# Patient Record
Sex: Male | Born: 1965
Health system: Southern US, Community
[De-identification: ages and names within clinical notes are randomized; demographics above are authoritative.]

## PROBLEM LIST (undated history)

## (undated) DIAGNOSIS — I4891 Unspecified atrial fibrillation: Secondary | ICD-10-CM

## (undated) DIAGNOSIS — M419 Scoliosis, unspecified: Secondary | ICD-10-CM

## (undated) DIAGNOSIS — F32A Depression, unspecified: Secondary | ICD-10-CM

## (undated) DIAGNOSIS — F1020 Alcohol dependence, uncomplicated: Secondary | ICD-10-CM

## (undated) DIAGNOSIS — K219 Gastro-esophageal reflux disease without esophagitis: Secondary | ICD-10-CM

## (undated) DIAGNOSIS — F4310 Post-traumatic stress disorder, unspecified: Secondary | ICD-10-CM

## (undated) DIAGNOSIS — F319 Bipolar disorder, unspecified: Secondary | ICD-10-CM

## (undated) DIAGNOSIS — J449 Chronic obstructive pulmonary disease, unspecified: Secondary | ICD-10-CM

## (undated) DIAGNOSIS — F329 Major depressive disorder, single episode, unspecified: Secondary | ICD-10-CM

## (undated) DIAGNOSIS — IMO0001 Reserved for inherently not codable concepts without codable children: Secondary | ICD-10-CM

## (undated) DIAGNOSIS — M1990 Unspecified osteoarthritis, unspecified site: Secondary | ICD-10-CM

## (undated) HISTORY — PX: APPENDECTOMY: SHX54

---

## 2001-01-05 ENCOUNTER — Encounter: Payer: Self-pay | Admitting: Emergency Medicine

## 2001-01-05 ENCOUNTER — Emergency Department (HOSPITAL_COMMUNITY): Admission: EM | Admit: 2001-01-05 | Discharge: 2001-01-05 | Payer: Self-pay | Admitting: Emergency Medicine

## 2005-02-14 ENCOUNTER — Ambulatory Visit: Payer: Self-pay | Admitting: Gastroenterology

## 2012-06-23 ENCOUNTER — Encounter (HOSPITAL_COMMUNITY): Payer: Self-pay

## 2012-06-23 ENCOUNTER — Emergency Department (HOSPITAL_COMMUNITY)
Admission: EM | Admit: 2012-06-23 | Discharge: 2012-06-23 | Disposition: A | Discharge status: Home / Self Care | Payer: Worker's Compensation | Attending: Emergency Medicine | Admitting: Emergency Medicine

## 2012-06-23 DIAGNOSIS — K219 Gastro-esophageal reflux disease without esophagitis: Secondary | ICD-10-CM | POA: Insufficient documentation

## 2012-06-23 DIAGNOSIS — F17200 Nicotine dependence, unspecified, uncomplicated: Secondary | ICD-10-CM | POA: Insufficient documentation

## 2012-06-23 DIAGNOSIS — X58XXXA Exposure to other specified factors, initial encounter: Secondary | ICD-10-CM | POA: Insufficient documentation

## 2012-06-23 DIAGNOSIS — F1010 Alcohol abuse, uncomplicated: Secondary | ICD-10-CM | POA: Insufficient documentation

## 2012-06-23 DIAGNOSIS — T782XXA Anaphylactic shock, unspecified, initial encounter: Secondary | ICD-10-CM | POA: Insufficient documentation

## 2012-06-23 DIAGNOSIS — Y9289 Other specified places as the place of occurrence of the external cause: Secondary | ICD-10-CM | POA: Insufficient documentation

## 2012-06-23 DIAGNOSIS — Z9089 Acquired absence of other organs: Secondary | ICD-10-CM | POA: Insufficient documentation

## 2012-06-23 DIAGNOSIS — Z8739 Personal history of other diseases of the musculoskeletal system and connective tissue: Secondary | ICD-10-CM | POA: Insufficient documentation

## 2012-06-23 HISTORY — DX: Scoliosis, unspecified: M41.9

## 2012-06-23 HISTORY — DX: Gastro-esophageal reflux disease without esophagitis: K21.9

## 2012-06-23 HISTORY — DX: Reserved for inherently not codable concepts without codable children: IMO0001

## 2012-06-23 HISTORY — DX: Alcohol dependence, uncomplicated: F10.20

## 2012-06-23 HISTORY — DX: Unspecified osteoarthritis, unspecified site: M19.90

## 2012-06-23 LAB — CBC
HCT: 52.7 % — ABNORMAL HIGH (ref 39.0–52.0)
MCH: 35.4 pg — ABNORMAL HIGH (ref 26.0–34.0)
MCV: 96.2 fL (ref 78.0–100.0)
Platelets: 268 10*3/uL (ref 150–400)
RBC: 5.48 MIL/uL (ref 4.22–5.81)
WBC: 17.5 10*3/uL — ABNORMAL HIGH (ref 4.0–10.5)

## 2012-06-23 LAB — BASIC METABOLIC PANEL
BUN: 19 mg/dL (ref 6–23)
CO2: 28 mEq/L (ref 19–32)
Calcium: 9.3 mg/dL (ref 8.4–10.5)
Chloride: 103 mEq/L (ref 96–112)
Creatinine, Ser: 1.29 mg/dL (ref 0.50–1.35)
Glucose, Bld: 138 mg/dL — ABNORMAL HIGH (ref 70–99)

## 2012-06-23 MED ORDER — METHYLPREDNISOLONE SODIUM SUCC 125 MG IJ SOLR
125.0000 mg | Freq: Once | INTRAMUSCULAR | Status: AC
  Administered 2012-06-23: 125 mg via INTRAVENOUS
  Filled 2012-06-23: qty 2

## 2012-06-23 MED ORDER — SODIUM CHLORIDE 0.9 % IV SOLN: INTRAVENOUS | Status: DC

## 2012-06-23 MED ORDER — DIPHENHYDRAMINE HCL 50 MG/ML IJ SOLN
25.0000 mg | Freq: Once | INTRAMUSCULAR | Status: AC
  Administered 2012-06-23: 25 mg via INTRAVENOUS

## 2012-06-23 MED ORDER — EPINEPHRINE 0.3 MG/0.3ML IJ DEVI
INTRAMUSCULAR | Status: AC
  Filled 2012-06-23: qty 0.3

## 2012-06-23 MED ORDER — FAMOTIDINE IN NACL 20-0.9 MG/50ML-% IV SOLN
20.0000 mg | Freq: Once | INTRAVENOUS | Status: AC
  Administered 2012-06-23: 20 mg via INTRAVENOUS
  Filled 2012-06-23: qty 50

## 2012-06-23 MED ORDER — DIPHENHYDRAMINE HCL 50 MG/ML IJ SOLN
INTRAMUSCULAR | Status: AC
  Filled 2012-06-23: qty 1

## 2012-06-23 MED ORDER — SODIUM CHLORIDE 0.9 % IV BOLUS (SEPSIS)
1000.0000 mL | Freq: Once | INTRAVENOUS | Status: AC
  Administered 2012-06-23: 1000 mL via INTRAVENOUS

## 2012-06-23 MED ORDER — EPINEPHRINE 0.3 MG/0.3ML IJ DEVI: 0.3000 mg | Freq: Once | INTRAMUSCULAR | Status: DC

## 2012-06-23 MED ORDER — MELOXICAM 7.5 MG PO TABS: 7.5000 mg | ORAL_TABLET | Freq: Every day | ORAL | Status: AC

## 2012-06-23 MED ORDER — EPINEPHRINE 0.3 MG/0.3ML IJ DEVI
0.3000 mg | Freq: Once | INTRAMUSCULAR | Status: AC
  Administered 2012-06-23: 0.3 mg via INTRAMUSCULAR

## 2012-06-23 MED ORDER — HYDROMORPHONE HCL PF 2 MG/ML IJ SOLN: 2.0000 mg | Freq: Once | INTRAMUSCULAR | Status: DC

## 2012-06-23 NOTE — ED Provider Notes (Signed)
Patient seen and evaluated with resident. Pertinent history and physical examination performed. Agree with initial assessment, evaluation and treatment initiated by resident. Face-to-face examination and review of evaluation findings were performed.  History includes- Pt hypotensive, coughing, nausea, and vomiting on admission. Symptoms started after eating a pork sandwich from a vending machine and taking Voltaren. No similar in past.  Physical Includes- patient is alert, and spitting clear saliva, able to talk in full sentences. No evident oral angioedema. Skin with generalized redness.  Initial treatment, ordered: Benadryl, Pepcid, and Solu-Medrol, and epinephrine  EKG-  Date: 06/23/2012  Rate: 86  Rhythm: normal sinus rhythm  QRS Axis: normal  Intervals: normal  ST/T Wave abnormalities: normal  Conduction Disutrbances: normal  Narrative Interpretation:   Old EKG Reviewed: none available  Disposition- Home with treatment for anaphylaxis including epinephrine pen, and Zyrtec Ongoing.    Flint Melter, MD 06/23/12 2019

## 2012-06-23 NOTE — ED Provider Notes (Signed)
History     CSN: [id number]  Arrival date & time 06/23/12  1444   First MD Initiated Contact with Patient 06/23/12 1504      Chief Complaint  Patient presents with  . Fatigue    (Consider location/radiation/quality/duration/timing/severity/associated sxs/prior treatment) HPI Comments: This is a 46 year old man with alcoholism who presents with anaphylaxis.  He was at work today in his usual state of health.  After eating his lunch and taking his diclofenac tablet around 13:00, he then became flushed and felt hot about 30 minutes later.  He went to the bathroom to have a BM and broke out into a drenching sweat and started feeling his throat tighten.  He called 911 and EMS brought him to the ED.  Here in the ED, he was given IM epinephrine, IV diphenhydramine, IV famotidine and IV methylprednisolone.  After this, he began feeling better and his throat tightness subsided.  Associated with this episode was "abdominal fullness" that prompted him to go the bathroom.  He denied chest pain, recent infections, sinus congestion, and worsening cough.  He has taken diclofenac off an on for a while now, but he reports a new exposure at work.  About a year ago, his work started using a new style of rubber gloves that are blue.  He started breaking out in "splotches" after using these gloves.  He started self medicating with Zyrtec which prevented these rashes.   Past Medical History  Diagnosis Date  . Arthritis   . Alcohol abuse   . GERD (gastroesophageal reflux disease)     Past Surgical History  Procedure Date  . Appendectomy     History reviewed. No pertinent family history.  History  Substance Use Topics  . Smoking status: Current Everyday Smoker -- 2.0 packs/day    Types: Cigarettes  . Smokeless tobacco: Not on file  . Alcohol Use: 15.0 oz/week    30 drink(s) per week     reports is an alcoholic      Review of Systems  All other systems reviewed and are  negative.    Allergies  Review of patient's allergies indicates no known allergies.  Home Medications   Current Outpatient Rx  Name Route Sig Dispense Refill  . DICLOFENAC SODIUM 75 MG PO TBEC Oral Take 75 mg by mouth 2 (two) times daily.      BP 97/64  Pulse 86  Temp 97.4 F (36.3 C) (Axillary)  Resp 18  SpO2 95%  Physical Exam  Constitutional: He appears well-developed and well-nourished. He appears distressed.  HENT:  Head: Normocephalic and atraumatic.  Nose: Nose normal.  Mouth/Throat: Uvula is midline, oropharynx is clear and moist and mucous membranes are normal.  Eyes: Conjunctivae and EOM are normal. Pupils are equal, round, and reactive to light.  Neck: Neck supple.  Cardiovascular: Normal rate and regular rhythm.   Pulmonary/Chest: Effort normal and breath sounds normal. He has no wheezes.  Abdominal: Soft. Normal appearance. He exhibits no mass. There is no hepatosplenomegaly. There is no tenderness.  Lymphadenopathy:    He has no cervical adenopathy.  Neurological: He is alert. He has normal strength. No cranial nerve deficit or sensory deficit.  Skin: Skin is intact.       Hot, erythematous    ED Course  Procedures (including critical care time)  16:20 - pt reevaluated, states he is feeling much better.  Throat tightness has resolved and rash is improving.  Lungs are clear.  17:52 - pt reevaluated.  Pt tired but feels well.  Rash now resolved.   21:00 - pt reevaluated.  Pt feels great.  Reviewed discharged instructions and medications.  Reviewed proper use of EpiPen.  Labs Reviewed  CBC - Abnormal; Notable for the following:    WBC 17.5 (*)     Hemoglobin 19.4 (*)     HCT 52.7 (*)     MCH 35.4 (*)     MCHC 36.8 (*)     All other components within normal limits  BASIC METABOLIC PANEL - Abnormal; Notable for the following:    Glucose, Bld 138 (*)     GFR calc non Af Amer 66 (*)     GFR calc Af Amer 76 (*)     All other components within normal  limits   No results found.   1. Anaphylactic shock     EKG Results: Rate:  86 PR:  164 QRS:  74 QTc:  469 EKG: normal EKG, normal sinus rhythm, there are no previous tracings available for comparison.   MDM  1.  Anaphylaxis:  Sudden onset hot flash, diaphoresis, and throat tightness best explained by anaphylactic reaction.  Septic shock would present more gradually with signs and symptoms of infection preceding.  Cardiogenic and hypovolemic shock would not explain the peripheral vasodilation.  Also, the patient has no history of cardiac disease and had not symptoms of chest pain preceding of during this event.  Heat stroke was also considered, but the sudden onset and sensation of throat tightness and dyspnea make this unlikely.  MSG reaction has many characteristics in common with this patient's presentation, but hypotension is not characteristic.  Patient was hemodynamically stable with no further intervention and safe for discharge.  We will have him continue taking Zytrec daily and switch from Voltaren to Mobic.  We will also provide him with an EpiPen rx.  Lollie Sails, MD 06/23/12 2106

## 2012-06-23 NOTE — ED Notes (Signed)
Patient denies any complaints at present.  Speaking in full sentences without difficulty.  No rash or hives noted.  Continue to observe patient - patient remain on ED monitors at present.

## 2012-06-23 NOTE — ED Notes (Signed)
States he had an allergic reaction at work today. Throat swollen, dizzy, nausea, diaphoretic. States he laid down on floor.

## 2012-06-23 NOTE — ED Notes (Addendum)
Pt with sudden onset of weakness, diaphoretic, stomach pain, hypotension, and mild nausea while at work, happened 30 mins after taking voltaren

## 2013-07-16 ENCOUNTER — Emergency Department (HOSPITAL_COMMUNITY)
Admission: EM | Admit: 2013-07-16 | Discharge: 2013-07-18 | Disposition: A | Discharge status: Other Acute Inpatient Hospital | Payer: 59 | Attending: Emergency Medicine | Admitting: Emergency Medicine

## 2013-07-16 ENCOUNTER — Encounter (HOSPITAL_COMMUNITY): Payer: Self-pay | Admitting: *Deleted

## 2013-07-16 DIAGNOSIS — F329 Major depressive disorder, single episode, unspecified: Secondary | ICD-10-CM | POA: Diagnosis present

## 2013-07-16 DIAGNOSIS — Z9289 Personal history of other medical treatment: Secondary | ICD-10-CM

## 2013-07-16 DIAGNOSIS — R062 Wheezing: Secondary | ICD-10-CM | POA: Insufficient documentation

## 2013-07-16 DIAGNOSIS — R05 Cough: Secondary | ICD-10-CM | POA: Insufficient documentation

## 2013-07-16 DIAGNOSIS — F32A Depression, unspecified: Secondary | ICD-10-CM

## 2013-07-16 DIAGNOSIS — Z79899 Other long term (current) drug therapy: Secondary | ICD-10-CM | POA: Insufficient documentation

## 2013-07-16 DIAGNOSIS — Z8719 Personal history of other diseases of the digestive system: Secondary | ICD-10-CM | POA: Insufficient documentation

## 2013-07-16 DIAGNOSIS — F17200 Nicotine dependence, unspecified, uncomplicated: Secondary | ICD-10-CM | POA: Insufficient documentation

## 2013-07-16 DIAGNOSIS — Z8739 Personal history of other diseases of the musculoskeletal system and connective tissue: Secondary | ICD-10-CM | POA: Insufficient documentation

## 2013-07-16 DIAGNOSIS — F1010 Alcohol abuse, uncomplicated: Secondary | ICD-10-CM

## 2013-07-16 DIAGNOSIS — F1020 Alcohol dependence, uncomplicated: Secondary | ICD-10-CM

## 2013-07-16 DIAGNOSIS — R059 Cough, unspecified: Secondary | ICD-10-CM | POA: Insufficient documentation

## 2013-07-16 DIAGNOSIS — R45851 Suicidal ideations: Secondary | ICD-10-CM

## 2013-07-16 MED ORDER — LORAZEPAM 1 MG PO TABS
1.0000 mg | ORAL_TABLET | Freq: Three times a day (TID) | ORAL | Status: DC | PRN
  Administered 2013-07-17: 1 mg via ORAL
  Filled 2013-07-16: qty 1

## 2013-07-16 MED ORDER — ALBUTEROL SULFATE HFA 108 (90 BASE) MCG/ACT IN AERS: 2.0000 | INHALATION_SPRAY | RESPIRATORY_TRACT | Status: DC | PRN

## 2013-07-16 MED ORDER — ZOLPIDEM TARTRATE 5 MG PO TABS
5.0000 mg | ORAL_TABLET | Freq: Every evening | ORAL | Status: DC | PRN
  Administered 2013-07-17: 5 mg via ORAL
  Filled 2013-07-16: qty 1

## 2013-07-16 MED ORDER — NICOTINE 21 MG/24HR TD PT24
21.0000 mg | MEDICATED_PATCH | Freq: Every day | TRANSDERMAL | Status: DC
  Administered 2013-07-17 – 2013-07-18 (×2): 21 mg via TRANSDERMAL
  Filled 2013-07-16 (×2): qty 1

## 2013-07-16 MED ORDER — ONDANSETRON HCL 4 MG PO TABS: 4.0000 mg | ORAL_TABLET | Freq: Three times a day (TID) | ORAL | Status: DC | PRN

## 2013-07-16 NOTE — ED Provider Notes (Signed)
CSN: [id number]     Arrival date & time 07/16/13  2330 History   First MD Initiated Contact with Patient 07/16/13 2338     Chief Complaint  Patient presents with  . Medical Clearance   (Consider location/radiation/quality/duration/timing/severity/associated sxs/prior Treatment) Patient is a 47 y.o. male presenting with mental health disorder. The history is provided by the patient. No language interpreter was used.  Mental Health Problem Presenting symptoms: depression and suicidal thoughts   Patient accompanied by:  Law enforcement Degree of incapacity (severity):  Moderate Progression:  Worsening Associated symptoms: no chest pain   Associated symptoms comment:  He presents voluntarily with [employer] for suicidal thoughts and alcohol abuse.    Past Medical History  Diagnosis Date  . Osteoarthritis   . Alcoholism /alcohol abuse   . GERD (gastroesophageal reflux disease)   . Scoliosis    Past Surgical History  Procedure Laterality Date  . Appendectomy     Family History  Problem Relation Age of Onset  . Cancer Mother     bladder  . Cancer Father     mesothelioma   History  Substance Use Topics  . Smoking status: Current Every Day Smoker -- 2.00 packs/day    Types: Cigarettes  . Smokeless tobacco: Not on file  . Alcohol Use: 15.0 oz/week    30 drink(s) per week     Comment: reports is an alcoholic    Review of Systems  Constitutional: Negative for fever and chills.  HENT: Negative.   Respiratory: Positive for cough and wheezing.   Cardiovascular: Negative.  Negative for chest pain.  Gastrointestinal: Negative.   Musculoskeletal: Negative.   Skin: Negative.   Neurological: Negative.   Psychiatric/Behavioral: Positive for suicidal ideas and dysphoric mood.    Allergies  Percocet  Home Medications   Current Outpatient Rx  Name  Route  Sig  Dispense  Refill  . cetirizine (ZYRTEC) 10 MG tablet   Oral   Take 10 mg by mouth daily.         Marland Kitchen EPINEPHrine  (EPI-PEN) 0.3 mg/0.3 mL DEVI   Intramuscular   Inject 0.3 mLs (0.3 mg total) into the muscle once.   1 Device   0    BP 126/79  Pulse 93  Temp(Src) 97.6 F (36.4 C) (Oral)  Resp 16  Ht 5\' 9"  (1.753 m)  Wt 220 lb (99.791 kg)  BMI 32.47 kg/m2  SpO2 96% Physical Exam  Constitutional: He is oriented to person, place, and time. He appears well-developed and well-nourished.  HENT:  Head: Normocephalic.  Neck: Normal range of motion. Neck supple.  Cardiovascular: Normal rate and regular rhythm.   Pulmonary/Chest: Effort normal. He has wheezes.  Abdominal: Soft. Bowel sounds are normal. There is no tenderness. There is no rebound and no guarding.  Musculoskeletal: Normal range of motion.  Neurological: He is alert and oriented to person, place, and time.  Skin: Skin is warm and dry. No rash noted.  Psychiatric: He has a normal mood and affect.    ED Course  Procedures (including critical care time) Labs Review Labs Reviewed  ACETAMINOPHEN LEVEL  CBC  COMPREHENSIVE METABOLIC PANEL  ETHANOL  SALICYLATE LEVEL  URINE RAPID DRUG SCREEN (HOSP PERFORMED)   Imaging Review No results found.  MDM  No diagnosis found. 1. Suicidal ideation 2. Substance abuse  He presents voluntarily for SI, alcohol dependence, requesting help for depression. [facility] to see, pending medical clearance.     Arnoldo Hooker, PA-C 07/17/13 0002

## 2013-07-16 NOTE — ED Notes (Addendum)
Pt presents w/ SI - has multiple plans for suicide to include running into traffic, states in November he lost his job and has been experiencing increasing stressors since then. Pt admits to ETOH tonight, pt brought in by [employer] x2 voluntary at present. Pt admits to homicidal ideations of his ex-girlfriend. Pt pleasant and cooperative on assessment.

## 2013-07-17 ENCOUNTER — Emergency Department (HOSPITAL_COMMUNITY): Payer: Self-pay

## 2013-07-17 DIAGNOSIS — X838XXA Intentional self-harm by other specified means, initial encounter: Secondary | ICD-10-CM

## 2013-07-17 DIAGNOSIS — F1020 Alcohol dependence, uncomplicated: Secondary | ICD-10-CM

## 2013-07-17 DIAGNOSIS — F1994 Other psychoactive substance use, unspecified with psychoactive substance-induced mood disorder: Secondary | ICD-10-CM

## 2013-07-17 LAB — COMPREHENSIVE METABOLIC PANEL
Albumin: 3.5 g/dL (ref 3.5–5.2)
Alkaline Phosphatase: 61 U/L (ref 39–117)
BUN: 8 mg/dL (ref 6–23)
Calcium: 9.2 mg/dL (ref 8.4–10.5)
Creatinine, Ser: 0.97 mg/dL (ref 0.50–1.35)
GFR calc Af Amer: >90 mL/min (ref >90)
Glucose, Bld: 105 mg/dL — ABNORMAL HIGH (ref 70–99)
Total Protein: 6.7 g/dL (ref 6.0–8.3)

## 2013-07-17 LAB — CBC
HCT: 46.5 % (ref 39.0–52.0)
Hemoglobin: 17.2 g/dL — ABNORMAL HIGH (ref 13.0–17.0)
MCH: 36.4 pg — ABNORMAL HIGH (ref 26.0–34.0)
MCHC: 37 g/dL — ABNORMAL HIGH (ref 30.0–36.0)
MCV: 98.5 fL (ref 78.0–100.0)
RDW: 12.9 % (ref 11.5–15.5)

## 2013-07-17 LAB — SALICYLATE LEVEL: Salicylate Lvl: <2 mg/dL — ABNORMAL LOW (ref 2.8–20.0)

## 2013-07-17 LAB — RAPID URINE DRUG SCREEN, HOSP PERFORMED
Barbiturates: NOT DETECTED
Benzodiazepines: NOT DETECTED
Cocaine: NOT DETECTED

## 2013-07-17 LAB — ETHANOL: Alcohol, Ethyl (B): 93 mg/dL — ABNORMAL HIGH (ref 0–11)

## 2013-07-17 LAB — ACETAMINOPHEN LEVEL: Acetaminophen (Tylenol), Serum: <15 ug/mL (ref 10–30)

## 2013-07-17 MED ORDER — ONDANSETRON 4 MG PO TBDP: 4.0000 mg | ORAL_TABLET | Freq: Four times a day (QID) | ORAL | Status: DC | PRN

## 2013-07-17 MED ORDER — CHLORDIAZEPOXIDE HCL 25 MG PO CAPS
25.0000 mg | ORAL_CAPSULE | Freq: Four times a day (QID) | ORAL | Status: DC
  Administered 2013-07-17: 25 mg via ORAL
  Filled 2013-07-17: qty 1

## 2013-07-17 MED ORDER — CHLORDIAZEPOXIDE HCL 25 MG PO CAPS: 25.0000 mg | ORAL_CAPSULE | Freq: Three times a day (TID) | ORAL | Status: DC

## 2013-07-17 MED ORDER — CHLORDIAZEPOXIDE HCL 25 MG PO CAPS: 25.0000 mg | ORAL_CAPSULE | Freq: Every day | ORAL | Status: DC

## 2013-07-17 MED ORDER — THIAMINE HCL 100 MG/ML IJ SOLN
100.0000 mg | Freq: Once | INTRAMUSCULAR | Status: AC
  Administered 2013-07-17: 100 mg via INTRAMUSCULAR
  Filled 2013-07-17: qty 2

## 2013-07-17 MED ORDER — VITAMIN B-1 100 MG PO TABS
100.0000 mg | ORAL_TABLET | Freq: Every day | ORAL | Status: DC
  Administered 2013-07-18: 10:00:00 100 mg via ORAL
  Filled 2013-07-17: qty 1

## 2013-07-17 MED ORDER — LOPERAMIDE HCL 2 MG PO CAPS: 2.0000 mg | ORAL_CAPSULE | ORAL | Status: DC | PRN

## 2013-07-17 MED ORDER — CHLORDIAZEPOXIDE HCL 25 MG PO CAPS: 25.0000 mg | ORAL_CAPSULE | Freq: Four times a day (QID) | ORAL | Status: DC | PRN

## 2013-07-17 MED ORDER — PANTOPRAZOLE SODIUM 40 MG PO TBEC
40.0000 mg | DELAYED_RELEASE_TABLET | Freq: Every day | ORAL | Status: DC
  Administered 2013-07-17 – 2013-07-18 (×2): 40 mg via ORAL
  Filled 2013-07-17 (×2): qty 1

## 2013-07-17 MED ORDER — GUAIFENESIN 100 MG/5ML PO SYRP
200.0000 mg | ORAL_SOLUTION | ORAL | Status: DC | PRN
  Administered 2013-07-17 – 2013-07-18 (×3): 200 mg via ORAL
  Filled 2013-07-17 (×2): qty 10

## 2013-07-17 MED ORDER — LORATADINE 10 MG PO TABS
10.0000 mg | ORAL_TABLET | Freq: Every day | ORAL | Status: DC
  Administered 2013-07-17 – 2013-07-18 (×2): 10 mg via ORAL
  Filled 2013-07-17 (×2): qty 1

## 2013-07-17 MED ORDER — CHLORDIAZEPOXIDE HCL 25 MG PO CAPS: 25.0000 mg | ORAL_CAPSULE | ORAL | Status: DC

## 2013-07-17 MED ORDER — HYDROXYZINE HCL 25 MG PO TABS: 25.0000 mg | ORAL_TABLET | Freq: Four times a day (QID) | ORAL | Status: DC | PRN

## 2013-07-17 MED ORDER — ADULT MULTIVITAMIN W/MINERALS CH
1.0000 | ORAL_TABLET | Freq: Every day | ORAL | Status: DC
  Administered 2013-07-17 – 2013-07-18 (×2): 1 via ORAL
  Filled 2013-07-17 (×2): qty 1

## 2013-07-17 MED ORDER — CHLORDIAZEPOXIDE HCL 25 MG PO CAPS
25.0000 mg | ORAL_CAPSULE | Freq: Once | ORAL | Status: AC
  Administered 2013-07-17: 25 mg via ORAL
  Filled 2013-07-17: qty 1

## 2013-07-17 NOTE — BH Assessment (Addendum)
Pt is effectively self-pay because workman's comp will not pay for alcohol detox. [facility] has exhausted funds for [employer] for August and [facility] will not authorize RTS without [facility] staff enrolling Pt in their Alpha system and [facility] staff, who have access to Alpha, is unavailable on weekends. Pt is pending bed at [facility].  Harlin Rain Ria Comment, [facility] Triage Specialist

## 2013-07-17 NOTE — Consult Note (Signed)
Telepsych Consultation   Reason for Consult: ED referral Referring Physician:  ED providers Dalton Molina is an 47 y.o. male.  Assessment: AXIS I:  alcohol dependence syndrome;SIMD;Suicide attempt AXIS II:  Deferred (child of alcoholic-?dependent personality) AXIS III:   Past Medical History  Diagnosis Date  . Osteoarthritis   . Alcoholism /alcohol abuse   . GERD (gastroesophageal reflux disease)   . Scoliosis    AXIS IV:  occupational problems AXIS V:  21-30 behavior considerably influenced by delusions or hallucinations OR serious impairment in judgment, communication OR inability to function in almost all areas  Plan:  Recommend psychiatric Inpatient admission when medically cleared.  Subjective:   Dalton Molina is a 47 y.o. male patient admitted with hx of alcohol dependence and suicide attempt.  HPI: See ED Provider note HPI Elements:   Severity:  tried to hang himself in Triage room.  Past Psychiatric History: Past Medical History  Diagnosis Date  . Osteoarthritis   . Alcoholism /alcohol abuse   . GERD (gastroesophageal reflux disease)   . Scoliosis     reports that he has been smoking Cigarettes.  He has been smoking about 2.00 packs per day. He does not have any smokeless tobacco history on file. He reports that he drinks about 15.0 ounces of alcohol per week. He reports that he does not use illicit drugs. Family History  Problem Relation Age of Onset  . Cancer Mother     bladder  . Cancer Father     mesothelioma         Allergies:   Allergies  Allergen Reactions  . Percocet [Oxycodone-Acetaminophen] Anaphylaxis and Rash    ACT Assessment Complete:  No:   Past Psychiatric History: Diagnosis:  Alcohol dependence syndrome ;Substance induced mood disorder;Suicide attempt;Family history of alcoholism  Hospitalizations: 2 prior attempts/Detox at [facility] recently  Outpatient Care:  None  Substance Abuse Care:  Detox only-uninsured and unable to get into  longer term treatment programs "I guess they were full"  Self-Mutilation:  None reported  Suicidal Attempts: x 3  Homicidal Behaviors:  None   Violent Behaviors:  None reported   Place of Residence:  Homeless Marital Status:  Never married/no children Employed/Unemployed:  Unemployed x 5 yrs-lost job of 15 yrs due to drinkinhg when his father died Education:  HS Family Supports:  Denies-says he is alone-"thet've all died now" Objective: Blood pressure 111/75, pulse 83, temperature 98.4 F (36.9 C), temperature source Oral, resp. rate 20, height 5\' 9"  (1.753 m), weight 99.791 kg (220 lb), SpO2 93.00%.Body mass index is 32.47 kg/(m^2). Results for orders placed during the hospital encounter of 07/16/13 (from the past 72 hour(s))  ACETAMINOPHEN LEVEL     Status: None   Collection Time    07/17/13 12:08 AM      Result Value Range   Acetaminophen (Tylenol), Serum <15.0  10 - 30 ug/mL   Comment:            THERAPEUTIC CONCENTRATIONS VARY     SIGNIFICANTLY. A RANGE OF 10-30     ug/mL MAY BE AN EFFECTIVE     CONCENTRATION FOR MANY PATIENTS.     HOWEVER, SOME ARE BEST TREATED     AT CONCENTRATIONS OUTSIDE THIS     RANGE.     ACETAMINOPHEN CONCENTRATIONS     >150 ug/mL AT 4 HOURS AFTER     INGESTION AND >50 ug/mL AT 12     HOURS AFTER INGESTION ARE     OFTEN ASSOCIATED WITH  TOXIC     REACTIONS.  CBC     Status: Abnormal   Collection Time    07/17/13 12:08 AM      Result Value Range   WBC 9.7  4.0 - 10.5 K/uL   RBC 4.72  4.22 - 5.81 MIL/uL   Hemoglobin 17.2 (*) 13.0 - 17.0 g/dL   HCT 16.1  09.6 - 04.5 %   MCV 98.5  78.0 - 100.0 fL   MCH 36.4 (*) 26.0 - 34.0 pg   MCHC 37.0 (*) 30.0 - 36.0 g/dL   RDW 40.9  81.1 - 91.4 %   Platelets 236  150 - 400 K/uL  COMPREHENSIVE METABOLIC PANEL     Status: Abnormal   Collection Time    07/17/13 12:08 AM      Result Value Range   Sodium 137  135 - 145 mEq/L   Potassium 3.3 (*) 3.5 - 5.1 mEq/L   Chloride 103  96 - 112 mEq/L   CO2 23  19 -  32 mEq/L   Glucose, Bld 105 (*) 70 - 99 mg/dL   BUN 8  6 - 23 mg/dL   Creatinine, Ser 7.82  0.50 - 1.35 mg/dL   Calcium 9.2  8.4 - 95.6 mg/dL   Total Protein 6.7  6.0 - 8.3 g/dL   Albumin 3.5  3.5 - 5.2 g/dL   AST 213 (*) 0 - 37 U/L   ALT 89 (*) 0 - 53 U/L   Alkaline Phosphatase 61  39 - 117 U/L   Total Bilirubin 0.5  0.3 - 1.2 mg/dL   GFR calc non Af Amer >90  >90 mL/min   GFR calc Af Amer >90  >90 mL/min   Comment: (NOTE)     The eGFR has been calculated using the CKD EPI equation.     This calculation has not been validated in all clinical situations.     eGFR's persistently <90 mL/min signify possible Chronic Kidney     Disease.  ETHANOL     Status: Abnormal   Collection Time    07/17/13 12:08 AM      Result Value Range   Alcohol, Ethyl (B) 93 (*) 0 - 11 mg/dL   Comment:            LOWEST DETECTABLE LIMIT FOR     SERUM ALCOHOL IS 11 mg/dL     FOR MEDICAL PURPOSES ONLY  SALICYLATE LEVEL     Status: Abnormal   Collection Time    07/17/13 12:08 AM      Result Value Range   Salicylate Lvl <2.0 (*) 2.8 - 20.0 mg/dL  URINE RAPID DRUG SCREEN (HOSP PERFORMED)     Status: None   Collection Time    07/17/13 12:43 AM      Result Value Range   Opiates NONE DETECTED  NONE DETECTED   Cocaine NONE DETECTED  NONE DETECTED   Benzodiazepines NONE DETECTED  NONE DETECTED   Amphetamines NONE DETECTED  NONE DETECTED   Tetrahydrocannabinol NONE DETECTED  NONE DETECTED   Barbiturates NONE DETECTED  NONE DETECTED   Comment:            DRUG SCREEN FOR MEDICAL PURPOSES     ONLY.  IF CONFIRMATION IS NEEDED     FOR ANY PURPOSE, NOTIFY LAB     WITHIN 5 DAYS.                LOWEST DETECTABLE LIMITS     FOR URINE  DRUG SCREEN     Drug Class       Cutoff (ng/mL)     Amphetamine      1000     Barbiturate      200     Benzodiazepine   200     Tricyclics       300     Opiates          300     Cocaine          300     THC              50   Labs are reviewed and are pertinent for ^ LFTs  consistent with alcoholic hepatitis/ETOH . 093.  Current Facility-Administered Medications  Medication Dose Route Frequency Provider Last Rate Last Dose  . albuterol (PROVENTIL HFA;VENTOLIN HFA) 108 (90 BASE) MCG/ACT inhaler 2 puff  2 puff Inhalation Q4H PRN Shari A Upstill, PA-C      . LORazepam (ATIVAN) tablet 1 mg  1 mg Oral Q8H PRN Shari A Upstill, PA-C   1 mg at 07/17/13 0200  . nicotine (NICODERM CQ - dosed in mg/24 hours) patch 21 mg  21 mg Transdermal Daily Shari A Upstill, PA-C      . ondansetron (ZOFRAN) tablet 4 mg  4 mg Oral Q8H PRN Shari A Upstill, PA-C      . zolpidem (AMBIEN) tablet 5 mg  5 mg Oral QHS PRN Shari A Upstill, PA-C   5 mg at 07/17/13 0200   Current Outpatient Prescriptions  Medication Sig Dispense Refill  . cetirizine (ZYRTEC) 10 MG tablet Take 10 mg by mouth daily as needed for allergies.       Marland Kitchen EPINEPHrine (EPI-PEN) 0.3 mg/0.3 mL DEVI Inject 0.3 mLs (0.3 mg total) into the muscle once.  1 Device  0    Psychiatric Specialty Exam:     Blood pressure 111/75, pulse 83, temperature 98.4 F (36.9 C), temperature source Oral, resp. rate 20, height 5\' 9"  (1.753 m), weight 99.791 kg (220 lb), SpO2 93.00%.Body mass index is 32.47 kg/(m^2).  General Appearance: Disheveled  Eye Solicitor::  Fair  Speech:  Clear and Coherent  Volume:  Decreased  Mood:  Depressed  Affect:  Congruent  Thought Process:  Coherent  Orientation:  Full (Time, Place, and Person)  Thought Content:  Obsessions/delusions all around alcohol and its consequences in his life  Suicidal Thoughts:  Yes.  with intent/plan  Homicidal Thoughts:  No  Memory:  Immediate;   Good  Judgement:  Poor  Insight:  Shallow  Psychomotor Activity:  NA  Concentration:  Poor  Recall:  Fair  Akathisia:  NA  Handed:  Right  AIMS (if indicated):     Assets:  Desire for Improvement  Sleep:   impaired due to ETOH   Treatment Plan Summary: ETOH detox followed by [facility] inpt program  Disposition:  Await  bed  Gannon Heinzman E 07/17/2013 3:52 AM

## 2013-07-17 NOTE — ED Notes (Signed)
Pt requesting ginger ale. Pt was pleasant and calm when speaking and showed no signs of distress.

## 2013-07-17 NOTE — BH Assessment (Addendum)
BHH Assessment Progress Note  Update @ 1058:  Received call from [facility] stating no beds available @ 1022.  Received call from [facility] requesting an EKG.  This relayed to pt's nurse.  However, received another call from [facility] stating pt declined by Dr. Carroll Sage due to acuity. No beds at [facility] per [place] @ 1127.  Called [facility] again @ 1157, no answer.  Referral faxed.  Called [facility] and beds available per [facility] @ 1158.  Referral faxed for review.  Called [facility] again and per [employer] [facility] @ 1159, no beds available.  Also, received call from [facility] and no beds available @ 1200.    Update: Called following facilities for placement consideration:  - [facility] - no answer @ 0820  - [facility] - no answer @ [phone]  [facility] - Left message @ [phone]  - [facility] - No beds per [facility] @ 0827  - [facility] - 0830 - Beds per [facility]le Bosworth - Referral faxed for review  - [facility] - May have beds after discharges and fax for review per [facility] @ 4782 - Referral faxed for review  - [facility] - Beds per [place] @ [phone] - Referral faxed for review  - [facility] - May have beds per Susie and fax for review @ [phone] - Referral faxed for review  - [facility] - Left message @ [phone]  TTS staff will continue to attempt to find placement for pt.

## 2013-07-17 NOTE — ED Notes (Signed)
Pt transferred from main ed, presents SI with attempt to hang self with cord in triage.  Reports he lost his job of 15 yrs and doesn't want to live anymore.  Pt reports he drinks liquor or beer all day.  Denies drug use.  Pt states diag. With Bipolar in the past, feeling hopeless.  Pt also reports he sees Elvis and UFO's.

## 2013-07-17 NOTE — ED Notes (Signed)
Discussed keeping the door open due to him trying to hang himself with a cord in triage. Stated understanding. Discussed suicidal thoughts and contracting for safety. Currently denies SI and does contract for safety.

## 2013-07-17 NOTE — Consult Note (Signed)
Patient is seen awake, alert and oriented x4.  He reports depressed mood and his affect is congruent.  He states he is here for detox from alcohol and cocaine.  Patient is emotional and tearful repeating" I lost everything"  Patient states he is here to get detox from alcohol and cocaine.  Patient is still endorsing SI and states " If I leave here without treatment I don't know what I will do to myself"  He denies HI/AVH but he also states" I do hear and see things when I am under the influence of something or when I am withdrawing"  We will continue to monitor patient, we will continue to detox him with our librium protocol.  For his c/o abdominal discomfort and gastric indigestion, we will start patient on Protonix and give him cough syrup for his dry hacking cough. We will seek for placement at any facility while we plan to admit him here when bed is available. Dahlia Byes   PMHNP-BC

## 2013-07-18 ENCOUNTER — Inpatient Hospital Stay (HOSPITAL_COMMUNITY)
Admission: EM | Admit: 2013-07-18 | Discharge: 2013-07-22 | DRG: 897 | Disposition: A | Discharge status: Home / Self Care | Payer: 59 | Source: Intra-hospital | Attending: Psychiatry | Admitting: Psychiatry

## 2013-07-18 ENCOUNTER — Encounter (HOSPITAL_COMMUNITY): Payer: Self-pay | Admitting: *Deleted

## 2013-07-18 ENCOUNTER — Encounter (HOSPITAL_COMMUNITY): Payer: Self-pay | Admitting: Registered Nurse

## 2013-07-18 DIAGNOSIS — F1010 Alcohol abuse, uncomplicated: Secondary | ICD-10-CM | POA: Diagnosis present

## 2013-07-18 DIAGNOSIS — K219 Gastro-esophageal reflux disease without esophagitis: Secondary | ICD-10-CM | POA: Diagnosis present

## 2013-07-18 DIAGNOSIS — Z79899 Other long term (current) drug therapy: Secondary | ICD-10-CM

## 2013-07-18 DIAGNOSIS — F329 Major depressive disorder, single episode, unspecified: Secondary | ICD-10-CM | POA: Diagnosis present

## 2013-07-18 DIAGNOSIS — F1020 Alcohol dependence, uncomplicated: Secondary | ICD-10-CM

## 2013-07-18 DIAGNOSIS — F10229 Alcohol dependence with intoxication, unspecified: Principal | ICD-10-CM | POA: Diagnosis present

## 2013-07-18 DIAGNOSIS — Z9289 Personal history of other medical treatment: Secondary | ICD-10-CM

## 2013-07-18 MED ORDER — NICOTINE 21 MG/24HR TD PT24
21.0000 mg | MEDICATED_PATCH | Freq: Every day | TRANSDERMAL | Status: DC
  Administered 2013-07-19 – 2013-07-22 (×4): 21 mg via TRANSDERMAL
  Filled 2013-07-18 (×8): qty 1

## 2013-07-18 MED ORDER — ONDANSETRON 4 MG PO TBDP: 4.0000 mg | ORAL_TABLET | Freq: Four times a day (QID) | ORAL | Status: AC | PRN

## 2013-07-18 MED ORDER — CHLORDIAZEPOXIDE HCL 25 MG PO CAPS
25.0000 mg | ORAL_CAPSULE | ORAL | Status: AC
  Administered 2013-07-21 (×2): 25 mg via ORAL
  Filled 2013-07-18 (×2): qty 1

## 2013-07-18 MED ORDER — VITAMIN B-1 100 MG PO TABS
100.0000 mg | ORAL_TABLET | Freq: Every day | ORAL | Status: DC
  Administered 2013-07-19 – 2013-07-22 (×4): 100 mg via ORAL
  Filled 2013-07-18 (×6): qty 1

## 2013-07-18 MED ORDER — ALBUTEROL SULFATE HFA 108 (90 BASE) MCG/ACT IN AERS: 2.0000 | INHALATION_SPRAY | RESPIRATORY_TRACT | Status: DC | PRN

## 2013-07-18 MED ORDER — MAGNESIUM HYDROXIDE 400 MG/5ML PO SUSP: 30.0000 mL | Freq: Every day | ORAL | Status: DC | PRN

## 2013-07-18 MED ORDER — CHLORDIAZEPOXIDE HCL 25 MG PO CAPS
25.0000 mg | ORAL_CAPSULE | Freq: Every day | ORAL | Status: AC
  Administered 2013-07-22: 25 mg via ORAL
  Filled 2013-07-18: qty 1

## 2013-07-18 MED ORDER — DOCUSATE SODIUM 100 MG PO CAPS
100.0000 mg | ORAL_CAPSULE | Freq: Two times a day (BID) | ORAL | Status: DC
  Administered 2013-07-18: 100 mg via ORAL
  Filled 2013-07-18 (×2): qty 1

## 2013-07-18 MED ORDER — HYDROXYZINE HCL 25 MG PO TABS: 25.0000 mg | ORAL_TABLET | Freq: Four times a day (QID) | ORAL | Status: AC | PRN

## 2013-07-18 MED ORDER — HYDROCORTISONE ACETATE 25 MG RE SUPP
25.0000 mg | Freq: Two times a day (BID) | RECTAL | Status: DC | PRN
  Administered 2013-07-18: 25 mg via RECTAL
  Filled 2013-07-18: qty 1

## 2013-07-18 MED ORDER — CHLORDIAZEPOXIDE HCL 25 MG PO CAPS
25.0000 mg | ORAL_CAPSULE | Freq: Once | ORAL | Status: AC
  Administered 2013-07-18: 25 mg via ORAL
  Filled 2013-07-18: qty 1

## 2013-07-18 MED ORDER — CHLORDIAZEPOXIDE HCL 25 MG PO CAPS
25.0000 mg | ORAL_CAPSULE | Freq: Three times a day (TID) | ORAL | Status: AC
  Administered 2013-07-20 (×3): 25 mg via ORAL
  Filled 2013-07-18 (×3): qty 1

## 2013-07-18 MED ORDER — CHLORDIAZEPOXIDE HCL 25 MG PO CAPS
25.0000 mg | ORAL_CAPSULE | Freq: Four times a day (QID) | ORAL | Status: AC
  Administered 2013-07-18 – 2013-07-19 (×6): 25 mg via ORAL
  Filled 2013-07-18 (×6): qty 1

## 2013-07-18 MED ORDER — CHLORDIAZEPOXIDE HCL 25 MG PO CAPS: 25.0000 mg | ORAL_CAPSULE | Freq: Four times a day (QID) | ORAL | Status: AC | PRN

## 2013-07-18 MED ORDER — ADULT MULTIVITAMIN W/MINERALS CH
1.0000 | ORAL_TABLET | Freq: Every day | ORAL | Status: DC
  Administered 2013-07-18 – 2013-07-22 (×5): 1 via ORAL
  Filled 2013-07-18 (×7): qty 1

## 2013-07-18 MED ORDER — TRAZODONE HCL 50 MG PO TABS
50.0000 mg | ORAL_TABLET | Freq: Every evening | ORAL | Status: DC | PRN
  Administered 2013-07-18 – 2013-07-21 (×4): 50 mg via ORAL
  Filled 2013-07-18 (×4): qty 1

## 2013-07-18 MED ORDER — ALUM & MAG HYDROXIDE-SIMETH 200-200-20 MG/5ML PO SUSP: 30.0000 mL | ORAL | Status: DC | PRN

## 2013-07-18 MED ORDER — THIAMINE HCL 100 MG/ML IJ SOLN: 100.0000 mg | Freq: Once | INTRAMUSCULAR | Status: DC

## 2013-07-18 MED ORDER — LOPERAMIDE HCL 2 MG PO CAPS: 2.0000 mg | ORAL_CAPSULE | ORAL | Status: AC | PRN

## 2013-07-18 NOTE — Tx Team (Signed)
Initial Interdisciplinary Treatment Plan  PATIENT STRENGTHS: (choose at least two) Average or above average intelligence Motivation for treatment/growth  PATIENT STRESSORS: Legal issue Substance abuse problems with his significant other    PROBLEM LIST: Problem List/Patient Goals Date to be addressed Date deferred Reason deferred Estimated date of resolution  Polysubstance abuse/dependence 07-18-13           Suicide ideation 07-18-13           homeless 07-18-13                              DISCHARGE CRITERIA:  Improved stabilization in mood, thinking, and/or behavior Reduction of life-threatening or endangering symptoms to within safe limits Withdrawal symptoms are absent or subacute and managed without 24-hour nursing intervention  PRELIMINARY DISCHARGE PLAN: Attend aftercare/continuing care group Attend 12-step recovery group Placement in alternative living arrangements  PATIENT/FAMIILY INVOLVEMENT: This treatment plan has been presented to and reviewed with the patient, Dalton Molina, and/or family member, .  The patient and family have been given the opportunity to ask questions and make suggestions.  Dalton Molina 07/18/2013, 5:51 PM

## 2013-07-18 NOTE — BHH Counselor (Signed)
Following placements have no beds per BHH attempts this AM:  -[facility] - no beds per Lynn @ 0959  -[facility] - no beds per Evelyn @ 0959  -[facility] - no beds per Susie @ 1000  -[facility] - no beds per Lisa @ 1001  -[facility] beds as of now   - [facility] - only male beds per Kim @ 1005  - [facility] - beds @ 1049 - Referral faxed for review  - [facility] - no beds @ 1007  - [facility] - no beds per Katie @ 1003  - [facility] - No beds per Brian @ 1008  - [facility] - Left message @ 1012  - [facility] - No beds per Beth @ 1034  - [facility] - no beds per Kathy @ 1037  - [facility] - 1057 - no beds per Amber   

## 2013-07-18 NOTE — [facility] Group Notes (Signed)
Adult Psychoeducational Group Note  Date:  07/18/2013 Time:  9:15 PM  Group Topic/Focus:  Wrap-Up Group:   The focus of this group is to help patients review their daily goal of treatment and discuss progress on daily workbooks.  Participation Level:  Active  Participation Quality:  Appropriate  Affect:  Appropriate  Cognitive:  Appropriate  Insight: Appropriate  Engagement in Group:  Engaged  Modes of Intervention:  Discussion  Additional Comments:  Cesare expressed that it was his first day here.  He stated that he wanted to get help before but waited until he lost everything.  He also said that he was glad to be in a place with other people who understood what he was going through.  Caroll Rancher A 07/18/2013, 9:15 PM

## 2013-07-18 NOTE — Progress Notes (Signed)
Patient ID: Dalton Molina, male   DOB: 01-Sep-1966, 47 y.o.   MRN: [id number] 07-18-13 @ nursing admission note: pt came to [facility] wanting help with polysubstance abuse/dependence problems. Two weeks ago he was in [facility] for the same issues. He has been using etoh and cocaine, he stated. His uds was negative. The etoh use is ongoing and the cocaine he was using with his now x-girlfriend.  His last drink was on fri and he admits he is a very heavy drinker.His CIWA was 1. He stated he was suicidal while he was impaired, but now denies any si/hi/av and contracted for his safety. His stressors are unemployed, financial issue, relationship issue, and he is a registered sex offender. His medical issues are congestion presently, an appendectomy in 1981, scoliosis and GERD. He has no complaints of pain, allergic to perocet and is a smoker a 2 ppd. He is wearing a patch presently.  This pt is single and has no children. Pt was escorted to the 500 hall and pt oriented to the unit.   Emergency contact Roger Felts at cell # [phone]

## 2013-07-18 NOTE — ED Notes (Signed)
Report called to [facility] RN at [facility]. Accepting Dr.Jonnalagadda to room 501-2. Transported via hospital security and mental health tech. Belongings sent with patient.

## 2013-07-18 NOTE — Consult Note (Signed)
  Follow up consult:  Face to face interview and consulted with Dr. Theotis Barrio  Patient states that he is feeling better. Patient continues to have complaints of depression.  "I have no family left and I just feel hopeless; like there is no where to go."  Patient states that he has been a little sick with cold/cough and is spitting out thick green .  We will continue to monitor patient and provide safety and stability.   Disposition:  Continue with current treatment plan for inpatient treatment.    Shuvon B. Rankin FNP-BC Family Nurse Practitioner, Board Certified

## 2013-07-19 DIAGNOSIS — F1994 Other psychoactive substance use, unspecified with psychoactive substance-induced mood disorder: Secondary | ICD-10-CM

## 2013-07-19 DIAGNOSIS — F10229 Alcohol dependence with intoxication, unspecified: Principal | ICD-10-CM

## 2013-07-19 NOTE — Tx Team (Signed)
  Interdisciplinary Treatment Plan Update   Date Reviewed:  07/19/2013  Time Reviewed:  12:20 PM  Progress in Treatment:   Attending groups: Yes Participating in groups: Yes Taking medication as prescribed: Yes  Tolerating medication: Yes Family/Significant other contact made: No  Patient understands diagnosis: Yes As evidenced by asking for help with alcohol dependence and depression Discussing patient identified problems/goals with staff: Yes  See initial plan Medical problems stabilized or resolved: Yes Denies suicidal/homicidal ideation: Yes In tx team Patient has not harmed self or others: Yes  For review of initial/current patient goals, please see plan of care.  Estimated Length of Stay:  3-5 days  Reason for Continuation of Hospitalization: Depression Medication stabilization Withdrawal symptoms  New Problems/Goals identified:  N/A  Discharge Plan or Barriers:   Hopes to get into a SA program  Additional Comments:  Patient admitted voluntarily, emergently from [facility] for alcohol detox treatment and alcohol related depression. Patient stated that he was called 911 from [place] parking lot where he left his truck and drinking a lot and has no resources any longer, and stated that he has been depressed and wants to die by walking in the traffic. He has been a sex offenders list x 1996, unable to stay on shelter either in [place] and [address] He has lost his job November 2013, sold his home and stayed in [place] along with his GF with crack addiction until two weeks ago. His GF left him and went to [facility] and he knows that she is not a good companion to him but say his judgement is poor.   Attendees:  Signature: Dr Elsie Saas, MD 07/19/2013 12:20 PM   Signature: Richelle Ito, LCSW 07/19/2013 12:20 PM  Signature: Verne Spurr, PA  07/19/2013 12:20 PM  Signature:  07/19/2013 12:20 PM  Signature: Liborio Nixon, RN 07/19/2013 12:20 PM  Signature:  07/19/2013 12:20 PM  Signature:    07/19/2013 12:20 PM  Signature:    Signature:    Signature:    Signature:    Signature:    Signature:      Scribe for Treatment Team:   Richelle Ito, LCSW  07/19/2013 12:20 PM

## 2013-07-19 NOTE — ED Provider Notes (Signed)
Medical screening examination/treatment/procedure(s) were performed by non-physician practitioner and as supervising physician I was immediately available for consultation/collaboration.    Christopher J. Pollina, MD 07/19/13 0727 

## 2013-07-19 NOTE — Progress Notes (Signed)
Patient ID: Dalton Molina, male   DOB: 03/28/1966, 47 y.o.   MRN: [id number] D)  Has been out and about on the hall this evening, attended group and seemed to appreciate being here.  Afterward spoke to him about how he felt, w/d sx, etc.  Stated was having some anxiety, is homeless, stated he really wanted help and has been through detox before.  States the program isn't long enough, that he just gets over craving and then he goes back to the same situation and falls back into the same pattern, would like long term program where he could get his life together but feeling hopeless, that it may never happen.  States has no family and could travel and go where available, states wants to change. A)  Encouraged him to speak with CM and ask what might be available to him.  Will continue to monitor for safety, continue POC R)  safety maintained.
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
## 2013-07-19 NOTE — Progress Notes (Signed)
Adult Psychoeducational Group Note  Date:  07/19/2013 Time:  10:00am  Group Topic/Focus:  Self Care:   The focus of this group is to help patients understand the importance of self-care in order to improve or restore emotional, physical, spiritual, interpersonal, and financial health.  Participation Level:  Active  Participation Quality:  Appropriate and Attentive  Affect:  Appropriate  Cognitive:  Alert and Appropriate  Insight: Appropriate  Engagement in Group:  Engaged  Modes of Intervention:  Discussion and Education  Additional Comments:  Pt attended and participated in group. When ask what self care meant to him pt stated taking care of bills keeping house clean and anything that should come first.  [name] 07/19/2013, 11:12 AM

## 2013-07-19 NOTE — Progress Notes (Signed)
D: Pt denies SI/HI/AVH. Pt reports depression 5/10. Pt present with flat affect. Depressed mood. No withdrawal symptoms observed by writer, none verbalized by pt. Pt stated that he fell in love with a girl four years ago and started drinking heavily and using cocaine. Pt blew over 20, 000 on alcohol and drugs. Pt is currently unemployed and homeless d/t reckless behaviors. A: Medications administered as ordered per MD. Medications reviewed with pt by Clinical research associate. Verbal support given. Pt encouraged to attend groups. . 15 minute checks performed for safety. R: Pt receptive to treatment. Pt remains safe at this time.
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
## 2013-07-20 DIAGNOSIS — F10229 Alcohol dependence with intoxication, unspecified: Secondary | ICD-10-CM

## 2013-07-20 DIAGNOSIS — F1994 Other psychoactive substance use, unspecified with psychoactive substance-induced mood disorder: Secondary | ICD-10-CM

## 2013-07-20 NOTE — Progress Notes (Signed)
D: Patient denies HI and A/V hallucinations and reports on and off thoughts of SI; patient reports sleep is well; reports appetite is good ; reports energy level is normal ; reports ability to pay attention is improving; rates depression as 5/10; rates hopelessness 6/10;   A: Monitored q 15 minutes; patient encouraged to attend groups; patient educated about medications; patient given medications per physician orders; patient encouraged to express feelings and/or concerns  R: Patient is appropriate and animated; patient is cooperative and pleasant but guarded; patient's interaction with staff and peers is appropriate;  patient is taking medications as prescribed and tolerating medications; patient is attending all groups and engaging

## 2013-07-20 NOTE — Progress Notes (Signed)
Pt. attended spirituality group focusing on topic of grief and loss facilitated by chaplain. Group facilitated awareness around loss in pt's lives.  Members normalized and explored loss in relationships with self and others, explored how history and family expectations shape our expectations of we walk with grief.   As group members responded to one another, facilitator assisted group in drawing out themes in their grief work.   Dalton Molina shared with the group about his past and recent losses due to alcoholism.  Identified loss of job, Astronomer, friends, significant relationship with girlfriend, whom is also addicted to substances.   Spoke with group about feeling of loneliness and isolation.   Dalton Molina  MDiv

## 2013-07-20 NOTE — Progress Notes (Signed)
The focus of this group is to educate the patient on the purpose and policies of crisis stabilization and provide a format to answer questions about their admission.  The group details unit policies and expectations of patients while admitted.  Patient attended 0900 nurse education orientation group.  Patient actively participated, appropriate affect, alert, appropriate insight and engagement.  Patient will work on goals for discharge today.  
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
## 2013-07-20 NOTE — Progress Notes (Signed)
Made NP Mashburn aware that potassium level was 3.3 on 07/17/13; no orders were given at this time.

## 2013-07-20 NOTE — [facility] Group Notes (Signed)
[facility] LCSW Group Therapy      Feelings About Diagnosis 1:15 - 2:30 PM         07/20/2013     Type of Therapy:  Group Therapy  Participation Level:  Active  Participation Quality:  Appropriate  Affect:  Appropriate  Cognitive:  Alert and Appropriate  Insight:  Developing/Improving and Engaged  Engagement in Therapy:  Developing/Improving and Engaged  Modes of Intervention:  Discussion, Education, Exploration, Problem-Solving, Rapport Building, Support  Summary of Progress/Problems:  Patient actively participated in group. Patient discussed past and present diagnosis and the effects it has had on  life.  Patient talked about family and society being judgmental and the stigma associated with having a mental health diagnosis.  Patient shared he is not so much concerned with a diagnosis as he is with not wanting to live.  He shared he has SI but contracted for safety.  Wynn Banker 07/20/2013

## 2013-07-20 NOTE — Progress Notes (Signed)
Adult Psychoeducational Group Note  Date:  07/20/2013 Time:  11:00am  Group Topic/Focus:  Recovery Goals:   The focus of this group is to identify appropriate goals for recovery and establish a plan to achieve them.  Participation Level:  Active  Participation Quality:  Appropriate and Attentive  Affect:  Appropriate  Cognitive:  Alert and Appropriate  Insight: Appropriate  Engagement in Group:  Engaged  Modes of Intervention:  Discussion and Education  Additional Comments:  Pt attended and participated in group. When ask what does recovery mean to you pt stated changing your behavior form destructive to constructive.  Shelly Bombard D 07/20/2013, 2:05 PM

## 2013-07-20 NOTE — Progress Notes (Signed)
Adult Psychoeducational Group Note  Date:  07/20/2013 Time:  10:17 PM  Group Topic/Focus:  Healthy Communication:   The focus of this group is to discuss communication, barriers to communication, as well as healthy ways to communicate with others.  Participation Level:  Active  Participation Quality:  Appropriate, Attentive, Sharing and Supportive  Affect:  Appropriate  Cognitive:  Alert, Appropriate and Oriented  Insight: Appropriate and Good  Engagement in Group:  Engaged and Supportive  Modes of Intervention:  Problem-solving  Additional Comments:  Dalton Molina shared with the group how he was able to meet some Dalton Molina in his group.  He stated that he was able to pick up something from each person to help him along the way.   Dalton Molina 07/20/2013, 10:17 PM

## 2013-07-21 DIAGNOSIS — F1010 Alcohol abuse, uncomplicated: Secondary | ICD-10-CM

## 2013-07-21 DIAGNOSIS — F1020 Alcohol dependence, uncomplicated: Secondary | ICD-10-CM

## 2013-07-21 DIAGNOSIS — F329 Major depressive disorder, single episode, unspecified: Secondary | ICD-10-CM

## 2013-07-21 MED ORDER — LORATADINE 10 MG PO TABS
10.0000 mg | ORAL_TABLET | Freq: Once | ORAL | Status: AC
  Administered 2013-07-21: 10 mg via ORAL
  Filled 2013-07-21 (×2): qty 1

## 2013-07-21 MED ORDER — FLUOXETINE HCL 20 MG PO CAPS
20.0000 mg | ORAL_CAPSULE | Freq: Every day | ORAL | Status: DC
  Administered 2013-07-21 – 2013-07-22 (×2): 20 mg via ORAL
  Filled 2013-07-21 (×3): qty 1
  Filled 2013-07-21: qty 14
  Filled 2013-07-21 (×2): qty 1

## 2013-07-21 MED ORDER — BENZONATATE 100 MG PO CAPS
100.0000 mg | ORAL_CAPSULE | Freq: Two times a day (BID) | ORAL | Status: DC
  Administered 2013-07-21 – 2013-07-22 (×2): 100 mg via ORAL
  Filled 2013-07-21 (×8): qty 1

## 2013-07-21 NOTE — [facility] Group Notes (Signed)
Adult Psychoeducational Group Note  Date:  07/21/2013 Time:  9:22 PM  Group Topic/Focus:  Wrap-Up Group:   The focus of this group is to help patients review their daily goal of treatment and discuss progress on daily workbooks.  Participation Level:  Active  Participation Quality:  Appropriate  Affect:  Appropriate  Cognitive:  Oriented  Insight: Improving  Engagement in Group:  Improving  Modes of Intervention:  Discussion and Support  Additional Comments:  Pt stated that his goal for today was to be nice, listen and respect others which he reported he did accomplish. He rated his day an 8 out of 10 (stating yesterday was an 4).  Dwain Sarna P 07/21/2013, 9:22 PM

## 2013-07-21 NOTE — Progress Notes (Signed)
D: Patient denies HI and A/V hallucinations and reports on and off thoughts of SI; patient reports sleep is well; reports appetite is good ; reports energy level is normal; reports ability to pay attention is good; rates depression as 6/10; rates hopelessness 7/10; rates anxiety as 9/10; complaints of hives on his back and PA Mashburn  A: Monitored q 15 minutes; patient encouraged to attend groups; patient educated about medications; patient given medications per physician orders; patient encouraged to express feelings and/or concerns  R: Patient is appropriate  ; patient's interaction with staff and peers is; patient was able to set goal to talk with staff 1:1 when having feelings of SI; patient is taking medications as prescribed and tolerating medications; patient is attending all groups

## 2013-07-21 NOTE — Tx Team (Signed)
Interdisciplinary Treatment Plan Update (Adult)  Date: 07/21/2013  Time Reviewed:  9:45 AM  Progress in Treatment: Attending groups: Yes Participating in groups:  Yes Taking medication as prescribed:  Yes Tolerating medication:  Yes Family/Significant othe contact made: CSW assessing  Patient understands diagnosis:  Yes Discussing patient identified problems/goals with staff:  Yes Medical problems stabilized or resolved:  Yes Denies suicidal/homicidal ideation: Yes Issues/concerns per patient self-inventory:  Yes Other:  New problem(s) identified: N/A  Discharge Plan or Barriers: CSW assessing for appropriate referrals.  Reason for Continuation of Hospitalization: Anxiety Depression Medication Stabilization  Comments: N/A  Estimated length of stay: 3-5 days  For review of initial/current patient goals, please see plan of care.  Attendees: Patient:     Family:     Physician:  Dr. Johnalagadda 07/21/2013 10:19 AM   Nursing:   Cynthia Goble, RN 07/21/2013 10:19 AM   Clinical Social Worker:  Jayvier Burgher Horton, LCSWA 07/21/2013 10:19 AM   Other: Neil Mashburn, PA 07/21/2013 10:19 AM   Other:  Carol Davis, RN 07/21/2013 10:19 AM   Other:  Quylle Hodnett, LCSW 07/21/2013 10:19 AM   Other:  Brittany Tyson, RN 07/21/2013 10:19 AM   Other: Jennifer Clark, RN case manager 07/21/2013 10:19 AM   Other:    Other:    Other:    Other:    Other:     Scribe for Treatment Team:   Horton, Japji Kok Nicole, 07/21/2013 10:19 AM 

## 2013-07-21 NOTE — [facility] Group Notes (Signed)
[facility] LCSW Group Therapy  07/21/2013  1:15 PM   Type of Therapy:  Group Therapy  Participation Level:  Minimal  Participation Quality:Minimal   Affect:  Appropriate, Flat and Depressed  Cognitive:  Alert and Appropriate  Insight:  Developing/Improving and Engaged  Engagement in Therapy:  Developing/Improving and Engaged  Modes of Intervention:  Clarification, Confrontation, Discussion, Education, Exploration, Limit-setting, Orientation, Problem-solving, Rapport Building, Dance movement psychotherapist, Socialization and Support  Summary of Progress/Problems: The topic for group today was emotional regulation.  This group focused on both positive and negative emotion identification and allowed group members to process ways to identify feelings, regulate negative emotions, and find healthy ways to manage internal/external emotions. Group members were asked to reflect on a time when their reaction to an emotion led to a negative outcome and explored how alternative responses using emotion regulation would have benefited them. Group members were also asked to discuss a time when emotion regulation was utilized when a negative emotion was experienced.  Pt was pulled out of group by the PA and the MD so was unable to participate in group discussion.    Reyes Ivan, LCSWA 07/21/2013 2:42 PM

## 2013-07-21 NOTE — Progress Notes (Signed)
[facility] MD Progress Note  07/21/2013 2:40 PM Dalton Molina  MRN:  [id number] Subjective:  Patient was not seen on 07/20/13 due to miscommunication among team. He complaints of feeling depressed and anxious and suicidal. He has stated that he has few moment of feeling better but predominantly feeling sad. He is worried about his disposition plans. He has been attending his groups and no disturbance of sleep and appetite. He rated depression 8/10 and anxiety 8/10. He does not feel safe out side the hospital and also concern about getting relapse with his alcoholism.   Diagnosis:   DSM5: Schizophrenia Disorders:   Obsessive-Compulsive Disorders:   Trauma-Stressor Disorders:   Substance/Addictive Disorders:  Alcohol Related Disorder - Severe (303.90) Depressive Disorders:  Major Depressive Disorder - Unspecified (296.20)  Axis I: Depressive Disorder NOS, Substance Induced Mood Disorder and Alcohol abuse vs dependence  ADL's:  Impaired  Sleep: Fair  Appetite:  Fair  Suicidal Ideation:  Endorses suicidal thoughts and contract for safety in hospital Homicidal Ideation:  none AEB (as evidenced by):  Psychiatric Specialty Exam: ROS  Blood pressure 122/79, pulse 105, temperature 98 F (36.7 C), temperature source Oral, resp. rate 25, height 5\' 9"  (1.753 m), weight 99.791 kg (220 lb), SpO2 96.00%.Body mass index is 32.47 kg/(m^2).  General Appearance: Disheveled and Guarded  Eye Contact::  Minimal  Speech:  Clear and Coherent and Slow  Volume:  Decreased  Mood:  Anxious, Depressed, Hopeless and Worthless  Affect:  Constricted, Depressed and Flat  Thought Process:  Goal Directed and Intact  Orientation:  Full (Time, Place, and Person)  Thought Content:  Rumination  Suicidal Thoughts:  Yes.  without intent/plan  Homicidal Thoughts:  No  Memory:  Immediate;   Fair  Judgement:  Fair  Insight:  Fair  Psychomotor Activity:  Psychomotor Retardation  Concentration:  Fair  Recall:  Fair   Akathisia:  NA  Handed:  Right  AIMS (if indicated):     Assets:  Communication Skills Desire for Improvement Physical Health Resilience  Sleep:  Number of Hours: 6.5   Current Medications: Current Facility-Administered Medications  Medication Dose Route Frequency Provider Last Rate Last Dose  . albuterol (PROVENTIL HFA;VENTOLIN HFA) 108 (90 BASE) MCG/ACT inhaler 2 puff  2 puff Inhalation Q4H PRN Court Joy, PA-C      . alum & mag hydroxide-simeth (MAALOX/MYLANTA) 200-200-20 MG/5ML suspension 30 mL  30 mL Oral Q4H PRN Court Joy, PA-C      . chlordiazePOXIDE (LIBRIUM) capsule 25 mg  25 mg Oral Q6H PRN Court Joy, PA-C      . chlordiazePOXIDE (LIBRIUM) capsule 25 mg  25 mg Oral BH-qamhs Court Joy, PA-C   25 mg at 07/21/13 0740   Followed by  . [START ON 07/22/2013] chlordiazePOXIDE (LIBRIUM) capsule 25 mg  25 mg Oral Daily Court Joy, PA-C      . hydrOXYzine (ATARAX/VISTARIL) tablet 25 mg  25 mg Oral Q6H PRN Court Joy, PA-C      . loperamide (IMODIUM) capsule 2-4 mg  2-4 mg Oral PRN Court Joy, PA-C      . magnesium hydroxide (MILK OF MAGNESIA) suspension 30 mL  30 mL Oral Daily PRN Court Joy, PA-C      . multivitamin with minerals tablet 1 tablet  1 tablet Oral Daily Court Joy, PA-C   1 tablet at 07/21/13 0740  . nicotine (NICODERM CQ - dosed in mg/24 hours) patch 21 mg  21 mg Transdermal  Daily Court Joy, PA-C   21 mg at 07/21/13 4098  . ondansetron (ZOFRAN-ODT) disintegrating tablet 4 mg  4 mg Oral Q6H PRN Court Joy, PA-C      . thiamine (B-1) injection 100 mg  100 mg Intramuscular Once Court Joy, PA-C      . thiamine (VITAMIN B-1) tablet 100 mg  100 mg Oral Daily Court Joy, PA-C   100 mg at 07/21/13 0740  . traZODone (DESYREL) tablet 50 mg  50 mg Oral QHS PRN,MR X 1 Court Joy, PA-C   50 mg at 07/20/13 2137    Lab Results: No results found for this or any previous visit (from the past 48 hour(s)).  Physical  Findings: AIMS: Facial and Oral Movements Muscles of Facial Expression: None, normal Lips and Perioral Area: None, normal Jaw: None, normal Tongue: None, normal,Extremity Movements Upper (arms, wrists, hands, fingers): None, normal Lower (legs, knees, ankles, toes): None, normal, Trunk Movements Neck, shoulders, hips: None, normal, Overall Severity Severity of abnormal movements (highest score from questions above): None, normal Incapacitation due to abnormal movements: None, normal Patient's awareness of abnormal movements (rate only patient's report): No Awareness, Dental Status Current problems with teeth and/or dentures?: No Does patient usually wear dentures?: No  CIWA:  CIWA-Ar Total: 0 COWS:  COWS Total Score: 0  Treatment Plan Summary: Daily contact with patient to assess and evaluate symptoms and progress in treatment Medication management  Plan: Start Fluoxetine 20 mg QD/depression Treatment Plan/Recommendations:  1. Admit for crisis management and stabilization. 2. Medication management to reduce current symptoms to base line and improve the patient's overall level of functioning. 3. Treat health problems as indicated. 4. Develop treatment plan to decrease risk of relapse upon discharge and to reduce the need for readmission. 5. Psycho-social education regarding relapse prevention and self care. 6. Health care follow up as needed for medical problems. 7. Restart home medications where appropriate. 8. Disposition plans in progress  Medical Decision Making Problem Points:  New problem, with no additional work-up planned (3), Review of last therapy session (1) and Review of psycho-social stressors (1) Data Points:  Review or order clinical lab tests (1) Review or order medicine tests (1) Review of medication regiment & side effects (2) Review of new medications or change in dosage (2)  I certify that inpatient services furnished can reasonably be expected to improve  the patient's condition.   Nehemiah Settle., MD 07/21/2013, 2:40 PM

## 2013-07-21 NOTE — Progress Notes (Signed)
NUTRITION ASSESSMENT  Pt identified as at risk on the Malnutrition Screen Tool  INTERVENTION: 1. Educated patient on the importance of nutrition and encouraged intake of food and beverages.   Goal: Pt to meet >/= 90% of their estimated nutrition needs.  Monitor:  PO intake  Assessment:  Patient admitted for etoh detox treatment and etoh related depression.  Patient denies any recent weight loss.  Reports good intake currently but poor prior to admit secondary to etoh and poor food choices.  Patient stated poor nutritional quality since losing job in November 2013.  At times would just eat a bag of doritoes or a can of ravioli for dinner.  During conversation, patient stated, "Is there a Dr. Parks Ranger in house."  "I am serious."  "I am just tired."  47 y.o. male  Height: Ht Readings from Last 1 Encounters:  07/18/13 5\' 9"  (1.753 m)    Weight: Wt Readings from Last 1 Encounters:  07/18/13 220 lb (99.791 kg)    Weight Hx: Wt Readings from Last 10 Encounters:  07/18/13 220 lb (99.791 kg)  07/16/13 220 lb (99.791 kg)    BMI:  Body mass index is 32.47 kg/(m^2). Pt meets criteria for obesity grade 1 based on current BMI.  Estimated Nutritional Needs: Kcal: 25-30 kcal/kg Protein: > 1 gram protein/kg Fluid: 1 ml/kcal  Diet Order: General Pt is also offered choice of unit snacks mid-morning and mid-afternoon.  Pt is eating as desired.   Lab results and medications reviewed.   Oran Rein, RD, LDN Clinical Inpatient Dietitian Pager:  [phone] Weekend and after hours pager:  [phone]

## 2013-07-21 NOTE — Progress Notes (Signed)
Recreation Therapy Notes  Date: 09.03.2014 Time: 3:00pm Location: [address] Dayroom   Group Topic: Communication, Team Building, Problem Solving  Goal Area(s) Addresses:  Patient will effectively work with peer towards shared goal.  Patient will identify skill used to make activity successful.  Patient will identify how skills used during activity can be used to reach post d/c goals.   Behavioral Response: Attentive, Appropriate, Engaged  Intervention: Problem Solving Activity  Activity: Life Boat. Patients were given a scenario about being on a sinking yacht. Patients were informed the yacht included 15 guest, 8 of which could be placed on the life boat, along with all group members. Individuals on guest list were of varying socioeconomic classes such as a Education officer, museum, Materials engineer, Midwife, Geophysical data processor.   Education: Customer service manager, Discharge Planning  Education Outcome: Acknowledges understanding   Clinical Observations/Feedback: Patient contributed to opening discussion, assisting peers with coming up with a group definition for personal development. Patient actively participated in group activity, voicing his opinion and debating with peers appropriately. Patient contributed to wrap up discussion identifying skills needed to guide decision making process, such as morals and skill set. Patient additionally identified communication as a skill necessary to make activities successful. Patient successfully related team work to personal development, as it was defined by group.    At approximately 3:10pm patient had coughing fit and stepped out of room. Patient left group covering his mouth, as if he had coughed up a significant amount of sputum. Patient could be heard coughing loudly as he exited group and walked down the hall to his room. Patient returned approximately 5 minutes later red faced and carrying a tissue. Patient continued to cough into tissue for remainder of group  session.   Marykay Lex Harry Bark, LRT/CTRS  Abdulhadi Stopa L 07/21/2013 4:30 PM

## 2013-07-21 NOTE — [facility] Group Notes (Signed)
[facility] LCSW Aftercare Discharge Planning Group Note   07/21/2013  8:45 AM  Participation Quality:  Alert and Appropriate   Mood/Affect:  Appropriate, Flat and Depressed  Depression Rating:  6  Anxiety Rating:  9  Thoughts of Suicide:  Pt denies SI/HI, but states that if he was drunk enough he'd drink cyanide   Will you contract for safety?   Yes  Current AVH:  Pt denies  Plan for Discharge/Comments:  Pt attended discharge planning group and actively participated in group.  CSW provided pt with today's workbook.  Pt states that he's not doing too good today.  Pt states that he lost his job in November and reports his life going downhill since then.  Pt states that he sleeps in his truck in [place].  Pt is scheduled to follow up at [facility] for further treatment.  No further needs voiced by pt at this time.    Transportation Means: Pt has access to transportation   Supports: No supports mentioned at this time  Dalton Molina, LCSWA 07/21/2013 9:51 AM

## 2013-07-22 MED ORDER — TRAZODONE HCL 50 MG PO TABS
50.0000 mg | ORAL_TABLET | Freq: Every day | ORAL | Status: DC
  Filled 2013-07-22: qty 14

## 2013-07-22 MED ORDER — FLUOXETINE HCL 20 MG PO CAPS: 20.0000 mg | ORAL_CAPSULE | Freq: Every day | ORAL | Status: DC

## 2013-07-22 MED ORDER — TRAZODONE HCL 50 MG PO TABS: 50.0000 mg | ORAL_TABLET | Freq: Every day | ORAL | Status: DC

## 2013-07-22 MED ORDER — EPINEPHRINE 0.3 MG/0.3ML IJ DEVI: 0.3000 mg | Freq: Once | INTRAMUSCULAR | Status: DC

## 2013-07-22 NOTE — [facility] Suicide Risk Assessment (Signed)
[facility] INPATIENT:  Family/Significant Other Suicide Prevention Education  Suicide Prevention Education:  Contact Attempts: Raelyn Number - friend [phone]), (name of family member/significant other) has been identified by the patient as the family member/significant other with whom the patient will be residing, and identified as the person(s) who will aid the patient in the event of a mental health crisis.  With written consent from the patient, two attempts were made to provide suicide prevention education, prior to and/or following the patient's discharge.  We were unsuccessful in providing suicide prevention education.  A suicide education pamphlet was given to the patient to share with family/significant other.  Date and time of first attempt: 07/21/13 @ 12:48 pm Date and time of second attempt: 07/22/13 @ 10:33 am  Horton, Salome Arnt 07/22/2013, 10:33 AM

## 2013-07-22 NOTE — Progress Notes (Signed)
[facility] Adult Case Management Discharge Plan :  Will you be returning to the same living situation after discharge: Yes,  pt was homeless prior to admission and will continue to be homeless.  Verbalizes that he will sleep in his truck due to being kicked out of the shelter.  CSW discussed alternate options but pt declined. At discharge, do you have transportation home?:Yes,  has own transportation, provided pt a bus pass to get back to his truck Do you have the ability to pay for your medications:Yes,  access to meds  Release of information consent forms completed and in the chart;  Patient's signature needed at discharge.  Patient to Follow up at: Follow-up Information   Follow up with [facility] On 07/29/2013. (Arrive at 8AM sharp for your screening for admission.  You must bring along at least 7 days of meds or they will not let you in.)    Contact information:   [address] [place], [place] [address] Phone: [phone]] [phone] Fax: [phone]      Patient denies SI/HI:   Yes,  denies SI/HI    Safety Planning and Suicide Prevention discussed:  Yes,  discussed with pt, unable to reach pt's friend so reviewed with pt.  See suicide prevention education note.   Carmina Miller 07/22/2013, 10:24 AM

## 2013-07-22 NOTE — [facility] Suicide Risk Assessment (Signed)
Suicide Risk Assessment  Discharge Assessment     Demographic Factors:  Male, Adolescent or young adult, Caucasian, Low socioeconomic status, Living alone and Unemployed  Mental Status Per Nursing Assessment::   On Admission:  NA (denies si/hi/av. )  Current Mental Status by Physician: NA  Loss Factors: Financial problems/change in socioeconomic status  Historical Factors: Prior suicide attempts, Family history of suicide, Family history of mental illness or substance abuse and Impulsivity  Risk Reduction Factors:   Religious beliefs about death and Positive therapeutic relationship  Continued Clinical Symptoms:  Depression:   Recent sense of peace/wellbeing Alcohol/Substance Abuse/Dependencies  Cognitive Features That Contribute To Risk:  Polarized thinking    Suicide Risk:  Minimal: No identifiable suicidal ideation.  Patients presenting with no risk factors but with morbid ruminations; may be classified as minimal risk based on the severity of the depressive symptoms  Discharge Diagnoses:   AXIS I:  Substance Induced Mood Disorder and Alcohol dependence AXIS II:  Cluster C Traits AXIS III:   Past Medical History  Diagnosis Date  . Osteoarthritis   . Alcoholism /alcohol abuse   . GERD (gastroesophageal reflux disease)   . Scoliosis    AXIS IV:  economic problems, housing problems, occupational problems, other psychosocial or environmental problems, problems related to social environment and problems with primary support group AXIS V:  51-60 moderate symptoms  Plan Of Care/Follow-up recommendations:  Activity:  As tolerated Diet:  Regular  Is patient on multiple antipsychotic therapies at discharge:  No   Has Patient had three or more failed trials of antipsychotic monotherapy by history:  No  Recommended Plan for Multiple Antipsychotic Therapies: NA  Dalton Molina,[name], MD 07/22/2013, 11:03 AM

## 2013-07-22 NOTE — Progress Notes (Signed)
Patient ID: Dalton Molina, male   DOB: [birth year], 47 y.o.   MRN: [id number] Writer reviewed pt discharge instructions with pt including medications, follow up care and crisis intervention. Pt acknowledged understanding of instructions but states that he would like to stay longer at [facility]. MD reports that pt is not approved for any additional days. Pt denies SI/HI and AVH. Pt mood and affect are appropriate to the situation. Writer returned pt belongings from locker, and the pt is released into his own care with a bus pass and directions to bus stop.

## 2013-07-22 NOTE — Progress Notes (Signed)
Patient ID: Dalton Molina, male   DOB: 01-24-1966, 47 y.o.   MRN: [id number] D: Patient stated he is doing well and believes his medication is working.  Pt mood/affect seemed depressed and sad. Pt rated depression and anxiety as 6 on a 0-10 scale.  Pt denies SI/HI/AVH and pain. Pt attended evening wrap up group and engaged in discussion. Pt denies any needs or concerns.  Cooperative with assessment. No acute distressed noted at this time.   A: Met with pt 1:1. Medications administered as prescribed. Writer encouraged pt to discuss feelings. Pt encouraged to come to staff with any questions or concerns. 15 minutes checks for safety.  R: Patient remains safe. He is complaint with medications and denies any adverse reaction. Continue current POC.

## 2013-07-22 NOTE — Progress Notes (Signed)
Patient did not attend 0900 nursing group. 

## 2013-07-22 NOTE — [facility] Group Notes (Signed)
[facility] LCSW Group Therapy  07/22/2013  1:15 PM   Type of Therapy:  Group Therapy  Participation Level:  Active  Participation Quality:  Appropriate and Attentive  Affect:  Appropriate  Cognitive:  Alert and Appropriate  Insight:  Developing/Improving and Engaged  Engagement in Therapy:  Developing/Improving and Engaged  Modes of Intervention:  Activity, Clarification, Confrontation, Discussion, Education, Exploration, Limit-setting, Orientation, Problem-solving, Rapport Building, Reality Testing, Socialization and Support  Summary of Progress/Problems: Patient was attentive and engaged with speaker from [employer].  Patient was attentive to speaker while they shared their story of dealing with mental health and overcoming it.  Patient expressed interest in their programs and services and received information on their agency.  Patient processed ways they can relate to the speaker.     Dalya Maselli Horton, LCSWA 07/22/2013 1:34 PM   

## 2013-07-23 ENCOUNTER — Encounter (HOSPITAL_COMMUNITY): Payer: Self-pay | Admitting: Emergency Medicine

## 2013-07-23 ENCOUNTER — Emergency Department (HOSPITAL_COMMUNITY)
Admission: EM | Admit: 2013-07-23 | Discharge: 2013-07-25 | Disposition: A | Discharge status: Home / Self Care | Payer: Self-pay | Attending: Emergency Medicine | Admitting: Emergency Medicine

## 2013-07-23 DIAGNOSIS — F17200 Nicotine dependence, unspecified, uncomplicated: Secondary | ICD-10-CM | POA: Insufficient documentation

## 2013-07-23 DIAGNOSIS — F329 Major depressive disorder, single episode, unspecified: Secondary | ICD-10-CM

## 2013-07-23 DIAGNOSIS — F10229 Alcohol dependence with intoxication, unspecified: Secondary | ICD-10-CM | POA: Insufficient documentation

## 2013-07-23 DIAGNOSIS — F32A Depression, unspecified: Secondary | ICD-10-CM

## 2013-07-23 DIAGNOSIS — Z8739 Personal history of other diseases of the musculoskeletal system and connective tissue: Secondary | ICD-10-CM | POA: Insufficient documentation

## 2013-07-23 DIAGNOSIS — Z8719 Personal history of other diseases of the digestive system: Secondary | ICD-10-CM | POA: Insufficient documentation

## 2013-07-23 DIAGNOSIS — F3289 Other specified depressive episodes: Secondary | ICD-10-CM | POA: Insufficient documentation

## 2013-07-23 DIAGNOSIS — R45851 Suicidal ideations: Secondary | ICD-10-CM

## 2013-07-23 DIAGNOSIS — Z9289 Personal history of other medical treatment: Secondary | ICD-10-CM

## 2013-07-23 DIAGNOSIS — F1010 Alcohol abuse, uncomplicated: Secondary | ICD-10-CM

## 2013-07-23 DIAGNOSIS — F8089 Other developmental disorders of speech and language: Secondary | ICD-10-CM | POA: Insufficient documentation

## 2013-07-23 LAB — COMPREHENSIVE METABOLIC PANEL
ALT: 108 U/L — ABNORMAL HIGH (ref 0–53)
AST: 100 U/L — ABNORMAL HIGH (ref 0–37)
Albumin: 3.5 g/dL (ref 3.5–5.2)
Alkaline Phosphatase: 59 U/L (ref 39–117)
CO2: 25 mEq/L (ref 19–32)
Chloride: 100 mEq/L (ref 96–112)
Creatinine, Ser: 1.06 mg/dL (ref 0.50–1.35)
Potassium: 3.8 mEq/L (ref 3.5–5.1)
Sodium: 136 mEq/L (ref 135–145)
Total Bilirubin: 0.5 mg/dL (ref 0.3–1.2)

## 2013-07-23 LAB — CBC
Platelets: 262 10*3/uL (ref 150–400)
RBC: 4.86 MIL/uL (ref 4.22–5.81)
RDW: 12.7 % (ref 11.5–15.5)
WBC: 11.1 10*3/uL — ABNORMAL HIGH (ref 4.0–10.5)

## 2013-07-23 LAB — RAPID URINE DRUG SCREEN, HOSP PERFORMED
Amphetamines: NOT DETECTED
Benzodiazepines: POSITIVE — AB
Tetrahydrocannabinol: NOT DETECTED

## 2013-07-23 LAB — ACETAMINOPHEN LEVEL: Acetaminophen (Tylenol), Serum: <15 ug/mL (ref 10–30)

## 2013-07-23 LAB — SALICYLATE LEVEL: Salicylate Lvl: <2 mg/dL — ABNORMAL LOW (ref 2.8–20.0)

## 2013-07-23 MED ORDER — VITAMIN B-1 100 MG PO TABS
100.0000 mg | ORAL_TABLET | Freq: Every day | ORAL | Status: DC
  Administered 2013-07-23 – 2013-07-25 (×3): 100 mg via ORAL
  Filled 2013-07-23 (×3): qty 1

## 2013-07-23 MED ORDER — LORAZEPAM 1 MG PO TABS: 0.0000 mg | ORAL_TABLET | Freq: Two times a day (BID) | ORAL | Status: DC

## 2013-07-23 MED ORDER — ZOLPIDEM TARTRATE 5 MG PO TABS: 5.0000 mg | ORAL_TABLET | Freq: Every evening | ORAL | Status: DC | PRN

## 2013-07-23 MED ORDER — ONDANSETRON HCL 4 MG PO TABS: 4.0000 mg | ORAL_TABLET | Freq: Three times a day (TID) | ORAL | Status: DC | PRN

## 2013-07-23 MED ORDER — ALUM & MAG HYDROXIDE-SIMETH 200-200-20 MG/5ML PO SUSP: 30.0000 mL | ORAL | Status: DC | PRN

## 2013-07-23 MED ORDER — THIAMINE HCL 100 MG/ML IJ SOLN: 100.0000 mg | Freq: Every day | INTRAMUSCULAR | Status: DC

## 2013-07-23 MED ORDER — LORAZEPAM 1 MG PO TABS: 0.0000 mg | ORAL_TABLET | Freq: Four times a day (QID) | ORAL | Status: AC

## 2013-07-23 MED ORDER — NICOTINE 21 MG/24HR TD PT24
21.0000 mg | MEDICATED_PATCH | Freq: Every day | TRANSDERMAL | Status: DC
  Administered 2013-07-23 – 2013-07-25 (×3): 21 mg via TRANSDERMAL
  Filled 2013-07-23 (×3): qty 1

## 2013-07-23 NOTE — ED Notes (Signed)
Pt states that he has been drinking since he was 14 and now drinks anything he can get his hands on liquor and beer. Pt calm and cooperative but does state that he wants to hurt himself. No exact plan pts that he is intoxicated and we have to excuse him.

## 2013-07-23 NOTE — ED Notes (Signed)
Patient resting in bed, watching t.v. No s/s of distress noted. Respirations regular and unlabored.

## 2013-07-23 NOTE — ED Notes (Addendum)
Two bags of patient belongings placed in TCU locker number 27.   Bag contents: Jeans, camo t-shirt, boots, belt, discharge paperwork from prior visits.

## 2013-07-23 NOTE — ED Provider Notes (Signed)
CSN: [id number]     Arrival date & time 07/23/13  1133 History   First MD Initiated Contact with Patient 07/23/13 1149     Chief Complaint  Patient presents with  . Medical Clearance   (Consider location/radiation/quality/duration/timing/severity/associated sxs/prior Treatment) HPI  47 year old male with history of alcoholism and here requesting for alcohol detox. Patient has a long-standing history of alcohol abuse. States he felt depressed due to his mental issue and having thoughts of hanging himself. Admits to drinking, last alcoholic use was at 3 AM this morning. Patient call police to bring him to ER for further management of his condition. Currently has no specific complaint. Denies any suicidal ideations or hallucination. Denies any recreational drug use. No other complaints. Denies any prior history of suicide attempt.  Past Medical History  Diagnosis Date  . Osteoarthritis   . Alcoholism /alcohol abuse   . GERD (gastroesophageal reflux disease)   . Scoliosis    Past Surgical History  Procedure Laterality Date  . Appendectomy     Family History  Problem Relation Age of Onset  . Cancer Mother     bladder  . Cancer Father     mesothelioma   History  Substance Use Topics  . Smoking status: Current Every Day Smoker -- 2.00 packs/day for 20 years    Types: Cigarettes  . Smokeless tobacco: Not on file  . Alcohol Use: 15.0 oz/week    30 drink(s) per week     Comment: reports is an alcoholic    Review of Systems  Constitutional: Negative for fever.  HENT: Negative for neck pain.   Respiratory: Negative for shortness of breath.   Cardiovascular: Negative for chest pain.  Gastrointestinal: Negative for abdominal pain.  Neurological: Negative for headaches.  All other systems reviewed and are negative.    Allergies  Percocet  Home Medications   Current Outpatient Rx  Name  Route  Sig  Dispense  Refill  . cetirizine (ZYRTEC) 10 MG tablet   Oral   Take 10 mg by  mouth daily as needed for allergies.          Marland Kitchen FLUoxetine (PROZAC) 20 MG capsule   Oral   Take 1 capsule (20 mg total) by mouth daily. For depression.   30 capsule   0   . traZODone (DESYREL) 50 MG tablet   Oral   Take 1 tablet (50 mg total) by mouth at bedtime. For insomnia.   30 tablet   0    BP 119/73  Pulse 99  Temp(Src) 98.3 F (36.8 C) (Oral)  Resp 18  SpO2 97% Physical Exam  Nursing note and vitals reviewed. Constitutional: He is oriented to person, place, and time. He appears well-developed and well-nourished. No distress.  Patient is sleepy but arousable and answered questions appropriately   HENT:  Head: Normocephalic and atraumatic.  Eyes: Conjunctivae and EOM are normal. Pupils are equal, round, and reactive to light.  Neck: Neck supple.  Cardiovascular: Normal rate and regular rhythm.   Pulmonary/Chest: Effort normal and breath sounds normal.  Abdominal: Soft. There is no tenderness.  Neurological: He is alert and oriented to person, place, and time.  Skin: Skin is warm. No rash noted.  Psychiatric: His affect is blunt. His speech is delayed. He is slowed. Thought content is not paranoid. He expresses suicidal ideation. He expresses no homicidal ideation.    ED Course  Procedures (including critical care time)  1:06 PM Patient here requesting for alcohol detox. Also endorsed  suicidal ideation however denies active plan. He is able to answer all questions appropriately, however appears to be drowsy and sleepy. Urine drug screen was remarkable for benzo. Alcohol level is 106. Plan to move patient to Psych hold for further management by ACT.    Labs Review Labs Reviewed  CBC - Abnormal; Notable for the following:    WBC 11.1 (*)    Hemoglobin 17.4 (*)    MCH 35.8 (*)    MCHC 36.5 (*)    All other components within normal limits  COMPREHENSIVE METABOLIC PANEL - Abnormal; Notable for the following:    Glucose, Bld 121 (*)    AST 100 (*)    ALT 108 (*)     GFR calc non Af Amer 82 (*)    All other components within normal limits  ETHANOL - Abnormal; Notable for the following:    Alcohol, Ethyl (B) 106 (*)    All other components within normal limits  SALICYLATE LEVEL - Abnormal; Notable for the following:    Salicylate Lvl <2.0 (*)    All other components within normal limits  URINE RAPID DRUG SCREEN (HOSP PERFORMED) - Abnormal; Notable for the following:    Benzodiazepines POSITIVE (*)    All other components within normal limits  ACETAMINOPHEN LEVEL   Imaging Review No results found.  MDM   1. Alcohol abuse   2. Suicidal ideation    BP 119/73  Pulse 99  Temp(Src) 98.3 F (36.8 C) (Oral)  Resp 18  SpO2 97%    Fayrene Helper, PA-C 07/23/13 1308

## 2013-07-24 DIAGNOSIS — F1010 Alcohol abuse, uncomplicated: Secondary | ICD-10-CM

## 2013-07-24 MED ORDER — TRAZODONE HCL 50 MG PO TABS
50.0000 mg | ORAL_TABLET | Freq: Every day | ORAL | Status: DC
  Administered 2013-07-24: 50 mg via ORAL
  Filled 2013-07-24: qty 1

## 2013-07-24 MED ORDER — LORATADINE 10 MG PO TABS
10.0000 mg | ORAL_TABLET | Freq: Every day | ORAL | Status: DC
  Administered 2013-07-24 – 2013-07-25 (×2): 10 mg via ORAL
  Filled 2013-07-24 (×2): qty 1

## 2013-07-24 MED ORDER — FLUOXETINE HCL 20 MG PO CAPS
20.0000 mg | ORAL_CAPSULE | Freq: Every day | ORAL | Status: DC
  Administered 2013-07-24 – 2013-07-25 (×2): 20 mg via ORAL
  Filled 2013-07-24 (×2): qty 1

## 2013-07-24 NOTE — ED Notes (Signed)
Pt pleasant and cooperative with care; no s/s of distress noted at this time.  

## 2013-07-24 NOTE — Consult Note (Signed)
[facility] Face-to-Face Psychiatry Consult   Reason for Consult:  Alcohol dependence Referring Physician:  Emergency room physician Dalton Molina is an 47 y.o. male.  Assessment: AXIS I:  ETOH Dependence AXIS II:  Deferred AXIS III:   Past Medical History  Diagnosis Date  . Osteoarthritis   . Alcoholism /alcohol abuse   . GERD (gastroesophageal reflux disease)   . Scoliosis    AXIS IV:  other psychosocial or environmental problems AXIS V:  61-70 mild symptoms  Plan:  No evidence of imminent risk to self or others at present.   Patient does not meet criteria for psychiatric inpatient admission. Supportive therapy provided about ongoing stressors. Discussed crisis plan, support from social network, calling 911, coming to the Emergency Department, and calling Suicide Hotline.  Subjective:   Dalton Molina is a 47 y.o. male patient admitted with relapse into alcohol.Marland Kitchen  HPI:  Patient is a 47 year old Caucasian single unemployed man who was discharged from behavioral center on September 4 relapse into drinking and requesting alcohol detox.  Patient has a long-standing history of alcohol abuse.  He admitted having suicidal thoughts yesterday but denies any suicidal thoughts or plan today.  His alcohol level was 106.  Patient told him upon discharge from behavioral center he went to sleep into his truck but after some time he starts thinking about his past and relapsed into drinking.  Patient is currently homeless.  Due to his [place] offender [place] stay in [facility].  [facility] is scheduled to start program [facility] this coming Thursday.  Patient denies any tremors or shakes.  Patient denied any suicidal thoughts or any homicidal thoughts.  Patient denies any previous history of suicidal attempt or any psychosis.  His discharge medication is Prozac however he has not taken it since yesterday.  He admitted poor sleep but this morning he is feeling much better.  The patient told that he has lost his job  in November 2013 and since then he's been staying in a motel sometime but his girlfriend and recently sleeping in his truck.  Patient has no family member.  He is no longer living with his girlfriend because he believed she's not a good companion to him.  Patient admitted previous history of substance abuse treatment and for psych hospital in 2009 and recently at behavioral [facility].  Patient admitted he has some family member in [place] and off, but patient has no contact for many years.  Patient has no children. HPI Elements:   Location:  Emergency room. Quality:  Fair. Severity:  Fair. Duration:  2 days. Context:  No place to live..  Past Psychiatric History: Past Medical History  Diagnosis Date  . Osteoarthritis   . Alcoholism /alcohol abuse   . GERD (gastroesophageal reflux disease)   . Scoliosis     reports that he has been smoking Cigarettes.  He has a 40 pack-year smoking history. He does not have any smokeless tobacco history on file. He reports that he drinks about 15.0 ounces of alcohol per week. He reports that he does not use illicit drugs. Family History  Problem Relation Age of Onset  . Cancer Mother     bladder  . Cancer Father     mesothelioma           Allergies:   Allergies  Allergen Reactions  . Percocet [Oxycodone-Acetaminophen] Anaphylaxis and Rash    ACT Assessment Complete:  No:   Past Psychiatric History: Diagnosis:  Alcohol abuse  Hospitalizations:  Yes   Outpatient Care:  Currently none   Substance Abuse Care:  Yes   Self-Mutilation:  Denies   Suicidal Attempts:  Denies   Homicidal Behaviors:  Denies    Violent Behaviors:  Denies    Place of Residence:   homeless Marital Status:  Single Employed/Unemployed:  Unemployed Education:  High school graduation Family Supports:  Limited Objective: Blood pressure 108/67, pulse 74, temperature 97.7 F (36.5 C), temperature source Oral, resp. rate 20, SpO2 95.00%.There is no weight  on file to calculate BMI. Results for orders placed during the hospital encounter of 07/23/13 (from the past 72 hour(s))  ACETAMINOPHEN LEVEL     Status: None   Collection Time    07/23/13 12:05 PM      Result Value Range   Acetaminophen (Tylenol), Serum <15.0  10 - 30 ug/mL   Comment:            THERAPEUTIC CONCENTRATIONS VARY     SIGNIFICANTLY. A RANGE OF 10-30     ug/mL MAY BE AN EFFECTIVE     CONCENTRATION FOR MANY PATIENTS.     HOWEVER, SOME ARE BEST TREATED     AT CONCENTRATIONS OUTSIDE THIS     RANGE.     ACETAMINOPHEN CONCENTRATIONS     >150 ug/mL AT 4 HOURS AFTER     INGESTION AND >50 ug/mL AT 12     HOURS AFTER INGESTION ARE     OFTEN ASSOCIATED WITH TOXIC     REACTIONS.  CBC     Status: Abnormal   Collection Time    07/23/13 12:05 PM      Result Value Range   WBC 11.1 (*) 4.0 - 10.5 K/uL   RBC 4.86  4.22 - 5.81 MIL/uL   Hemoglobin 17.4 (*) 13.0 - 17.0 g/dL   HCT 16.1  09.6 - 04.5 %   MCV 98.1  78.0 - 100.0 fL   MCH 35.8 (*) 26.0 - 34.0 pg   MCHC 36.5 (*) 30.0 - 36.0 g/dL   RDW 40.9  81.1 - 91.4 %   Platelets 262  150 - 400 K/uL  COMPREHENSIVE METABOLIC PANEL     Status: Abnormal   Collection Time    07/23/13 12:05 PM      Result Value Range   Sodium 136  135 - 145 mEq/L   Potassium 3.8  3.5 - 5.1 mEq/L   Chloride 100  96 - 112 mEq/L   CO2 25  19 - 32 mEq/L   Glucose, Bld 121 (*) 70 - 99 mg/dL   BUN 10  6 - 23 mg/dL   Creatinine, Ser 7.82  0.50 - 1.35 mg/dL   Calcium 9.4  8.4 - 95.6 mg/dL   Total Protein 7.0  6.0 - 8.3 g/dL   Albumin 3.5  3.5 - 5.2 g/dL   AST 213 (*) 0 - 37 U/L   ALT 108 (*) 0 - 53 U/L   Alkaline Phosphatase 59  39 - 117 U/L   Total Bilirubin 0.5  0.3 - 1.2 mg/dL   GFR calc non Af Amer 82 (*) >90 mL/min   GFR calc Af Amer >90  >90 mL/min   Comment: (NOTE)     The eGFR has been calculated using the CKD EPI equation.     This calculation has not been validated in all clinical situations.     eGFR's persistently <90 mL/min signify  possible Chronic Kidney     Disease.  ETHANOL  Status: Abnormal   Collection Time    07/23/13 12:05 PM      Result Value Range   Alcohol, Ethyl (B) 106 (*) 0 - 11 mg/dL   Comment:            LOWEST DETECTABLE LIMIT FOR     SERUM ALCOHOL IS 11 mg/dL     FOR MEDICAL PURPOSES ONLY  SALICYLATE LEVEL     Status: Abnormal   Collection Time    07/23/13 12:05 PM      Result Value Range   Salicylate Lvl <2.0 (*) 2.8 - 20.0 mg/dL  URINE RAPID DRUG SCREEN (HOSP PERFORMED)     Status: Abnormal   Collection Time    07/23/13 12:09 PM      Result Value Range   Opiates NONE DETECTED  NONE DETECTED   Cocaine NONE DETECTED  NONE DETECTED   Benzodiazepines POSITIVE (*) NONE DETECTED   Amphetamines NONE DETECTED  NONE DETECTED   Tetrahydrocannabinol NONE DETECTED  NONE DETECTED   Barbiturates NONE DETECTED  NONE DETECTED   Comment:            DRUG SCREEN FOR MEDICAL PURPOSES     ONLY.  IF CONFIRMATION IS NEEDED     FOR ANY PURPOSE, NOTIFY LAB     WITHIN 5 DAYS.                LOWEST DETECTABLE LIMITS     FOR URINE DRUG SCREEN     Drug Class       Cutoff (ng/mL)     Amphetamine      1000     Barbiturate      200     Benzodiazepine   200     Tricyclics       300     Opiates          300     Cocaine          300     THC              50   Labs are reviewed and are pertinent for alcohol level 106.  Current Facility-Administered Medications  Medication Dose Route Frequency Provider Last Rate Last Dose  . alum & mag hydroxide-simeth (MAALOX/MYLANTA) 200-200-20 MG/5ML suspension 30 mL  30 mL Oral PRN Fayrene Helper, PA-C      . LORazepam (ATIVAN) tablet 0-4 mg  0-4 mg Oral Q6H Fayrene Helper, PA-C       Followed by  . [START ON 07/25/2013] LORazepam (ATIVAN) tablet 0-4 mg  0-4 mg Oral Q12H Fayrene Helper, PA-C      . nicotine (NICODERM CQ - dosed in mg/24 hours) patch 21 mg  21 mg Transdermal Daily Fayrene Helper, PA-C   21 mg at 07/24/13 1008  . ondansetron (ZOFRAN) tablet 4 mg  4 mg Oral Q8H PRN Fayrene Helper, PA-C      . thiamine (VITAMIN B-1) tablet 100 mg  100 mg Oral Daily Fayrene Helper, PA-C   100 mg at 07/24/13 1008   Or  . thiamine (B-1) injection 100 mg  100 mg Intravenous Daily Fayrene Helper, PA-C      . zolpidem (AMBIEN) tablet 5 mg  5 mg Oral QHS PRN Fayrene Helper, PA-C       Current Outpatient Prescriptions  Medication Sig Dispense Refill  . cetirizine (ZYRTEC) 10 MG tablet Take 10 mg by mouth daily as needed for allergies.       Marland Kitchen  FLUoxetine (PROZAC) 20 MG capsule Take 1 capsule (20 mg total) by mouth daily. For depression.  30 capsule  0  . traZODone (DESYREL) 50 MG tablet Take 1 tablet (50 mg total) by mouth at bedtime. For insomnia.  30 tablet  0    Psychiatric Specialty Exam:     Blood pressure 108/67, pulse 74, temperature 97.7 F (36.5 C), temperature source Oral, resp. rate 20, SpO2 95.00%.There is no weight on file to calculate BMI.  General Appearance: Casual  Eye Contact::  Fair  Speech:  Clear and Coherent  Volume:  Normal  Mood:  Anxious  Affect:  Constricted  Thought Process:  Intact  Orientation:  Negative  Thought Content:  Negative  Suicidal Thoughts:  No  Homicidal Thoughts:  No  Memory:  Negative  Judgement:  Good  Insight:  Good  Psychomotor Activity:  Normal  Concentration:  Fair  Recall:  Fair  Akathisia:  No  Handed:  Right  AIMS (if indicated):     Assets:  Desire for Improvement Physical Health  Sleep:      Treatment Plan Summary: At this time patient does not have any tremors or shakes.  Patient does not have any active suicidal thoughts or plan.  Recommended outpatient treatment.  [facility] has appointment with [facility] on coming Thursday.  Recommend social worker involvement to help his living situation and placement.  Patient does not need any inpatient services at that time.  Dalton Molina T. 07/24/2013 11:54 AM

## 2013-07-24 NOTE — BH Assessment (Signed)
[facility] Assessment Progress Note  Update:  Per Dr. Lolly Mustache, pt to be discharged with SA referrals and homeless referrals.  These were faxed to [facility] psych ED to staff to give to pt before he is discharged.

## 2013-07-25 ENCOUNTER — Emergency Department (HOSPITAL_COMMUNITY)
Admission: EM | Admit: 2013-07-25 | Discharge: 2013-07-26 | Disposition: A | Discharge status: Home / Self Care | Payer: Self-pay | Attending: Emergency Medicine | Admitting: Emergency Medicine

## 2013-07-25 ENCOUNTER — Encounter (HOSPITAL_COMMUNITY): Payer: Self-pay | Admitting: *Deleted

## 2013-07-25 DIAGNOSIS — Z8719 Personal history of other diseases of the digestive system: Secondary | ICD-10-CM | POA: Insufficient documentation

## 2013-07-25 DIAGNOSIS — Z79899 Other long term (current) drug therapy: Secondary | ICD-10-CM | POA: Insufficient documentation

## 2013-07-25 DIAGNOSIS — R45851 Suicidal ideations: Secondary | ICD-10-CM

## 2013-07-25 DIAGNOSIS — F1020 Alcohol dependence, uncomplicated: Secondary | ICD-10-CM | POA: Insufficient documentation

## 2013-07-25 DIAGNOSIS — F17200 Nicotine dependence, unspecified, uncomplicated: Secondary | ICD-10-CM | POA: Insufficient documentation

## 2013-07-25 DIAGNOSIS — Z8739 Personal history of other diseases of the musculoskeletal system and connective tissue: Secondary | ICD-10-CM | POA: Insufficient documentation

## 2013-07-25 DIAGNOSIS — F1010 Alcohol abuse, uncomplicated: Secondary | ICD-10-CM

## 2013-07-25 LAB — COMPREHENSIVE METABOLIC PANEL
ALT: 96 U/L — ABNORMAL HIGH (ref 0–53)
Albumin: 3.7 g/dL (ref 3.5–5.2)
Alkaline Phosphatase: 65 U/L (ref 39–117)
Calcium: 9.4 mg/dL (ref 8.4–10.5)
GFR calc Af Amer: >90 mL/min (ref >90)
Potassium: 3.7 mEq/L (ref 3.5–5.1)
Sodium: 134 mEq/L — ABNORMAL LOW (ref 135–145)
Total Protein: 7.3 g/dL (ref 6.0–8.3)

## 2013-07-25 LAB — CBC
MCH: 35.3 pg — ABNORMAL HIGH (ref 26.0–34.0)
MCHC: 36.3 g/dL — ABNORMAL HIGH (ref 30.0–36.0)
Platelets: 292 10*3/uL (ref 150–400)
RDW: 12.6 % (ref 11.5–15.5)

## 2013-07-25 LAB — RAPID URINE DRUG SCREEN, HOSP PERFORMED
Barbiturates: NOT DETECTED
Benzodiazepines: POSITIVE — AB
Cocaine: NOT DETECTED
Opiates: NOT DETECTED
Tetrahydrocannabinol: NOT DETECTED

## 2013-07-25 LAB — ACETAMINOPHEN LEVEL: Acetaminophen (Tylenol), Serum: <15 ug/mL (ref 10–30)

## 2013-07-25 NOTE — ED Notes (Signed)
Pt transferred from triage, presents for medical clearance, pt intoxicated, drank 12 beers today.  Feeling hopeless. Denies HI or AV hallucinations. Pt reports diag. With Bipolar in past.  Attempted SI less than 30 days ago.  Pt calm & cooperative.

## 2013-07-25 NOTE — ED Notes (Signed)
Pt has been wanded, valuables at desk

## 2013-07-25 NOTE — ED Notes (Signed)
D/C instructions and resource information given, understanding stated. Ambulatory without difficulty. No complaints voiced. Bus pass provided. Escorted by security to front of hospital.

## 2013-07-25 NOTE — ED Notes (Signed)
States he is drunk and suicidal, no specific plans other than maybe use a gun or rope, thinks it would be a good idea to kill self tonight

## 2013-07-25 NOTE — Consult Note (Signed)
Patient resting quietly in his room watching television.  He was discharged from [facility] on Wednesday for alcohol detox and drank 2-40 oz beers.  Then, he came to the ED on Friday with a "little diarrhea".  He does not have a place to live and cannot go to a shelter because of charges.  He has no signs and symptoms of withdrawal, denies suicidal/homicidal ideations and hallucinations.  Social worker will attempt to find him housing options but patient is to be discharged today.  I agreed with the findings, treatment and disposition plan of this patient. Kathryne Sharper, MD

## 2013-07-25 NOTE — ED Provider Notes (Signed)
CSN: [id number]     Arrival date & time 07/25/13  2215 History   First MD Initiated Contact with Patient 07/25/13 2256     Chief Complaint  Patient presents with  . Medical Clearance  . Suicidal   (Consider location/radiation/quality/duration/timing/severity/associated sxs/prior Treatment) Patient is a 47 y.o. male presenting with mental health disorder. The history is provided by the patient.  Mental Health Problem Presenting symptoms: suicidal thoughts   Patient accompanied by:  Law enforcement Degree of incapacity (severity):  Moderate Onset quality:  Gradual Timing:  Constant Progression:  Worsening Chronicity:  Chronic Context: alcohol use   Treatment compliance:  Some of the time Relieved by:  Nothing Worsened by:  Nothing tried Ineffective treatments:  None tried Associated symptoms: no abdominal pain, no chest pain and no headaches     Past Medical History  Diagnosis Date  . Osteoarthritis   . Alcoholism /alcohol abuse   . GERD (gastroesophageal reflux disease)   . Scoliosis    Past Surgical History  Procedure Laterality Date  . Appendectomy     Family History  Problem Relation Age of Onset  . Cancer Mother     bladder  . Cancer Father     mesothelioma   History  Substance Use Topics  . Smoking status: Current Every Day Smoker -- 2.00 packs/day for 20 years    Types: Cigarettes  . Smokeless tobacco: Not on file  . Alcohol Use: 15.0 oz/week    30 drink(s) per week     Comment: reports is an alcoholic    Review of Systems  Constitutional: Negative for fever.  HENT: Negative for rhinorrhea, drooling and neck pain.   Eyes: Negative for pain.  Respiratory: Negative for cough and shortness of breath.   Cardiovascular: Negative for chest pain and leg swelling.  Gastrointestinal: Negative for nausea, vomiting, abdominal pain and diarrhea.  Genitourinary: Negative for dysuria and hematuria.  Musculoskeletal: Negative for gait problem.  Skin: Negative for  color change.  Neurological: Negative for numbness and headaches.  Hematological: Negative for adenopathy.  Psychiatric/Behavioral: Positive for suicidal ideas. Negative for behavioral problems.  All other systems reviewed and are negative.    Allergies  Percocet  Home Medications   Current Outpatient Rx  Name  Route  Sig  Dispense  Refill  . cetirizine (ZYRTEC) 10 MG tablet   Oral   Take 10 mg by mouth daily as needed for allergies.          Marland Kitchen FLUoxetine (PROZAC) 20 MG capsule   Oral   Take 1 capsule (20 mg total) by mouth daily. For depression.   30 capsule   0   . traZODone (DESYREL) 50 MG tablet   Oral   Take 1 tablet (50 mg total) by mouth at bedtime. For insomnia.   30 tablet   0    BP 109/77  Pulse 93  Temp(Src) 98.5 F (36.9 C) (Oral)  Resp 16  Ht 5\' 9"  (1.753 m)  Wt 220 lb (99.791 kg)  BMI 32.47 kg/m2  SpO2 98% Physical Exam  Nursing note and vitals reviewed. Constitutional: He is oriented to person, place, and time. He appears well-developed and well-nourished.  HENT:  Head: Normocephalic and atraumatic.  Right Ear: External ear normal.  Left Ear: External ear normal.  Nose: Nose normal.  Mouth/Throat: Oropharynx is clear and moist. No oropharyngeal exudate.  Eyes: Conjunctivae and EOM are normal. Pupils are equal, round, and reactive to light.  Neck: Normal range of motion. Neck  supple.  Cardiovascular: Normal rate, regular rhythm, normal heart sounds and intact distal pulses.  Exam reveals no gallop and no friction rub.   No murmur heard. Pulmonary/Chest: Effort normal and breath sounds normal. No respiratory distress. He has no wheezes.  Abdominal: Soft. Bowel sounds are normal. He exhibits no distension. There is no tenderness. There is no rebound and no guarding.  Musculoskeletal: Normal range of motion. He exhibits no edema and no tenderness.  Neurological: He is alert and oriented to person, place, and time.  Skin: Skin is warm and dry.   Psychiatric: His speech is normal and behavior is normal. He expresses suicidal ideation. He expresses no suicidal plans.    ED Course  Procedures (including critical care time) Labs Review Labs Reviewed  CBC - Abnormal; Notable for the following:    WBC 11.0 (*)    Hemoglobin 17.7 (*)    MCH 35.3 (*)    MCHC 36.3 (*)    All other components within normal limits  COMPREHENSIVE METABOLIC PANEL - Abnormal; Notable for the following:    Sodium 134 (*)    AST 83 (*)    ALT 96 (*)    GFR calc non Af Amer 83 (*)    All other components within normal limits  ETHANOL - Abnormal; Notable for the following:    Alcohol, Ethyl (B) 166 (*)    All other components within normal limits  SALICYLATE LEVEL - Abnormal; Notable for the following:    Salicylate Lvl <2.0 (*)    All other components within normal limits  URINE RAPID DRUG SCREEN (HOSP PERFORMED) - Abnormal; Notable for the following:    Benzodiazepines POSITIVE (*)    All other components within normal limits  ACETAMINOPHEN LEVEL   Imaging Review No results found.  MDM   1. Suicidal thoughts   2. Alcohol abuse    11:10 PM 47 y.o. male with a history of alcohol abuse who presents with suicidal ideations. The patient states that he lost his job last November and has lost all hope. He states that he has no support system. He states he called the police this evening because he felt unsafe with himself. He has no specific plan to hurt himself but states that he has attempted to hang himself in the past. He states that he has had a 12 pack of beer today and his last drink was approximately one hour ago. The patient is afebrile here and his vital signs are unremarkable. Nursing notes that the patient was wrapping the blood pressure cuff cord around his neck while in triage. Will place him in psych holding with a sitter. Will consult psych. The patient notes that he is on Prozac and trazodone but does not take his medications regularly. Will  await psych eval before restarting these meds as he is not taking them regularly.     Junius Argyle, MD 07/26/13 [phone]

## 2013-07-25 NOTE — ED Notes (Signed)
Pt brought to ED from [facility] parking lot via EMS after calling 911 for etoh and SI. Pt reported to have consumed 12 beers after AA meeting in 3 hours, vomitus noted on clothing. Pt does admit to SI to EMS

## 2013-07-25 NOTE — ED Provider Notes (Signed)
Medical screening examination/treatment/procedure(s) were performed by non-physician practitioner and as supervising physician I was immediately available for consultation/collaboration.  Toy Baker, MD 07/25/13 2049

## 2013-07-25 NOTE — ED Notes (Signed)
Pt states he tried to wrap one of the cords in the room around his neck but was too drunk to tighten it.

## 2013-07-25 NOTE — Discharge Summary (Signed)
Physician Discharge Summary Note  Patient:  Dalton Molina is an 47 y.o., male MRN:  [id number] DOB:  02/12/1966 Patient phone:  [phone] (home)  Patient address:   [address] [place] [address],   Date of Admission:  07/23/2013 Date of Discharge: 07/25/2013  Reason for Admission:  Alcohol detox  Discharge Diagnoses: Active Problems:   * No active hospital problems. *  Review of Systems  Constitutional: Negative.   HENT: Negative.   Eyes: Negative.   Respiratory: Negative.   Cardiovascular: Negative.   Gastrointestinal: Negative.   Genitourinary: Negative.   Musculoskeletal: Negative.   Skin: Negative.   Neurological: Negative.   Endo/Heme/Allergies: Negative.   Psychiatric/Behavioral: Positive for substance abuse.    DSM5:  Substance/Addictive Disorders:  Alcohol Related Disorder - Mild (305.00)   Axis Diagnosis:   AXIS I:  Alcohol Abuse AXIS II:  Deferred AXIS III:   Past Medical History  Diagnosis Date  . Osteoarthritis   . Alcoholism /alcohol abuse   . GERD (gastroesophageal reflux disease)   . Scoliosis    AXIS IV:  economic problems, housing problems, occupational problems, other psychosocial or environmental problems, problems related to social environment and problems with primary support group AXIS V:  61-70 mild symptoms  Level of Care:  OP  Hospital Course:  Patient came to the ED on Friday, after discharging from [facility] for alcohol dependency on Wednesday.  He drank 2 - 40 oz beers Wednesday night and started having some diarrhea on Friday and came to ED.  His issue his homelessness but unable to go to a shelter because of charges.  He currently lives in his truck.  Consults:  None  Significant Diagnostic Studies:  labs: Complete in ED, reviewed, stable  Discharge Vitals:   Blood pressure 98/69, pulse 89, temperature 97.9 F (36.6 C), temperature source Oral, resp. rate 18, SpO2 95.00%. There is no weight on file to calculate  BMI. Lab Results:   Results for orders placed during the hospital encounter of 07/23/13 (from the past 72 hour(s))  ACETAMINOPHEN LEVEL     Status: None   Collection Time    07/23/13 12:05 PM      Result Value Range   Acetaminophen (Tylenol), Serum <15.0  10 - 30 ug/mL   Comment:            THERAPEUTIC CONCENTRATIONS VARY     SIGNIFICANTLY. A RANGE OF 10-30     ug/mL MAY BE AN EFFECTIVE     CONCENTRATION FOR MANY PATIENTS.     HOWEVER, SOME ARE BEST TREATED     AT CONCENTRATIONS OUTSIDE THIS     RANGE.     ACETAMINOPHEN CONCENTRATIONS     >150 ug/mL AT 4 HOURS AFTER     INGESTION AND >50 ug/mL AT 12     HOURS AFTER INGESTION ARE     OFTEN ASSOCIATED WITH TOXIC     REACTIONS.  CBC     Status: Abnormal   Collection Time    07/23/13 12:05 PM      Result Value Range   WBC 11.1 (*) 4.0 - 10.5 K/uL   RBC 4.86  4.22 - 5.81 MIL/uL   Hemoglobin 17.4 (*) 13.0 - 17.0 g/dL   HCT 57.8  46.9 - 62.9 %   MCV 98.1  78.0 - 100.0 fL   MCH 35.8 (*) 26.0 - 34.0 pg   MCHC 36.5 (*) 30.0 - 36.0 g/dL   RDW 52.8  41.3 - 24.4 %  Platelets 262  150 - 400 K/uL  COMPREHENSIVE METABOLIC PANEL     Status: Abnormal   Collection Time    07/23/13 12:05 PM      Result Value Range   Sodium 136  135 - 145 mEq/L   Potassium 3.8  3.5 - 5.1 mEq/L   Chloride 100  96 - 112 mEq/L   CO2 25  19 - 32 mEq/L   Glucose, Bld 121 (*) 70 - 99 mg/dL   BUN 10  6 - 23 mg/dL   Creatinine, Ser 9.60  0.50 - 1.35 mg/dL   Calcium 9.4  8.4 - 45.4 mg/dL   Total Protein 7.0  6.0 - 8.3 g/dL   Albumin 3.5  3.5 - 5.2 g/dL   AST 098 (*) 0 - 37 U/L   ALT 108 (*) 0 - 53 U/L   Alkaline Phosphatase 59  39 - 117 U/L   Total Bilirubin 0.5  0.3 - 1.2 mg/dL   GFR calc non Af Amer 82 (*) >90 mL/min   GFR calc Af Amer >90  >90 mL/min   Comment: (NOTE)     The eGFR has been calculated using the CKD EPI equation.     This calculation has not been validated in all clinical situations.     eGFR's persistently <90 mL/min signify possible  Chronic Kidney     Disease.  ETHANOL     Status: Abnormal   Collection Time    07/23/13 12:05 PM      Result Value Range   Alcohol, Ethyl (B) 106 (*) 0 - 11 mg/dL   Comment:            LOWEST DETECTABLE LIMIT FOR     SERUM ALCOHOL IS 11 mg/dL     FOR MEDICAL PURPOSES ONLY  SALICYLATE LEVEL     Status: Abnormal   Collection Time    07/23/13 12:05 PM      Result Value Range   Salicylate Lvl <2.0 (*) 2.8 - 20.0 mg/dL  URINE RAPID DRUG SCREEN (HOSP PERFORMED)     Status: Abnormal   Collection Time    07/23/13 12:09 PM      Result Value Range   Opiates NONE DETECTED  NONE DETECTED   Cocaine NONE DETECTED  NONE DETECTED   Benzodiazepines POSITIVE (*) NONE DETECTED   Amphetamines NONE DETECTED  NONE DETECTED   Tetrahydrocannabinol NONE DETECTED  NONE DETECTED   Barbiturates NONE DETECTED  NONE DETECTED   Comment:            DRUG SCREEN FOR MEDICAL PURPOSES     ONLY.  IF CONFIRMATION IS NEEDED     FOR ANY PURPOSE, NOTIFY LAB     WITHIN 5 DAYS.                LOWEST DETECTABLE LIMITS     FOR URINE DRUG SCREEN     Drug Class       Cutoff (ng/mL)     Amphetamine      1000     Barbiturate      200     Benzodiazepine   200     Tricyclics       300     Opiates          300     Cocaine          300     THC              50  Physical Findings: AIMS:  , ,  ,  ,    CIWA:  CIWA-Ar Total: 0 COWS:     Psychiatric Specialty Exam: See Psychiatric Specialty Exam and Suicide Risk Assessment completed by Attending Physician prior to discharge.  Discharge destination:  Home  Is patient on multiple antipsychotic therapies at discharge:  No   Has Patient had three or more failed trials of antipsychotic monotherapy by history:  No  Recommended Plan for Multiple Antipsychotic Therapies:  N/A     Medication List    ASK your doctor about these medications     Indication   cetirizine 10 MG tablet  Commonly known as:  ZYRTEC  Take 10 mg by mouth daily as needed for allergies.       FLUoxetine 20 MG capsule  Commonly known as:  PROZAC  Take 1 capsule (20 mg total) by mouth daily. For depression.   Indication:  Depression     traZODone 50 MG tablet  Commonly known as:  DESYREL  Take 1 tablet (50 mg total) by mouth at bedtime. For insomnia.   Indication:  Trouble Sleeping         Follow-up recommendations:  Activity:  As tolerated Diet:  low-sodium heart healthy diet  Comments:  Patient will continue his care at [facility].  Total Discharge Time:  Greater than 30 minutes.  SignedNanine Means, PMH-NP 07/25/2013, 11:48 AM  I agreed with the findings, treatment and disposition plan of this patient. Kathryne Sharper, MD

## 2013-07-25 NOTE — BHH Counselor (Signed)
Per Dr. Lolly Mustache pt is a SW issue.  He needs assistance with living arrangements or he will just keep coming to ED.  At this time, he is not a psych issue.

## 2013-07-26 DIAGNOSIS — F1010 Alcohol abuse, uncomplicated: Secondary | ICD-10-CM

## 2013-07-26 DIAGNOSIS — R45851 Suicidal ideations: Secondary | ICD-10-CM

## 2013-07-26 NOTE — ED Provider Notes (Signed)
Diet is sober at over 24 hours. He is able to contract for safety. Arranged for inpatient treatment for his alcoholism at [facility] starting on Thursday. He feels safe with himself until that time and has means to get himself to [facility] with assistance and help from his friends.  Claudean Kinds, MD 07/26/13 1115

## 2013-07-26 NOTE — Progress Notes (Signed)
Patient ID: Dalton Molina, male   DOB: 07/19/1966, 47 y.o.   MRN: [id number] Previous note lost by system-essentially pt just out of [facility] and consulted on and D/C from ER by DrArfeen 9/6-homeless with legal problems-returned after calling police and put BP cuff around neck intoxicated.No further consult indicated until he sobers up .Recommend further eval then by Psych ED staff and consideration of referral to [facility] etc if his SIMD clears Reather Littler is not truly suicidal

## 2013-07-26 NOTE — Progress Notes (Signed)
Patient ID: Dalton Molina, male   DOB: [birth year], 46 y.o.   MRN: [id number] Pt returned to ER under influence of etoh after calling police then placed BP cuff cord around neck in gesture after being consulted on and D/C by Dr Lolly Mustache 9/6 and D/C [facility] 9/2 for same complaints. He is homeless and has legal charges.No further consult indicated until he sobers up to assess his SIMD  during day shift.

## 2013-07-26 NOTE — ED Notes (Signed)
Telepsych consult requested.

## 2013-07-26 NOTE — ED Notes (Signed)
Lock #[id number]

## 2013-07-26 NOTE — Consult Note (Signed)
  S-Pt was D/C from ED by Dr Lolly Mustache s/p d/c Weds from [facility] for same complaints of etoh and suicidal thinking.However his main problem is homelessness due to legal charges that will not allow him into the homeless shelter.He called the police to bring him back to the ED tonite with same complaints.He is reported to have committed suicide gesture with BP cuff while in ED.  O-See Dr Sheela Stack consult and D/C  notes from 9/6.ETOH is 166.He has benzos in his UDS-? From ED administration  A-Alcoholism with acute intoxication.SIMD.Homelessness and problems with legal system-?personality disorder..Suicide gesture ?manipulation for secondary gain  P-No further consultation tonite.Await clearance of acute intoxication and reassess during day shift.Would recommend treatment at  [facility] etc if not suicidal when not under influence.

## 2013-07-26 NOTE — Discharge Summary (Signed)
Physician Discharge Summary Note  Patient:  Dalton Molina is an 47 y.o., male MRN:  [id number] DOB:  1966-10-08 Patient phone:  [phone] (home)  Patient address:   [address] [place] [address],   Date of Admission:  07/18/2013 Date of Discharge: 07/22/2013  Reason for Admission:  Alcohol intoxication with substance induced mood disorder  Discharge Diagnoses: Active Problems:   * No active hospital problems. *  ROS  DSM5:  Schizophrenia Disorders:   Obsessive-Compulsive Disorders:   Trauma-Stressor Disorders:   Substance/Addictive Disorders:  Alcohol Intoxication with Use Disorder - Severe (U44.034) Depressive Disorders:    Axis Diagnosis:   AXIS I:  Alcohol intoxication with substance induced mood disorder AXIS II:  Deferred AXIS III:   Past Medical History  Diagnosis Date  . Osteoarthritis   . Alcoholism /alcohol abuse   . GERD (gastroesophageal reflux disease)   . Scoliosis    AXIS IV:  occupational problems, problems related to legal system/crime, problems related to social environment, problems with access to health care services and problems with primary support group AXIS V:  51-60 moderate symptoms  Level of Care:  OP  Hospital Course:  The patient Dalton Molina was admitted emergently after he called 911 stating he had been drinking and wanted to die. He had plans to walk into traffic to die. He states he has been drinking and doing cocaine for several months. He sold his house for 20,000.$ and has been living off this money in hotels and motels and now the money has run out. He states he has been on the Registered Sex Offenders List since 1996. Dalton Molina stated he had spent time in prison for taking "indecent liberties with a minor."  He was felt to be in need of detox and was admitted to the [facility] for further stabilization and care.       Upon admission to the unit Dalton Molina was started on a Librium detox protocol and encouraged to participate in unit  programming. His withdrawal symptoms were monitored daily to assess his response to treatment. He was encouraged to participate groups as well.         He was started on Prozac for his depressive symptoms and reported no side effects to the medication. He was not interested in further treatment for his substance abuse issues.  He was focused on all of the problems caused by his being on a Sex Offenders Registration list and did not feel that he had any responsibility for his current situation. He was discharged in much improved condition than upon arrival, denied SI/HI and voiced no AVH. He was felt to be stable for discharge with plans to follow up as noted below. Consults:  None  Significant Diagnostic Studies:    Discharge Vitals:   Blood pressure 104/73, pulse 114, temperature 98 F (36.7 C), temperature source Oral, resp. rate 17, height 5\' 9"  (1.753 m), weight 99.791 kg (220 lb), SpO2 96.00%. Body mass index is 32.47 kg/(m^2). Lab Results:   No results found for this or any previous visit (from the past 72 hour(s)).  Physical Findings: AIMS: Facial and Oral Movements Muscles of Facial Expression: None, normal Lips and Perioral Area: None, normal Jaw: None, normal Tongue: None, normal,Extremity Movements Upper (arms, wrists, hands, fingers): None, normal Lower (legs, knees, ankles, toes): None, normal, Trunk Movements Neck, shoulders, hips: None, normal, Overall Severity Severity of abnormal movements (highest score from questions above): None, normal Incapacitation due to abnormal movements: None, normal Patient's awareness  of abnormal movements (rate only patient's report): No Awareness, Dental Status Current problems with teeth and/or dentures?: No Does patient usually wear dentures?: No  CIWA:  CIWA-Ar Total: 2 COWS:  COWS Total Score: 0  Psychiatric Specialty Exam: See Psychiatric Specialty Exam and Suicide Risk Assessment completed by Attending Physician prior to  discharge.  Discharge destination:  Home  Is patient on multiple antipsychotic therapies at discharge:  No   Has Patient had three or more failed trials of antipsychotic monotherapy by history:  No  Recommended Plan for Multiple Antipsychotic Therapies: NA  Discharge Orders   Future Orders Complete By Expires   Diet - low sodium heart healthy  As directed    Discharge instructions  As directed    Comments:     Take all of your medications as directed. Be sure to keep all of your follow up appointments.  If you are unable to keep your follow up appointment, call your Doctor's office to let them know, and reschedule.  Make sure that you have enough medication to last until your appointment. Be sure to get plenty of rest. Going to bed at the same time each night will help. Try to avoid sleeping during the day.  Increase your activity as tolerated. Regular exercise will help you to sleep better and improve your mental health. Eating a heart healthy diet is recommended. Try to avoid salty or fried foods. Be sure to avoid all alcohol and illegal drugs.   Increase activity slowly  As directed        Medication List    STOP taking these medications       EPINEPHrine 0.3 mg/0.3 mL Devi  Commonly known as:  EPI-PEN      TAKE these medications     Indication   cetirizine 10 MG tablet  Commonly known as:  ZYRTEC  Take 10 mg by mouth daily as needed for allergies.    seasonal allergies   FLUoxetine 20 MG capsule  Commonly known as:  PROZAC  Take 1 capsule (20 mg total) by mouth daily. For depression.   Indication:  Depression     traZODone 50 MG tablet  Commonly known as:  DESYREL  Take 1 tablet (50 mg total) by mouth at bedtime. For insomnia.   Indication:  Trouble Sleeping       Follow-up Information   Follow up with [facility] On 07/29/2013. (Arrive at 8AM sharp for your screening for admission.  You must bring along at least 7 days of meds or they will not let you in.)     Contact information:   [address] [place], [place] [address] Phone: [phone] Fax: [phone]      Follow-up recommendations:   Activities: Resume activity as tolerated. Diet: Heart healthy low sodium diet Tests: Follow up testing will be determined by your out patient provider. Comments:    Total Discharge Time:  Greater than 30 minutes.  Signed: Rona Ravens. Mashburn RPAC 1:13 PM 07/26/2013  Patient is seen personally, completed suicidal risk assessment, case discussed with treatment team, and developed discharge plan. Reviewed the information documented and agree with the treatment plan.  Sanjiv Castorena,[name] 07/26/2013 1:45 PM

## 2013-07-26 NOTE — Progress Notes (Signed)
Per discussion with psychiatrist, patient is psychiatrically stable for discharge. Pt gave providers and CSW permission to call pt friend Jennefer Bravo to see if he could stay with him until he goes to [facility] for a treatment bed on Thursday.  CSW unable to reach pt friend and unable to leave a message. Pt stated that he is ok going back to his truck and waiting for his bed on Thursday at [facility]. CSW confirmed with Floydene Flock is scheduled to be admitted to  atreatment bed on Thursday. CSW and pt discussed the importance of staying sober until Thursday. Pt shared he has supports from AA and the [facility]. CSW also discussed with patient mobile crisis to assist. Pt provided with bus pass and resource information. Per discussion with psychiatrist, pt is stable for discharge. Pt denies SI/HI/Ah/VH.   Marland KitchenCatha Gosselin, [place] [phone]  ED CSW .07/26/2013 9:47am

## 2013-07-26 NOTE — Consult Note (Signed)
[facility] Face-to-Face Psychiatry Consult   Reason for Consult:  Alcohol Dependence Referring Physician:  EDP Dalton Molina is an 47 y.o. male.  Assessment: AXIS I:  Alcohol Abuse AXIS II:  Deferred AXIS III:   Past Medical History  Diagnosis Date  . Osteoarthritis   . Alcoholism /alcohol abuse   . GERD (gastroesophageal reflux disease)   . Scoliosis    AXIS IV:  housing problems, occupational problems, other psychosocial or environmental problems, problems related to legal system/crime, problems related to social environment and problems with primary support group AXIS V:  61-70 mild symptoms  Plan:  No evidence of imminent risk to self or others at present.    Subjective:   Dalton Molina is a 47 y.o. male patient admitted with Alcohol intoxication.  HPI:  Patient was discharged on Saturday after alcohol detox and came right back after getting intoxicated with alcohol at a nearby [place] where he stays in his Truck.  Patient was seen this morning sober and was discharged .  He states he will be going to [facility] rehabilitation and recovery unit.  Patient was detoxed in our inpatient unit last week and was discharged.  He came back in less than 48 hours.  He plans to stay with a friend until Thursday and he plans to move in to [facility].  He is discharged home and before leaving the hospital, patient denied SI/HI/AVH and he did not have any tremors or shakes.  HPI Elements:   Location:  [facility]. Quality:  MODERATE. Severity:  MODERATE.  Past Psychiatric History: Past Medical History  Diagnosis Date  . Osteoarthritis   . Alcoholism /alcohol abuse   . GERD (gastroesophageal reflux disease)   . Scoliosis     reports that he has been smoking Cigarettes.  He has a 40 pack-year smoking history. He does not have any smokeless tobacco history on file. He reports that he drinks about 15.0 ounces of alcohol per week. He reports that he does not use illicit drugs. Family History  Problem Relation  Age of Onset  . Cancer Mother     bladder  . Cancer Father     mesothelioma           Allergies:   Allergies  Allergen Reactions  . Percocet [Oxycodone-Acetaminophen] Anaphylaxis and Rash    ACT Assessment Complete:  Yes:    Educational Status    Risk to Self: Risk to self Is patient at risk for suicide?: Yes Substance abuse history and/or treatment for substance abuse?: Yes  Risk to Others:    Abuse:    Prior Inpatient Therapy:    Prior Outpatient Therapy:    Additional Information:                    Objective: Blood pressure 119/78, pulse 91, temperature 97.9 F (36.6 C), temperature source Oral, resp. rate 16, height 5\' 9"  (1.753 m), weight 99.791 kg (220 lb), SpO2 95.00%.Body mass index is 32.47 kg/(m^2). Results for orders placed during the hospital encounter of 07/25/13 (from the past 72 hour(s))  ACETAMINOPHEN LEVEL     Status: None   Collection Time    07/25/13 11:01 PM      Result Value Range   Acetaminophen (Tylenol), Serum <15.0  10 - 30 ug/mL   Comment:            THERAPEUTIC CONCENTRATIONS VARY     SIGNIFICANTLY. A RANGE OF 10-30     ug/mL MAY BE AN EFFECTIVE  CONCENTRATION FOR MANY PATIENTS.     HOWEVER, SOME ARE BEST TREATED     AT CONCENTRATIONS OUTSIDE THIS     RANGE.     ACETAMINOPHEN CONCENTRATIONS     >150 ug/mL AT 4 HOURS AFTER     INGESTION AND >50 ug/mL AT 12     HOURS AFTER INGESTION ARE     OFTEN ASSOCIATED WITH TOXIC     REACTIONS.  CBC     Status: Abnormal   Collection Time    07/25/13 11:01 PM      Result Value Range   WBC 11.0 (*) 4.0 - 10.5 K/uL   RBC 5.01  4.22 - 5.81 MIL/uL   Hemoglobin 17.7 (*) 13.0 - 17.0 g/dL   HCT 16.1  09.6 - 04.5 %   MCV 97.2  78.0 - 100.0 fL   MCH 35.3 (*) 26.0 - 34.0 pg   MCHC 36.3 (*) 30.0 - 36.0 g/dL   RDW 40.9  81.1 - 91.4 %   Platelets 292  150 - 400 K/uL  COMPREHENSIVE METABOLIC PANEL     Status: Abnormal   Collection Time    07/25/13 11:01 PM      Result Value Range    Sodium 134 (*) 135 - 145 mEq/L   Potassium 3.7  3.5 - 5.1 mEq/L   Chloride 97  96 - 112 mEq/L   CO2 23  19 - 32 mEq/L   Glucose, Bld 99  70 - 99 mg/dL   BUN 11  6 - 23 mg/dL   Creatinine, Ser 7.82  0.50 - 1.35 mg/dL   Calcium 9.4  8.4 - 95.6 mg/dL   Total Protein 7.3  6.0 - 8.3 g/dL   Albumin 3.7  3.5 - 5.2 g/dL   AST 83 (*) 0 - 37 U/L   ALT 96 (*) 0 - 53 U/L   Alkaline Phosphatase 65  39 - 117 U/L   Total Bilirubin 0.4  0.3 - 1.2 mg/dL   GFR calc non Af Amer 83 (*) >90 mL/min   GFR calc Af Amer >90  >90 mL/min   Comment: (NOTE)     The eGFR has been calculated using the CKD EPI equation.     This calculation has not been validated in all clinical situations.     eGFR's persistently <90 mL/min signify possible Chronic Kidney     Disease.  ETHANOL     Status: Abnormal   Collection Time    07/25/13 11:01 PM      Result Value Range   Alcohol, Ethyl (B) 166 (*) 0 - 11 mg/dL   Comment:            LOWEST DETECTABLE LIMIT FOR     SERUM ALCOHOL IS 11 mg/dL     FOR MEDICAL PURPOSES ONLY  SALICYLATE LEVEL     Status: Abnormal   Collection Time    07/25/13 11:01 PM      Result Value Range   Salicylate Lvl <2.0 (*) 2.8 - 20.0 mg/dL  URINE RAPID DRUG SCREEN (HOSP PERFORMED)     Status: Abnormal   Collection Time    07/25/13 11:04 PM      Result Value Range   Opiates NONE DETECTED  NONE DETECTED   Cocaine NONE DETECTED  NONE DETECTED   Benzodiazepines POSITIVE (*) NONE DETECTED   Amphetamines NONE DETECTED  NONE DETECTED   Tetrahydrocannabinol NONE DETECTED  NONE DETECTED   Barbiturates NONE DETECTED  NONE DETECTED   Comment:  DRUG SCREEN FOR MEDICAL PURPOSES     ONLY.  IF CONFIRMATION IS NEEDED     FOR ANY PURPOSE, NOTIFY LAB     WITHIN 5 DAYS.                LOWEST DETECTABLE LIMITS     FOR URINE DRUG SCREEN     Drug Class       Cutoff (ng/mL)     Amphetamine      1000     Barbiturate      200     Benzodiazepine   200     Tricyclics       300     Opiates           300     Cocaine          300     THC              50   Labs are reviewed and are pertinent for Positive Benzodiazepine and alcohol level 166, elevated LFT  No current facility-administered medications for this encounter.   Current Outpatient Prescriptions  Medication Sig Dispense Refill  . cetirizine (ZYRTEC) 10 MG tablet Take 10 mg by mouth daily as needed for allergies.       Marland Kitchen FLUoxetine (PROZAC) 20 MG capsule Take 1 capsule (20 mg total) by mouth daily. For depression.  30 capsule  0  . traZODone (DESYREL) 50 MG tablet Take 1 tablet (50 mg total) by mouth at bedtime. For insomnia.  30 tablet  0    Psychiatric Specialty Exam:     Blood pressure 119/78, pulse 91, temperature 97.9 F (36.6 C), temperature source Oral, resp. rate 16, height 5\' 9"  (1.753 m), weight 99.791 kg (220 lb), SpO2 95.00%.Body mass index is 32.47 kg/(m^2).  General Appearance: Casual and Disheveled  Eye Contact::  Good  Speech:  Clear and Coherent and Normal Rate  Volume:  Normal  Mood:  Anxious  Affect:  Congruent  Thought Process:  Coherent  Orientation:  Full (Time, Place, and Person)  Thought Content:  NA  Suicidal Thoughts:  No  Homicidal Thoughts:  No  Memory:  Immediate;   Good Recent;   Good Remote;   Good  Judgement:  Poor  Insight:  Lacking  Psychomotor Activity:  Normal  Concentration:  Fair  Recall:  Fair  Akathisia:  NA  Handed:  Right  AIMS (if indicated):     Assets:  Desire for Improvement  Sleep:      Treatment Plan Summary: Consult and face to face interview with Dr Lolly Mustache We recommended d/c home to follow with [facility] recovery for inpatient rehabilitation. Daily contact with patient to assess and evaluate symptoms and progress in treatment  Dahlia Byes, C    PMHNP-BC  07/26/2013 2:23 PM  I have personally seen the patient and agreed with the findings and involved in the treatment plan. Kathryne Sharper, MD

## 2013-07-27 NOTE — Progress Notes (Signed)
Agree with plan 

## 2013-07-27 NOTE — Progress Notes (Signed)
Patient Discharge Instructions:  After Visit Summary (AVS):   Faxed to:  07/27/13 Discharge Summary Note:   Faxed to:  07/27/13 Psychiatric Admission Assessment Note:   Faxed to:  07/27/13 Suicide Risk Assessment - Discharge Assessment:   Faxed to:  07/27/13 Faxed/Sent to the Next Level Care provider:  07/27/13 Faxed to [facility] @ [phone]  Jerelene Redden, 07/27/2013, 4:06 PM

## 2013-07-27 NOTE — Consult Note (Signed)
Agree with plan as patient is intoxicated

## 2013-07-28 ENCOUNTER — Encounter (HOSPITAL_COMMUNITY): Payer: Self-pay | Admitting: Emergency Medicine

## 2013-07-28 ENCOUNTER — Emergency Department (HOSPITAL_COMMUNITY)
Admission: EM | Admit: 2013-07-28 | Discharge: 2013-07-29 | Disposition: A | Discharge status: Home / Self Care | Payer: Self-pay | Attending: Emergency Medicine | Admitting: Emergency Medicine

## 2013-07-28 DIAGNOSIS — Z8719 Personal history of other diseases of the digestive system: Secondary | ICD-10-CM | POA: Insufficient documentation

## 2013-07-28 DIAGNOSIS — F17200 Nicotine dependence, unspecified, uncomplicated: Secondary | ICD-10-CM | POA: Insufficient documentation

## 2013-07-28 DIAGNOSIS — F329 Major depressive disorder, single episode, unspecified: Secondary | ICD-10-CM

## 2013-07-28 DIAGNOSIS — F10229 Alcohol dependence with intoxication, unspecified: Secondary | ICD-10-CM | POA: Insufficient documentation

## 2013-07-28 DIAGNOSIS — F39 Unspecified mood [affective] disorder: Secondary | ICD-10-CM | POA: Insufficient documentation

## 2013-07-28 DIAGNOSIS — F1010 Alcohol abuse, uncomplicated: Secondary | ICD-10-CM

## 2013-07-28 DIAGNOSIS — R45851 Suicidal ideations: Secondary | ICD-10-CM

## 2013-07-28 DIAGNOSIS — F10929 Alcohol use, unspecified with intoxication, unspecified: Secondary | ICD-10-CM

## 2013-07-28 DIAGNOSIS — Z8739 Personal history of other diseases of the musculoskeletal system and connective tissue: Secondary | ICD-10-CM | POA: Insufficient documentation

## 2013-07-28 DIAGNOSIS — F3289 Other specified depressive episodes: Secondary | ICD-10-CM | POA: Insufficient documentation

## 2013-07-28 DIAGNOSIS — Z79899 Other long term (current) drug therapy: Secondary | ICD-10-CM | POA: Insufficient documentation

## 2013-07-28 LAB — CBC
MCH: 35.8 pg — ABNORMAL HIGH (ref 26.0–34.0)
MCHC: 37.1 g/dL — ABNORMAL HIGH (ref 30.0–36.0)
MCV: 96.4 fL (ref 78.0–100.0)
Platelets: 304 10*3/uL (ref 150–400)
RBC: 5 MIL/uL (ref 4.22–5.81)
RDW: 12.5 % (ref 11.5–15.5)

## 2013-07-28 LAB — RAPID URINE DRUG SCREEN, HOSP PERFORMED
Amphetamines: NOT DETECTED
Barbiturates: NOT DETECTED
Benzodiazepines: POSITIVE — AB
Cocaine: NOT DETECTED
Opiates: NOT DETECTED
Tetrahydrocannabinol: NOT DETECTED

## 2013-07-28 LAB — COMPREHENSIVE METABOLIC PANEL
AST: 88 U/L — ABNORMAL HIGH (ref 0–37)
CO2: 22 mEq/L (ref 19–32)
Calcium: 9.5 mg/dL (ref 8.4–10.5)
Creatinine, Ser: 0.98 mg/dL (ref 0.50–1.35)
GFR calc non Af Amer: >90 mL/min (ref >90)
Sodium: 131 mEq/L — ABNORMAL LOW (ref 135–145)
Total Protein: 7.3 g/dL (ref 6.0–8.3)

## 2013-07-28 MED ORDER — ALUM & MAG HYDROXIDE-SIMETH 200-200-20 MG/5ML PO SUSP: 30.0000 mL | ORAL | Status: DC | PRN

## 2013-07-28 MED ORDER — THIAMINE HCL 100 MG/ML IJ SOLN: 100.0000 mg | Freq: Every day | INTRAMUSCULAR | Status: DC

## 2013-07-28 MED ORDER — IBUPROFEN 200 MG PO TABS: 600.0000 mg | ORAL_TABLET | Freq: Three times a day (TID) | ORAL | Status: DC | PRN

## 2013-07-28 MED ORDER — LORAZEPAM 1 MG PO TABS
0.0000 mg | ORAL_TABLET | Freq: Four times a day (QID) | ORAL | Status: DC
  Administered 2013-07-28: 1 mg via ORAL
  Filled 2013-07-28: qty 1

## 2013-07-28 MED ORDER — VITAMIN B-1 100 MG PO TABS
100.0000 mg | ORAL_TABLET | Freq: Every day | ORAL | Status: DC
  Administered 2013-07-28: 09:00:00 100 mg via ORAL
  Filled 2013-07-28: qty 1

## 2013-07-28 MED ORDER — LORAZEPAM 1 MG PO TABS: 0.0000 mg | ORAL_TABLET | Freq: Two times a day (BID) | ORAL | Status: DC

## 2013-07-28 MED ORDER — ZOLPIDEM TARTRATE 5 MG PO TABS: 5.0000 mg | ORAL_TABLET | Freq: Every evening | ORAL | Status: DC | PRN

## 2013-07-28 MED ORDER — ONDANSETRON HCL 4 MG PO TABS: 4.0000 mg | ORAL_TABLET | Freq: Three times a day (TID) | ORAL | Status: DC | PRN

## 2013-07-28 MED ORDER — NICOTINE 21 MG/24HR TD PT24
21.0000 mg | MEDICATED_PATCH | Freq: Every day | TRANSDERMAL | Status: DC
  Administered 2013-07-28: 21 mg via TRANSDERMAL
  Filled 2013-07-28: qty 1

## 2013-07-28 MED ORDER — DIPHENHYDRAMINE HCL 25 MG PO CAPS
50.0000 mg | ORAL_CAPSULE | Freq: Once | ORAL | Status: AC
  Administered 2013-07-28: 50 mg via ORAL
  Filled 2013-07-28: qty 2

## 2013-07-28 MED ORDER — HYDROCORTISONE 1 % EX CREA
TOPICAL_CREAM | Freq: Three times a day (TID) | CUTANEOUS | Status: DC | PRN
  Administered 2013-07-28: 22:00:00 via TOPICAL
  Filled 2013-07-28: qty 28

## 2013-07-28 NOTE — ED Notes (Signed)
Patient reported to nurse Lauren that he can tylenol.

## 2013-07-28 NOTE — BH Assessment (Signed)
Assessment Note  Dalton Molina is an 47 y.o. male who presents to the ED with SI and alcohol intoxication. This Clinical research associate is familiar with patient from ED admission on Monday. Pt reports SI when he was drinking last night and had a plan to hang himself with a belt. Pt able to contract for safety at this time and denies SI/HI/AH/VH. Pt reports that he feels more depressed when alone and when he started drinking it worsened his feelings of depression.   Pt reports he has a bed for treatment at [facility] on Thursday at 8am. Pt currently lives in his truck and obtains money through panhandling. Pt reports he has poor appetite but tries to eat a little something. Pt reports on Monday when he discharged he had more hope, and went to 2 AA meetings. Pt reports on Tuesday night after AA he felt alone, and started drinking again. Pt reprots drink 12 beers last night. In the past, pt has reported drinking 12 or more beers daily for months. Pt does not meet criteria for detox at this time. Pt shared he is a registered sex offender and is unelligible to stay in shelter. Pt lost his job this past year.   CSW will discuss further with psychiatric team to determine pt disposition.     Axis I: alcohol abuse, substance induced mood disorder, depressive disorder nos  Axis II: Deferred Axis III:  Past Medical History  Diagnosis Date  . Osteoarthritis   . Alcoholism /alcohol abuse   . GERD (gastroesophageal reflux disease)   . Scoliosis    Axis IV: economic problems, housing problems, occupational problems, other psychosocial or environmental problems, problems related to legal system/crime, problems related to social environment, problems with access to health care services and problems with primary support group Axis V: 41-50 serious symptoms  Past Medical History:  Past Medical History  Diagnosis Date  . Osteoarthritis   . Alcoholism /alcohol abuse   . GERD (gastroesophageal reflux disease)   . Scoliosis      Past Surgical History  Procedure Laterality Date  . Appendectomy      Family History:  Family History  Problem Relation Age of Onset  . Cancer Mother     bladder  . Cancer Father     mesothelioma    Social History:  reports that he has been smoking Cigarettes.  He has a 40 pack-year smoking history. He does not have any smokeless tobacco history on file. He reports that he drinks about 15.0 ounces of alcohol per week. He reports that he does not use illicit drugs.  Additional Social History:  Alcohol / Drug Use History of alcohol / drug use?: Yes Substance #1 Name of Substance 1: Alcohol  1 - Age of First Use: teens 1 - Amount (size/oz): 12 beers  1 - Frequency: daily  1 - Duration: months 1 - Last Use / Amount: yesterday 12 beers   CIWA: CIWA-Ar BP: 135/75 mmHg Pulse Rate: 100 Nausea and Vomiting: no nausea and no vomiting Tactile Disturbances: none Tremor: no tremor Auditory Disturbances: not present Paroxysmal Sweats: no sweat visible Visual Disturbances: not present Anxiety: no anxiety, at ease Headache, Fullness in Head: none present Agitation: normal activity Orientation and Clouding of Sensorium: oriented and can do serial additions CIWA-Ar Total: 0 COWS:    Allergies:  Allergies  Allergen Reactions  . Percocet [Oxycodone-Acetaminophen] Anaphylaxis and Rash    Home Medications:  (Not in a hospital admission)  OB/GYN Status:  No LMP for male  patient.  General Assessment Data Location of Assessment: [facility] Is this a Tele or Face-to-Face Assessment?: Face-to-Face Is this an Initial Assessment or a Re-assessment for this encounter?: Initial Assessment Living Arrangements: Alone Can pt return to current living arrangement?: Yes Admission Status: Voluntary Is patient capable of signing voluntary admission?: No Transfer from: Home Referral Source: Self/Family/Friend     [facility] Crisis Care Plan Living Arrangements: Alone  Education Status Is  patient currently in school?: No  Risk to self Suicidal Ideation: No-Not Currently/Within Last 6 Months (SI with drinking, last night thoughs of hanging self with be) Suicidal Intent: No-Not Currently/Within Last 6 Months Is patient at risk for suicide?: No Suicidal Plan?: No-Not Currently/Within Last 6 Months Access to Means: Yes Specify Access to Suicidal Means: belt, alcohol  What has been your use of drugs/alcohol within the last 12 months?: alcohol Previous Attempts/Gestures: Yes How many times?: 3 Other Self Harm Risks: no Triggers for Past Attempts: Family contact;Unpredictable;Unknown Intentional Self Injurious Behavior: None Family Suicide History: Yes Recent stressful life event(s): Conflict (Comment);Divorce;Loss (Comment);Job Loss;Financial Problems;Legal Issues Persecutory voices/beliefs?: No Depression: Yes Depression Symptoms: Despondent Substance abuse history and/or treatment for substance abuse?: Yes  Risk to Others Homicidal Ideation: No Thoughts of Harm to Others: No Current Homicidal Intent: No Current Homicidal Plan: No Access to Homicidal Means: No Identified Victim: n/a History of harm to others?: No Assessment of Violence: None Noted Violent Behavior Description: none Does patient have access to weapons?: No Criminal Charges Pending?: No Does patient have a court date: No  Psychosis Hallucinations: None noted Delusions: None noted  Mental Status Report Appear/Hygiene: Other (Comment) (calm and casual ) Eye Contact: Good Motor Activity: Freedom of movement Speech: Logical/coherent Level of Consciousness: Alert Mood: Depressed Affect: Appropriate to circumstance Anxiety Level: Minimal Thought Processes: Coherent;Relevant Judgement: Unimpaired Orientation: Person;Place;Time;Situation Obsessive Compulsive Thoughts/Behaviors: None  Cognitive Functioning Concentration: Normal Memory: Recent Intact;Remote Intact IQ: Average Insight:  Fair Impulse Control: Poor Appetite: Poor Sleep: No Change Vegetative Symptoms: None  ADLScreening [facility] Assessment Services) Patient's cognitive ability adequate to safely complete daily activities?: Yes Patient able to express need for assistance with ADLs?: Yes Independently performs ADLs?: Yes (appropriate for developmental age)  Prior Inpatient Therapy Prior Inpatient Therapy: Yes Prior Therapy Dates: 2014  Prior Therapy Facilty/Provider(s): [facility] Reason for Treatment: detox   Prior Outpatient Therapy Prior Outpatient Therapy: No  ADL Screening (condition at time of admission) Patient's cognitive ability adequate to safely complete daily activities?: Yes Patient able to express need for assistance with ADLs?: Yes Independently performs ADLs?: Yes (appropriate for developmental age)         Values / Beliefs Cultural Requests During Hospitalization: None Spiritual Requests During Hospitalization: None        Additional Information 1:1 In Past 12 Months?: No CIRT Risk: No Elopement Risk: No Does patient have medical clearance?: Yes     Disposition:  Disposition Initial Assessment Completed for this Encounter: Yes Disposition of Patient: Outpatient treatment Type of outpatient treatment: Chemical Dependence - Intensive Outpatient  On Site Evaluation by:   Reviewed with Physician:    Catha Gosselin A 07/28/2013 9:35 AM

## 2013-07-28 NOTE — Consult Note (Signed)
  Evaluation for inpatient treatment; Face to face interview and consulted with Dr. Lolly Mustache  Referred by:  EDP   HPI:  Patient presents to [facility] with complaints of suicidal ideation and alcohol intoxication.  Patient states that he has been doing well and going to his meeting.  States after a meeting that had gotten out early "I drove my truck to a parking lot and I was just sitting there thinking about sleeping  In my truck.  It was still daylight out side and I was like what the heck and went and brought me some liquor. I do good when I am around people; it just when I am alone that I mess up."  Patient denies suicidal ideation, homicidal ideation, psychosis, and paranoia.  Patient states that he is to start residential program at [facility] 07/29/2013.     @ Axis I: Alcohol Abuse, Depressive Disorder NOS and Suicidal Thoughts Axis II: Deferred Axis III:  Past Medical History  Diagnosis Date  . Osteoarthritis   . Alcoholism /alcohol abuse   . GERD (gastroesophageal reflux disease)   . Scoliosis    Axis IV: economic problems, housing problems, other psychosocial or environmental problems, problems related to social environment and problems with primary support group Axis V: 51-60 moderate symptoms  @Psychiatric  Specialty Exam: @PHYSEXAMBYAGE2 @  @ROS @  Blood pressure 106/72, pulse 81, temperature 99.6 F (37.6 C), temperature source Oral, resp. rate 18, SpO2 95.00%.There is no weight on file to calculate BMI.  General Appearance: Casual and Disheveled  Eye Contact::  Good  Speech:  Clear and Coherent and Normal Rate  Volume:  Normal  Mood:  Depressed  Affect:  Depressed  Thought Process:  Circumstantial, Coherent and Goal Directed  Orientation:  Full (Time, Place, and Person)  Thought Content:  Rumination  Suicidal Thoughts:  No  Homicidal Thoughts:  No  Memory:  Immediate;   Good Recent;   Good Remote;   Good  Judgement:  Fair  Insight:  Fair and Present  Psychomotor Activity:   Normal  Concentration:  Fair  Recall:  Fair  Akathisia:  No  Handed:  Right  AIMS (if indicated):     Assets:  Communication Skills Desire for Improvement Transportation  Sleep:      Plan:    Disposition:  Continue to treat for detox.  Monitor patient overnight.  May discharge in the AM to follow up with [facility] B. Rankin FNP-BC Family Nurse Practitioner, Board Certified  I have personally seen the patient and agreed with the findings and involved in the treatment plan. Kathryne Sharper, MD

## 2013-07-28 NOTE — ED Notes (Signed)
Pt currently asleep; no s/s of distress noted. Respirations regular and unlabored. 

## 2013-07-28 NOTE — ED Notes (Signed)
Pt ambulated to BR with stand by assist only.

## 2013-07-28 NOTE — ED Provider Notes (Signed)
Medical screening examination/treatment/procedure(s) were performed by non-physician practitioner and as supervising physician I was immediately available for consultation/collaboration.  Lorina Duffner M Jenille Laszlo, MD 07/28/13 0855 

## 2013-07-28 NOTE — ED Notes (Signed)
Per pt report: pt reports trying to hang himself this evening but the tree branch broke.  Pt walked in with the police.  Pt endorses using ETOH this evening.  Pt reports losing job in November and has been feeling depressed.  Pt reports feeling hopeless.  Pt calm and cooperative.  Pt smells of ETOH.

## 2013-07-28 NOTE — ED Provider Notes (Signed)
CSN: [id number]     Arrival date & time 07/28/13  0001 History   First MD Initiated Contact with Patient 07/28/13 0037     Chief Complaint  Patient presents with  . Suicidal  . Alcohol Intoxication   HPI  History provided by the patient. Patient is a 47 year old male with alcohol addiction presenting intoxicated and reporting increased depression with suicidal ideations. Patient was recently seen in the emergency room for similar symptoms. He states repeatedly that he wishes he had a "cyanide pill". He states that his life is worthless and he does not want to live anymore. Does report heavy alcohol use today. He does report that he has plans to try to [facility] and is hoping that he can last for a long.  He denies any other complaints. No recent illnesses. No other aggravating or alleviating factors. No other associated symptoms.     Past Medical History  Diagnosis Date  . Osteoarthritis   . Alcoholism /alcohol abuse   . GERD (gastroesophageal reflux disease)   . Scoliosis    Past Surgical History  Procedure Laterality Date  . Appendectomy     Family History  Problem Relation Age of Onset  . Cancer Mother     bladder  . Cancer Father     mesothelioma   History  Substance Use Topics  . Smoking status: Current Every Day Smoker -- 2.00 packs/day for 20 years    Types: Cigarettes  . Smokeless tobacco: Not on file  . Alcohol Use: 15.0 oz/week    30 drink(s) per week     Comment: reports is an alcoholic    Review of Systems  Psychiatric/Behavioral: Positive for suicidal ideas and dysphoric mood.  All other systems reviewed and are negative.    Allergies  Percocet  Home Medications   Current Outpatient Rx  Name  Route  Sig  Dispense  Refill  . cetirizine (ZYRTEC) 10 MG tablet   Oral   Take 10 mg by mouth daily as needed for allergies.          Marland Kitchen FLUoxetine (PROZAC) 20 MG capsule   Oral   Take 1 capsule (20 mg  total) by mouth daily. For depression.   30 capsule   0   . traZODone (DESYREL) 50 MG tablet   Oral   Take 1 tablet (50 mg total) by mouth at bedtime. For insomnia.   30 tablet   0    BP 108/72  Pulse 90  Temp(Src) 97.8 F (36.6 C) (Oral)  Resp 16  SpO2 97% Physical Exam  Nursing note and vitals reviewed. Constitutional: He is oriented to person, place, and time. He appears well-developed and well-nourished. No distress.  HENT:  Head: Normocephalic and atraumatic.  Eyes: Conjunctivae are normal.  Cardiovascular: Normal rate and regular rhythm.   Pulmonary/Chest: Effort normal and breath sounds normal. No respiratory distress.  Abdominal: Soft.  Neurological: He is alert and oriented to person, place, and time.  Skin: Skin is warm.  Psychiatric: He has a normal mood and affect. His behavior is normal.    ED Course  Procedures   Results for orders placed during the hospital encounter of 07/28/13  URINE RAPID DRUG SCREEN (HOSP PERFORMED)      Result Value Range   Opiates NONE DETECTED  NONE DETECTED   Cocaine NONE DETECTED  NONE DETECTED   Benzodiazepines POSITIVE (*) NONE DETECTED   Amphetamines NONE DETECTED  NONE DETECTED   Tetrahydrocannabinol NONE DETECTED  NONE DETECTED   Barbiturates NONE DETECTED  NONE DETECTED  ETHANOL      Result Value Range   Alcohol, Ethyl (B) 138 (*) 0 - 11 mg/dL  CBC      Result Value Range   WBC 12.3 (*) 4.0 - 10.5 K/uL   RBC 5.00  4.22 - 5.81 MIL/uL   Hemoglobin 17.9 (*) 13.0 - 17.0 g/dL   HCT 69.6  29.5 - 28.4 %   MCV 96.4  78.0 - 100.0 fL   MCH 35.8 (*) 26.0 - 34.0 pg   MCHC 37.1 (*) 30.0 - 36.0 g/dL   RDW 13.2  44.0 - 10.2 %   Platelets 304  150 - 400 K/uL  COMPREHENSIVE METABOLIC PANEL      Result Value Range   Sodium 131 (*) 135 - 145 mEq/L   Potassium 3.6  3.5 - 5.1 mEq/L   Chloride 96  96 - 112 mEq/L   CO2 22  19 - 32 mEq/L   Glucose, Bld 83  70 - 99 mg/dL   BUN 8  6 - 23 mg/dL   Creatinine, Ser 7.25  0.50 - 1.35  mg/dL   Calcium 9.5  8.4 - 36.6 mg/dL   Total Protein 7.3  6.0 - 8.3 g/dL   Albumin 3.8  3.5 - 5.2 g/dL   AST 88 (*) 0 - 37 U/L   ALT 91 (*) 0 - 53 U/L   Alkaline Phosphatase 67  39 - 117 U/L   Total Bilirubin 0.5  0.3 - 1.2 mg/dL   GFR calc non Af Amer >90  >90 mL/min   GFR calc Af Amer >90  >90 mL/min        MDM   1. Alcohol intoxication   2. Alcohol abuse   3. Suicidal thoughts       Patient seen and evaluated. The patient admits to heavy alcohol use and appears intoxicated. He does report being homeless and having increased depression with SI.  Psych holding orders in place. Patient will be moved to the Psych ed.    Angus Seller, PA-C 07/28/13 [phone]

## 2013-07-29 NOTE — ED Notes (Addendum)
Written dc instructions reviewed w/ pt, pt verbalized understanding.  Pt is to go directly to [facility] for treatment.  Cab voucher given.  Pt ambulatory to dc window w/ mHt, belongings returned after leaving the unit. Sandwich and soda given prior to leaving the unit.

## 2013-07-29 NOTE — ED Notes (Signed)
Dr Daughtry into see.  Pt denies si/hi/avh at this time, nad, is aware that he will be going to [facility] by cab for treatment.

## 2013-07-29 NOTE — ED Provider Notes (Signed)
Pt seend by psych team/social work.  He states he has no SI/HI, is agreeable to going to [facility] this morning to begin residential treatment program. Return precautions given for new or worsening symptoms including SI/HI.  1. Alcohol intoxication   2. Alcohol abuse   3. Suicidal thoughts      Shanna Cisco, MD 07/29/13 2021

## 2013-07-29 NOTE — ED Notes (Signed)
Up to the bathroom 

## 2013-07-29 NOTE — Progress Notes (Addendum)
Pt to be admitted to [facility] today. CSW provided patient with cab voucher. Pt denies SI/HI/AH/VH. Marland KitchenNo further Clinical Social Work needs, signing off.   Catha Gosselin, LCSW [phone]  ED CSW .07/29/2013 7:53am

## 2013-07-29 NOTE — ED Notes (Signed)
ACT into see 

## 2013-07-30 ENCOUNTER — Emergency Department (HOSPITAL_COMMUNITY)
Admission: EM | Admit: 2013-07-30 | Discharge: 2013-07-31 | Disposition: A | Discharge status: Home / Self Care | Payer: Self-pay | Attending: Emergency Medicine | Admitting: Emergency Medicine

## 2013-07-30 ENCOUNTER — Encounter (HOSPITAL_COMMUNITY): Payer: Self-pay | Admitting: Family Medicine

## 2013-07-30 DIAGNOSIS — F17200 Nicotine dependence, unspecified, uncomplicated: Secondary | ICD-10-CM | POA: Insufficient documentation

## 2013-07-30 DIAGNOSIS — F32A Depression, unspecified: Secondary | ICD-10-CM

## 2013-07-30 DIAGNOSIS — F1020 Alcohol dependence, uncomplicated: Secondary | ICD-10-CM | POA: Insufficient documentation

## 2013-07-30 DIAGNOSIS — Z79899 Other long term (current) drug therapy: Secondary | ICD-10-CM | POA: Insufficient documentation

## 2013-07-30 DIAGNOSIS — Z8739 Personal history of other diseases of the musculoskeletal system and connective tissue: Secondary | ICD-10-CM | POA: Insufficient documentation

## 2013-07-30 DIAGNOSIS — F1010 Alcohol abuse, uncomplicated: Secondary | ICD-10-CM

## 2013-07-30 DIAGNOSIS — F3289 Other specified depressive episodes: Secondary | ICD-10-CM | POA: Insufficient documentation

## 2013-07-30 DIAGNOSIS — F1310 Sedative, hypnotic or anxiolytic abuse, uncomplicated: Secondary | ICD-10-CM | POA: Insufficient documentation

## 2013-07-30 DIAGNOSIS — Z8719 Personal history of other diseases of the digestive system: Secondary | ICD-10-CM | POA: Insufficient documentation

## 2013-07-30 DIAGNOSIS — R45851 Suicidal ideations: Secondary | ICD-10-CM | POA: Insufficient documentation

## 2013-07-30 DIAGNOSIS — F329 Major depressive disorder, single episode, unspecified: Secondary | ICD-10-CM | POA: Insufficient documentation

## 2013-07-30 DIAGNOSIS — F10929 Alcohol use, unspecified with intoxication, unspecified: Secondary | ICD-10-CM

## 2013-07-30 LAB — CBC
HCT: 47.7 % (ref 39.0–52.0)
Hemoglobin: 17.4 g/dL — ABNORMAL HIGH (ref 13.0–17.0)
RBC: 4.93 MIL/uL (ref 4.22–5.81)
RDW: 12.5 % (ref 11.5–15.5)
WBC: 11.8 10*3/uL — ABNORMAL HIGH (ref 4.0–10.5)

## 2013-07-30 LAB — COMPREHENSIVE METABOLIC PANEL
Albumin: 3.7 g/dL (ref 3.5–5.2)
Alkaline Phosphatase: 64 U/L (ref 39–117)
BUN: 9 mg/dL (ref 6–23)
CO2: 24 mEq/L (ref 19–32)
Chloride: 99 mEq/L (ref 96–112)
Glucose, Bld: 102 mg/dL — ABNORMAL HIGH (ref 70–99)
Potassium: 4 mEq/L (ref 3.5–5.1)
Total Bilirubin: 0.4 mg/dL (ref 0.3–1.2)

## 2013-07-30 LAB — ACETAMINOPHEN LEVEL: Acetaminophen (Tylenol), Serum: <15 ug/mL (ref 10–30)

## 2013-07-30 LAB — ETHANOL: Alcohol, Ethyl (B): 110 mg/dL — ABNORMAL HIGH (ref 0–11)

## 2013-07-30 MED ORDER — FOLIC ACID 1 MG PO TABS
1.0000 mg | ORAL_TABLET | Freq: Every day | ORAL | Status: DC
  Administered 2013-07-30 – 2013-07-31 (×2): 1 mg via ORAL
  Filled 2013-07-30 (×2): qty 1

## 2013-07-30 MED ORDER — ADULT MULTIVITAMIN W/MINERALS CH
1.0000 | ORAL_TABLET | Freq: Every day | ORAL | Status: DC
  Administered 2013-07-30 – 2013-07-31 (×2): 1 via ORAL
  Filled 2013-07-30 (×2): qty 1

## 2013-07-30 MED ORDER — THIAMINE HCL 100 MG/ML IJ SOLN: 100.0000 mg | Freq: Every day | INTRAMUSCULAR | Status: DC

## 2013-07-30 MED ORDER — LORAZEPAM 1 MG PO TABS
1.0000 mg | ORAL_TABLET | Freq: Four times a day (QID) | ORAL | Status: DC | PRN
  Administered 2013-07-30: 1 mg via ORAL
  Filled 2013-07-30: qty 1

## 2013-07-30 MED ORDER — DIPHENHYDRAMINE HCL 25 MG PO CAPS
25.0000 mg | ORAL_CAPSULE | Freq: Four times a day (QID) | ORAL | Status: DC | PRN
  Administered 2013-07-30: 25 mg via ORAL
  Filled 2013-07-30: qty 1

## 2013-07-30 MED ORDER — VITAMIN B-1 100 MG PO TABS
100.0000 mg | ORAL_TABLET | Freq: Every day | ORAL | Status: DC
  Administered 2013-07-30 – 2013-07-31 (×2): 100 mg via ORAL
  Filled 2013-07-30 (×2): qty 1

## 2013-07-30 MED ORDER — LORAZEPAM 2 MG/ML IJ SOLN: 1.0000 mg | Freq: Four times a day (QID) | INTRAMUSCULAR | Status: DC | PRN

## 2013-07-30 NOTE — ED Notes (Addendum)
Pt belongings: In brown paper bag: [facility]charge packet [facility] meds from [facility], grey boxers, orange polo shirt, black wallet ( bus pass, ssi card, DL, [employer] debit card), blue long sleeve shirt, comb, brush, black cell phone, toiletries Personal belonging bag x2: black work boots, blue khaki pants, belt, tan and black polo shirt, camo boxers. Locker # 32

## 2013-07-30 NOTE — ED Notes (Signed)
1 book bag, 1 black bag, 2 coats in locker #31  1 blue duffle bag under desk in TCU

## 2013-07-30 NOTE — ED Notes (Signed)
Bed: WA07 Expected date:  Expected time:  Means of arrival:  Comments: 

## 2013-07-30 NOTE — ED Notes (Signed)
Patient states he needs to detox from alcohol. States he went to [facility] yesterday and they told him to wait until Monday. Patient states that he drank a 12 pack of beer tonight.

## 2013-07-30 NOTE — ED Provider Notes (Signed)
CSN: [id number]     Arrival date & time 07/30/13  0306 History   First MD Initiated Contact with Patient 07/30/13 0309     Chief Complaint  Patient presents with  . Medical Clearance  . Suicidal   HPI  History provided by the patient in recent medical charts. Patient is a 47 year old male who returns with similar complaints of request for help with alcohol addiction as well as suicidal thoughts. Patient was seen for similar symptoms. He had planned to followup with [facility] however he states when he went for his appointment they told him that they were full and had no room for him and he would have to come back on Monday. He admits to continued alcohol use drinking a 12 pack of beers last night and this morning. He also continues to have suicidal thoughts. He has not made any suicide attempts. He denies any HI. Denies any other complaints or symptoms.    Past Medical History  Diagnosis Date  . Osteoarthritis   . Alcoholism /alcohol abuse   . GERD (gastroesophageal reflux disease)   . Scoliosis    Past Surgical History  Procedure Laterality Date  . Appendectomy     Family History  Problem Relation Age of Onset  . Cancer Mother     bladder  . Cancer Father     mesothelioma   History  Substance Use Topics  . Smoking status: Current Every Day Smoker -- 2.00 packs/day for 20 years    Types: Cigarettes  . Smokeless tobacco: Not on file  . Alcohol Use: 15.0 oz/week    30 drink(s) per week     Comment: reports is an alcoholic    Review of Systems  All other systems reviewed and are negative.    Allergies  Percocet  Home Medications   Current Outpatient Rx  Name  Route  Sig  Dispense  Refill  . cetirizine (ZYRTEC) 10 MG tablet   Oral   Take 10 mg by mouth daily as needed for allergies.          Marland Kitchen FLUoxetine (PROZAC) 20 MG capsule   Oral   Take 1 capsule (20 mg total) by mouth daily. For depression.   30 capsule   0   .  traZODone (DESYREL) 50 MG tablet   Oral   Take 1 tablet (50 mg total) by mouth at bedtime. For insomnia.   30 tablet   0    BP 124/107  Pulse 93  Temp(Src) 98.1 F (36.7 C) (Oral)  Resp 20  Ht 5\' 9"  (1.753 m)  Wt 220 lb (99.791 kg)  BMI 32.47 kg/m2  SpO2 96% Physical Exam  Nursing note and vitals reviewed. Constitutional: He is oriented to person, place, and time. He appears well-developed and well-nourished. No distress.  HENT:  Head: Normocephalic.  Cardiovascular: Normal rate and regular rhythm.   Pulmonary/Chest: Effort normal and breath sounds normal. No respiratory distress.  Abdominal: Soft. He exhibits no distension. There is no tenderness. There is no rebound and no guarding.  Neurological: He is alert and oriented to person, place, and time.  Skin: Skin is warm.  Psychiatric: He has a normal mood and affect. He is not actively hallucinating. Thought content is not paranoid. He expresses suicidal ideation. He expresses no homicidal ideation.    ED Course  Procedures   Results for orders placed during the hospital encounter of 07/30/13  URINE RAPID DRUG SCREEN (HOSP PERFORMED)  Result Value Range   Opiates NONE DETECTED  NONE DETECTED   Cocaine NONE DETECTED  NONE DETECTED   Benzodiazepines POSITIVE (*) NONE DETECTED   Amphetamines NONE DETECTED  NONE DETECTED   Tetrahydrocannabinol NONE DETECTED  NONE DETECTED   Barbiturates NONE DETECTED  NONE DETECTED  ACETAMINOPHEN LEVEL      Result Value Range   Acetaminophen (Tylenol), Serum <15.0  10 - 30 ug/mL  CBC      Result Value Range   WBC 11.8 (*) 4.0 - 10.5 K/uL   RBC 4.93  4.22 - 5.81 MIL/uL   Hemoglobin 17.4 (*) 13.0 - 17.0 g/dL   HCT 16.1  09.6 - 04.5 %   MCV 96.8  78.0 - 100.0 fL   MCH 35.3 (*) 26.0 - 34.0 pg   MCHC 36.5 (*) 30.0 - 36.0 g/dL   RDW 40.9  81.1 - 91.4 %   Platelets 326  150 - 400 K/uL  COMPREHENSIVE METABOLIC PANEL      Result Value Range   Sodium 136  135 - 145 mEq/L   Potassium  4.0  3.5 - 5.1 mEq/L   Chloride 99  96 - 112 mEq/L   CO2 24  19 - 32 mEq/L   Glucose, Bld 102 (*) 70 - 99 mg/dL   BUN 9  6 - 23 mg/dL   Creatinine, Ser 7.82  0.50 - 1.35 mg/dL   Calcium 9.3  8.4 - 95.6 mg/dL   Total Protein 7.1  6.0 - 8.3 g/dL   Albumin 3.7  3.5 - 5.2 g/dL   AST 61 (*) 0 - 37 U/L   ALT 70 (*) 0 - 53 U/L   Alkaline Phosphatase 64  39 - 117 U/L   Total Bilirubin 0.4  0.3 - 1.2 mg/dL   GFR calc non Af Amer 72 (*) >90 mL/min   GFR calc Af Amer 84 (*) >90 mL/min  ETHANOL      Result Value Range   Alcohol, Ethyl (B) 110 (*) 0 - 11 mg/dL  SALICYLATE LEVEL      Result Value Range   Salicylate Lvl <2.0 (*) 2.8 - 20.0 mg/dL          MDM   1. Alcohol intoxication   2. Alcohol abuse   3. Suicidal thoughts      Patient seen and evaluated. Patient returns intoxicated with alcohol with similar complaints of suicidal thoughts. States [facility] full and was told to go back on Monday.  Pt waiting to be moved to psych ed.  Holding orders in place.  Angus Seller, PA-C 07/30/13 [phone]

## 2013-07-30 NOTE — ED Notes (Signed)
Pt with security and sitter to go take shower in TCU

## 2013-07-30 NOTE — Consult Note (Signed)
[facility] Face-to-Face Psychiatry Consult   Reason for Consult:  Drinking with suicidal ideation again Referring Physician:  ER MD  Dalton Molina is an 47 y.o. male.  Assessment: AXIS I:  Alcohol Abuse and Depressive Disorder NOS AXIS II:  Deferred AXIS III:   Past Medical History  Diagnosis Date  . Osteoarthritis   . Alcoholism /alcohol abuse   . GERD (gastroesophageal reflux disease)   . Scoliosis    AXIS IV:  economic problems, housing problems, occupational problems and problems related to social environment AXIS V:  51-60 moderate symptoms  Plan:  Recommend psychiatric Inpatient admission when medically cleared.  Subjective:   Dalton Molina is a 47 y.o. male patient admitted with drinking alcohol with suicidal threats.  HPI:  Dalton Molina says when he went to [facility] they told him they had filled his bed with an emergency referral.  So he bought a 12 pack and started drinking and once again felt suicidal.  This has been a pattern,  Recently having been to detox 2 times in the last 30 days.  He says if he is discharged today he cannot say he will not be suicidal and act upon the thoughts.  The stresses are all the same.  Somehow he wants to be in hospitals without a plan to do anything to help himself.  He maintains he cannot help himself and that is why he is back. HPI Elements:   Location:  ER. Quality:  maintains he is suicidal. Severity:  suicidal. Timing:  every time he drinks he becomes suicidal, reportedly. Duration:  several months. Context:  stresses and drinking alcohol.  Past Psychiatric History: Past Medical History  Diagnosis Date  . Osteoarthritis   . Alcoholism /alcohol abuse   . GERD (gastroesophageal reflux disease)   . Scoliosis     reports that he has been smoking Cigarettes.  He has a 40 pack-year smoking history. He does not have any smokeless tobacco history on file. He reports that he drinks about 15.0 ounces of alcohol per week. He reports that he does  not use illicit drugs. Family History  Problem Relation Age of Onset  . Cancer Mother     bladder  . Cancer Father     mesothelioma           Allergies:   Allergies  Allergen Reactions  . Percocet [Oxycodone-Acetaminophen] Anaphylaxis and Rash    ACT Assessment Complete:  Yes:    Educational Status    Risk to Self: Risk to self Is patient at risk for suicide?: Yes Substance abuse history and/or treatment for substance abuse?: Yes  Risk to Others:    Abuse:    Prior Inpatient Therapy:    Prior Outpatient Therapy:    Additional Information:                    Objective: Blood pressure 96/62, pulse 69, temperature 97.5 F (36.4 C), temperature source Oral, resp. rate 16, height 5\' 9"  (1.753 m), weight 99.791 kg (220 lb), SpO2 96.00%.Body mass index is 32.47 kg/(m^2). Results for orders placed during the hospital encounter of 07/30/13 (from the past 72 hour(s))  URINE RAPID DRUG SCREEN (HOSP PERFORMED)     Status: Abnormal   Collection Time    07/30/13  3:32 AM      Result Value Range   Opiates NONE DETECTED  NONE DETECTED   Cocaine NONE DETECTED  NONE DETECTED   Benzodiazepines POSITIVE (*) NONE DETECTED   Amphetamines NONE DETECTED  NONE DETECTED   Tetrahydrocannabinol NONE DETECTED  NONE DETECTED   Barbiturates NONE DETECTED  NONE DETECTED   Comment:            DRUG SCREEN FOR MEDICAL PURPOSES     ONLY.  IF CONFIRMATION IS NEEDED     FOR ANY PURPOSE, NOTIFY LAB     WITHIN 5 DAYS.                LOWEST DETECTABLE LIMITS     FOR URINE DRUG SCREEN     Drug Class       Cutoff (ng/mL)     Amphetamine      1000     Barbiturate      200     Benzodiazepine   200     Tricyclics       300     Opiates          300     Cocaine          300     THC              50  ACETAMINOPHEN LEVEL     Status: None   Collection Time    07/30/13  3:55 AM      Result Value Range   Acetaminophen (Tylenol), Serum <15.0  10 - 30 ug/mL   Comment:            THERAPEUTIC  CONCENTRATIONS VARY     SIGNIFICANTLY. A RANGE OF 10-30     ug/mL MAY BE AN EFFECTIVE     CONCENTRATION FOR MANY PATIENTS.     HOWEVER, SOME ARE BEST TREATED     AT CONCENTRATIONS OUTSIDE THIS     RANGE.     ACETAMINOPHEN CONCENTRATIONS     >150 ug/mL AT 4 HOURS AFTER     INGESTION AND >50 ug/mL AT 12     HOURS AFTER INGESTION ARE     OFTEN ASSOCIATED WITH TOXIC     REACTIONS.  CBC     Status: Abnormal   Collection Time    07/30/13  3:55 AM      Result Value Range   WBC 11.8 (*) 4.0 - 10.5 K/uL   RBC 4.93  4.22 - 5.81 MIL/uL   Hemoglobin 17.4 (*) 13.0 - 17.0 g/dL   HCT 21.3  08.6 - 57.8 %   MCV 96.8  78.0 - 100.0 fL   MCH 35.3 (*) 26.0 - 34.0 pg   MCHC 36.5 (*) 30.0 - 36.0 g/dL   RDW 46.9  62.9 - 52.8 %   Platelets 326  150 - 400 K/uL  COMPREHENSIVE METABOLIC PANEL     Status: Abnormal   Collection Time    07/30/13  3:55 AM      Result Value Range   Sodium 136  135 - 145 mEq/L   Potassium 4.0  3.5 - 5.1 mEq/L   Chloride 99  96 - 112 mEq/L   CO2 24  19 - 32 mEq/L   Glucose, Bld 102 (*) 70 - 99 mg/dL   BUN 9  6 - 23 mg/dL   Creatinine, Ser 4.13  0.50 - 1.35 mg/dL   Calcium 9.3  8.4 - 24.4 mg/dL   Total Protein 7.1  6.0 - 8.3 g/dL   Albumin 3.7  3.5 - 5.2 g/dL   AST 61 (*) 0 - 37 U/L   ALT 70 (*) 0 - 53 U/L   Alkaline Phosphatase 64  39 - 117 U/L   Total Bilirubin 0.4  0.3 - 1.2 mg/dL   GFR calc non Af Amer 72 (*) >90 mL/min   GFR calc Af Amer 84 (*) >90 mL/min   Comment: (NOTE)     The eGFR has been calculated using the CKD EPI equation.     This calculation has not been validated in all clinical situations.     eGFR's persistently <90 mL/min signify possible Chronic Kidney     Disease.  ETHANOL     Status: Abnormal   Collection Time    07/30/13  3:55 AM      Result Value Range   Alcohol, Ethyl (B) 110 (*) 0 - 11 mg/dL   Comment:            LOWEST DETECTABLE LIMIT FOR     SERUM ALCOHOL IS 11 mg/dL     FOR MEDICAL PURPOSES ONLY  SALICYLATE LEVEL     Status:  Abnormal   Collection Time    07/30/13  3:55 AM      Result Value Range   Salicylate Lvl <2.0 (*) 2.8 - 20.0 mg/dL   Labs are reviewed and are pertinent for alcohol.  No current facility-administered medications for this encounter.   Current Outpatient Prescriptions  Medication Sig Dispense Refill  . cetirizine (ZYRTEC) 10 MG tablet Take 10 mg by mouth daily as needed for allergies.       Marland Kitchen FLUoxetine (PROZAC) 20 MG capsule Take 1 capsule (20 mg total) by mouth daily. For depression.  30 capsule  0  . traZODone (DESYREL) 50 MG tablet Take 1 tablet (50 mg total) by mouth at bedtime. For insomnia.  30 tablet  0    Psychiatric Specialty Exam:     Blood pressure 96/62, pulse 69, temperature 97.5 F (36.4 C), temperature source Oral, resp. rate 16, height 5\' 9"  (1.753 m), weight 99.791 kg (220 lb), SpO2 96.00%.Body mass index is 32.47 kg/(m^2).  General Appearance: Casual  Eye Contact::  Good  Speech:  Clear and Coherent and Normal Rate  Volume:  Normal  Mood:  Euthymic  Affect:  Appropriate  Thought Process:  Negative  Orientation:  Full (Time, Place, and Person)  Thought Content:  Negative  Suicidal Thoughts:  Yes.  without intent/plan  Homicidal Thoughts:  No  Memory:  Immediate;   Good Recent;   Good Remote;   Good  Judgement:  Intact  Insight:  Lacking  Psychomotor Activity:  Normal  Concentration:  Good  Recall:  Good  Akathisia:  Negative  Handed:  Right  AIMS (if indicated):     Assets:  Communication Skills Physical Health  Sleep:      Treatment Plan Summary: Daily contact with patient to assess and evaluate symptoms and progress in treatment Medication management will seek inpatient oce again.  Should not need detox but is currently saying he is suicidal even when sober.  Apparently the suicidal thoughts come and go.  Admit to [facility] inpatient if available and if not then whatever facility can be found.  Dalton Molina 07/30/2013 12:44 PM

## 2013-07-30 NOTE — ED Notes (Signed)
Bed: WA06 Expected date:  Expected time:  Means of arrival:  Comments: 

## 2013-07-31 ENCOUNTER — Inpatient Hospital Stay (HOSPITAL_COMMUNITY): Admission: AD | Admit: 2013-07-31 | Payer: Self-pay | Source: Intra-hospital | Admitting: Psychiatry

## 2013-07-31 NOTE — Progress Notes (Signed)
Received phone call from Sue Lush at [facility] @ 1122, patient denied by Dr. Minus Liberty due to substance abuse, stated they do not have services for just substance abuse treatment/detox.  Tomi Bamberger, MHT

## 2013-07-31 NOTE — Consult Note (Signed)
Patient Identification:  Dalton Molina Date of Evaluation:  07/31/2013   History of Present Illness:  This is one of several visits to the ER after a recent discharge from the hospital.  Patient was discharged and went to [facility] who did not admit him for long term care treatment.  He said he was told that he bed was already taken by another client and patient started drinking again.  He states he does not have a place to stay sober and the only way he will stop drinking is to go directly from here to a rehabilitation facility.  He denies SI  But states he cannot guarantee his safety when he leaves the hospital.  We will be seeking placement for patient while we continue to detox and treat him.  Past Psychiatric History:  Alcohol dependence   Past Medical History:     Past Medical History  Diagnosis Date  . Osteoarthritis   . Alcoholism /alcohol abuse   . GERD (gastroesophageal reflux disease)   . Scoliosis        Past Surgical History  Procedure Laterality Date  . Appendectomy      Allergies:  Allergies  Allergen Reactions  . Percocet [Oxycodone-Acetaminophen] Anaphylaxis and Rash    Current Medications:  Prior to Admission medications   Medication Sig Start Date End Date Taking? Authorizing Provider  cetirizine (ZYRTEC) 10 MG tablet Take 10 mg by mouth daily as needed for allergies.    Yes Historical Provider, MD  FLUoxetine (PROZAC) 20 MG capsule Take 1 capsule (20 mg total) by mouth daily. For depression. 07/22/13  Yes Verne Spurr, PA-C  traZODone (DESYREL) 50 MG tablet Take 1 tablet (50 mg total) by mouth at bedtime. For insomnia. 07/22/13  Yes Verne Spurr, PA-C    Social History:    reports that he has been smoking Cigarettes.  He has a 40 pack-year smoking history. He does not have any smokeless tobacco history on file. He reports that he drinks about 15.0 ounces of alcohol per week. He reports that he does not use illicit drugs.   Family History:    Family History   Problem Relation Age of Onset  . Cancer Mother     bladder  . Cancer Father     mesothelioma    Mental Status Examination/Evaluation:Psychiatric Specialty Exam: @PHYSEXAMBYAGE2 @  @ROS @  Blood pressure 120/82, pulse 77, temperature 97.3 F (36.3 C), temperature source Oral, resp. rate 17, height 5\' 9"  (1.753 m), weight 99.791 kg (220 lb), SpO2 97.00%.Body mass index is 32.47 kg/(m^2).  General Appearance: Casual and Fairly Groomed  Eye Contact::  Good  Speech:  Clear and Coherent and Normal Rate  Volume:  Normal  Mood:  Angry, Anxious and Depressed  Affect:  Appropriate and Congruent  Thought Process:  Coherent and Intact  Orientation:  Full (Time, Place, and Person)  Thought Content:  NA  Suicidal Thoughts:  No  Homicidal Thoughts:  No  Memory:  Immediate;   Good Recent;   Good Remote;   Good  Judgement:  Poor  Insight:  Good and Present  Psychomotor Activity:  Normal  Concentration:  Good  Recall:  NA  Akathisia:  NA  Handed:  Right  AIMS (if indicated):     Assets:  Desire for Improvement Financial Resources/Insurance Housing Social Support  Sleep:          DIAGNOSIS:   AXIS I   Alcohol dependence  AXIS II  Deffered  AXIS III See medical notes.  AXIS  IV housing problems, occupational problems, other psychosocial or environmental problems, problems related to legal system/crime, problems related to social environment and problems with primary support group  AXIS V 51-60 moderate symptoms     Assessment/Plan:   Consult and face to face interview with Dr Elsie Saas Recommend acute psych hospitalization for crisis stabilization and safety monitoring We will continue to seek placement at another hospital since we do not have bed at this time We will seek a rehab facility that will accept patient for long  Term treatment    Reviewed the information documented and agree with the treatment plan.  Clint Strupp,[name] 07/31/2013 5:06 PM

## 2013-07-31 NOTE — ED Provider Notes (Signed)
Pt lying reclined, comfortable. Watching [employer]. Pt has normal mood and affect.  No SI.  Is conversant, smiling. No tremor or shakes. No diaphoresis. Normal vital signs.  Pt does not require inpatient detox. No long term rehab beds are available.  Discussed w pt, pt very agreeable to pursuing outpt rehab options - will provide referral guide.  Pt states he may need bus pass, but otherwise ready for d/c.     Suzi Roots, MD 07/31/13 [phone]

## 2013-07-31 NOTE — BH Assessment (Signed)
Faxed clinical information to: [facility]  The following facilities are currently at capacity: [facility]  Herve Haug Ellis Mckinzie Saksa Jr, LPC, NCC Triage Specialist  

## 2013-07-31 NOTE — BHH Counselor (Signed)
Full: [facility] - Andrea - no beds  [facility] - Deanna - full [facility] - Amanda and Grace - no beds till Monday [facility] - full [facility] - Kathy - full [facility] - Danny - full [facility] - full per Kristen - [facility] bed [facility] - wait list only (no new clients or beds available) [facility] - 5-47 years old - Tina - no beds [facility] - left a message (this usually means no beds)  Possibilities [facility] - Lynn - "not sure if we are accepting or not" [facility] - gero accepting; 1 male and 1 male adult - nothing acute per [facility] - Suzie - unsure "your welcome to fax it" [facility]- Bernice 1 ("but I have a lot in my ER so I don't know) [facility] - Barbara - gero only (ages 55 and up) [facility] - on-call service - "fax what you have, we take it case by case") 

## 2013-07-31 NOTE — ED Notes (Signed)
D/C instructions given with understanding stated. Also given bus pass. Ambulatory without difficulty, no complaints voiced. Belongings bag x3 returned. Escorted by mental health tech to front of hospital.

## 2013-08-03 ENCOUNTER — Emergency Department (HOSPITAL_COMMUNITY)
Admission: EM | Admit: 2013-08-03 | Discharge: 2013-08-04 | Disposition: A | Discharge status: Other Acute Inpatient Hospital | Payer: Self-pay | Attending: Emergency Medicine | Admitting: Emergency Medicine

## 2013-08-03 ENCOUNTER — Emergency Department (HOSPITAL_COMMUNITY): Payer: Self-pay

## 2013-08-03 ENCOUNTER — Encounter (HOSPITAL_COMMUNITY): Payer: Self-pay | Admitting: Emergency Medicine

## 2013-08-03 DIAGNOSIS — F17200 Nicotine dependence, unspecified, uncomplicated: Secondary | ICD-10-CM | POA: Insufficient documentation

## 2013-08-03 DIAGNOSIS — F3289 Other specified depressive episodes: Secondary | ICD-10-CM | POA: Insufficient documentation

## 2013-08-03 DIAGNOSIS — R110 Nausea: Secondary | ICD-10-CM | POA: Insufficient documentation

## 2013-08-03 DIAGNOSIS — F1010 Alcohol abuse, uncomplicated: Secondary | ICD-10-CM

## 2013-08-03 DIAGNOSIS — Z8719 Personal history of other diseases of the digestive system: Secondary | ICD-10-CM | POA: Insufficient documentation

## 2013-08-03 DIAGNOSIS — F10229 Alcohol dependence with intoxication, unspecified: Secondary | ICD-10-CM | POA: Insufficient documentation

## 2013-08-03 DIAGNOSIS — Z79899 Other long term (current) drug therapy: Secondary | ICD-10-CM | POA: Insufficient documentation

## 2013-08-03 DIAGNOSIS — M542 Cervicalgia: Secondary | ICD-10-CM | POA: Insufficient documentation

## 2013-08-03 DIAGNOSIS — M1990 Unspecified osteoarthritis, unspecified site: Secondary | ICD-10-CM | POA: Insufficient documentation

## 2013-08-03 DIAGNOSIS — R45851 Suicidal ideations: Secondary | ICD-10-CM | POA: Insufficient documentation

## 2013-08-03 DIAGNOSIS — F329 Major depressive disorder, single episode, unspecified: Secondary | ICD-10-CM

## 2013-08-03 DIAGNOSIS — H5789 Other specified disorders of eye and adnexa: Secondary | ICD-10-CM | POA: Insufficient documentation

## 2013-08-03 HISTORY — DX: Major depressive disorder, single episode, unspecified: F32.9

## 2013-08-03 HISTORY — DX: Depression, unspecified: F32.A

## 2013-08-03 LAB — CBC
MCHC: 36.7 g/dL — ABNORMAL HIGH (ref 30.0–36.0)
MCV: 95.5 fL (ref 78.0–100.0)
Platelets: 295 10*3/uL (ref 150–400)
RDW: 12.6 % (ref 11.5–15.5)
WBC: 10.6 10*3/uL — ABNORMAL HIGH (ref 4.0–10.5)

## 2013-08-03 LAB — COMPREHENSIVE METABOLIC PANEL
AST: 66 U/L — ABNORMAL HIGH (ref 0–37)
Albumin: 3.8 g/dL (ref 3.5–5.2)
BUN: 6 mg/dL (ref 6–23)
Chloride: 98 mEq/L (ref 96–112)
Creatinine, Ser: 0.99 mg/dL (ref 0.50–1.35)
Potassium: 3.7 mEq/L (ref 3.5–5.1)
Total Bilirubin: 0.4 mg/dL (ref 0.3–1.2)
Total Protein: 7.2 g/dL (ref 6.0–8.3)

## 2013-08-03 LAB — ETHANOL: Alcohol, Ethyl (B): 162 mg/dL — ABNORMAL HIGH (ref 0–11)

## 2013-08-03 LAB — ACETAMINOPHEN LEVEL: Acetaminophen (Tylenol), Serum: <15 ug/mL (ref 10–30)

## 2013-08-03 MED ORDER — IBUPROFEN 200 MG PO TABS: 600.0000 mg | ORAL_TABLET | Freq: Three times a day (TID) | ORAL | Status: DC | PRN

## 2013-08-03 MED ORDER — FLUOXETINE HCL 20 MG PO CAPS
20.0000 mg | ORAL_CAPSULE | Freq: Every day | ORAL | Status: DC
  Filled 2013-08-03: qty 1

## 2013-08-03 MED ORDER — ONDANSETRON 4 MG PO TBDP
4.0000 mg | ORAL_TABLET | Freq: Once | ORAL | Status: AC
  Administered 2013-08-03: 4 mg via ORAL
  Filled 2013-08-03: qty 1

## 2013-08-03 MED ORDER — NICOTINE 21 MG/24HR TD PT24: 21.0000 mg | MEDICATED_PATCH | Freq: Every day | TRANSDERMAL | Status: DC

## 2013-08-03 MED ORDER — ONDANSETRON HCL 4 MG PO TABS
4.0000 mg | ORAL_TABLET | Freq: Three times a day (TID) | ORAL | Status: DC | PRN
Start: 2013-08-03 — End: 2013-08-04

## 2013-08-03 NOTE — ED Notes (Signed)
Pt via EMS reports SI, attempted to hang himself, reports ETOH, A/V/H of "his mother and father."  Pt alert and oriented, denies pain, calm and cooperative in triage.

## 2013-08-03 NOTE — ED Provider Notes (Signed)
Medical screening examination/treatment/procedure(s) were performed by non-physician practitioner and as supervising physician I was immediately available for consultation/collaboration.   Junius Argyle, MD 08/03/13 [phone]

## 2013-08-03 NOTE — ED Provider Notes (Signed)
CSN: [id number]     Arrival date & time 08/03/13  2156 History  This chart was scribed for non-physician practitioner, Earley Favor, FNP working with Junius Argyle, MD by Greggory Stallion, ED scribe. This patient was seen in room WTR3/WLPT3 and the patient's care was started at 10:49 PM.   Chief Complaint  Patient presents with  . Medical Clearance   The history is provided by the patient. No language interpreter was used.    HPI Comments: Dalton Molina is a 47 y.o. male who presents to the Emergency Department complaining of trying to hand himself with a belt earlier tonight. He states he was outside on the porch when he was trying to hang himself. Pt states it didn't work so he called EMS. He states he has been drinking alcohol all day long. He drinks alcohol everyday. Pt is now complaining of being nauseated. Pt denies seizure activity. He denies recreational drug use.   Past Medical History  Diagnosis Date  . Osteoarthritis   . Alcoholism /alcohol abuse   . GERD (gastroesophageal reflux disease)   . Scoliosis    Past Surgical History  Procedure Laterality Date  . Appendectomy     Family History  Problem Relation Age of Onset  . Cancer Mother     bladder  . Cancer Father     mesothelioma   History  Substance Use Topics  . Smoking status: Current Every Day Smoker -- 2.00 packs/day for 20 years    Types: Cigarettes  . Smokeless tobacco: Not on file  . Alcohol Use: 15.0 oz/week    30 drink(s) per week     Comment: reports is an alcoholic    Review of Systems  Constitutional: Negative for fever.  HENT: Positive for neck pain. Negative for facial swelling.   Eyes: Positive for redness. Negative for visual disturbance.  Gastrointestinal: Positive for nausea.  Neurological: Negative for dizziness, seizures, weakness and headaches.  Psychiatric/Behavioral: Positive for suicidal ideas.  All other systems reviewed and are negative.    Allergies  Percocet  Home  Medications   Current Outpatient Rx  Name  Route  Sig  Dispense  Refill  . cetirizine (ZYRTEC) 10 MG tablet   Oral   Take 10 mg by mouth daily as needed for allergies.          Marland Kitchen FLUoxetine (PROZAC) 20 MG capsule   Oral   Take 1 capsule (20 mg total) by mouth daily. For depression.   30 capsule   0   . traZODone (DESYREL) 50 MG tablet   Oral   Take 1 tablet (50 mg total) by mouth at bedtime. For insomnia.   30 tablet   0    BP 97/72  Pulse 80  Temp(Src) 98.2 F (36.8 C) (Oral)  Resp 18  SpO2 91% Physical Exam  Nursing note and vitals reviewed. Constitutional: He is oriented to person, place, and time. He appears well-developed and well-nourished. No distress.  HENT:  Head: Normocephalic and atraumatic.  Mouth/Throat: Oropharynx is clear and moist.  Eyes: EOM are normal.  Eyes are bloodshot but there's no petechiae.   Neck: Normal range of motion. Neck supple. No tracheal deviation present.  No abrasions or contusions to his neck.   Cardiovascular: Normal rate.   Pulmonary/Chest: Effort normal. No respiratory distress.  Abdominal: Soft.  Musculoskeletal: Normal range of motion.  Neurological: He is alert and oriented to person, place, and time.  Skin: Skin is warm and dry. No rash noted.  No erythema.  Psychiatric: His speech is delayed. He is slowed. He expresses inappropriate judgment. He exhibits a depressed mood. He expresses suicidal ideation. He expresses suicidal plans.    ED Course  Procedures (including critical care time)  DIAGNOSTIC STUDIES:   COORDINATION OF CARE: 10:53 PM-Discussed treatment plan which includes moving to the back with pt at bedside and pt agreed to plan.   Labs Review Labs Reviewed  CBC - Abnormal; Notable for the following:    WBC 10.6 (*)    Hemoglobin 18.1 (*)    MCH 35.1 (*)    MCHC 36.7 (*)    All other components within normal limits  COMPREHENSIVE METABOLIC PANEL - Abnormal; Notable for the following:    Glucose, Bld  109 (*)    AST 66 (*)    ALT 64 (*)    All other components within normal limits  ETHANOL - Abnormal; Notable for the following:    Alcohol, Ethyl (B) 162 (*)    All other components within normal limits  SALICYLATE LEVEL - Abnormal; Notable for the following:    Salicylate Lvl <2.0 (*)    All other components within normal limits  ACETAMINOPHEN LEVEL  URINE RAPID DRUG SCREEN (HOSP PERFORMED)   Imaging Review No results found.  MDM  No diagnosis found.  Will obtain soft tissue neck and labs  Is reviewed.  There is no indication of any strangulation injury to her norm.  Neck bruising.  His alcohol level is only 163 of feel it is safe for him to be moved to the sacrum.  Emergency department for further evaluation for his  Suicide attempt     I personally performed the services described in this documentation, which was scribed in my presence. The recorded information has been reviewed and is accurate.  Arman Filter, NP 08/03/13 2351

## 2013-08-04 ENCOUNTER — Inpatient Hospital Stay (HOSPITAL_COMMUNITY)
Admission: AD | Admit: 2013-08-04 | Discharge: 2013-08-06 | DRG: 897 | Disposition: A | Discharge status: Home / Self Care | Payer: Self-pay | Source: Intra-hospital | Attending: Psychiatry | Admitting: Psychiatry

## 2013-08-04 ENCOUNTER — Encounter (HOSPITAL_COMMUNITY): Payer: Self-pay | Admitting: *Deleted

## 2013-08-04 ENCOUNTER — Encounter (HOSPITAL_COMMUNITY): Payer: Self-pay | Admitting: Behavioral Health

## 2013-08-04 DIAGNOSIS — F32A Depression, unspecified: Secondary | ICD-10-CM | POA: Diagnosis present

## 2013-08-04 DIAGNOSIS — F329 Major depressive disorder, single episode, unspecified: Secondary | ICD-10-CM

## 2013-08-04 DIAGNOSIS — F321 Major depressive disorder, single episode, moderate: Secondary | ICD-10-CM | POA: Diagnosis present

## 2013-08-04 DIAGNOSIS — F1020 Alcohol dependence, uncomplicated: Principal | ICD-10-CM | POA: Diagnosis present

## 2013-08-04 DIAGNOSIS — K219 Gastro-esophageal reflux disease without esophagitis: Secondary | ICD-10-CM | POA: Diagnosis present

## 2013-08-04 DIAGNOSIS — F411 Generalized anxiety disorder: Secondary | ICD-10-CM | POA: Diagnosis present

## 2013-08-04 DIAGNOSIS — F3289 Other specified depressive episodes: Secondary | ICD-10-CM

## 2013-08-04 DIAGNOSIS — Z79899 Other long term (current) drug therapy: Secondary | ICD-10-CM

## 2013-08-04 LAB — RAPID URINE DRUG SCREEN, HOSP PERFORMED
Barbiturates: NOT DETECTED
Benzodiazepines: POSITIVE — AB
Cocaine: NOT DETECTED
Opiates: NOT DETECTED

## 2013-08-04 MED ORDER — CHLORDIAZEPOXIDE HCL 25 MG PO CAPS
25.0000 mg | ORAL_CAPSULE | ORAL | Status: DC
  Administered 2013-08-06: 25 mg via ORAL
  Filled 2013-08-04: qty 1

## 2013-08-04 MED ORDER — LORATADINE 10 MG PO TABS
10.0000 mg | ORAL_TABLET | Freq: Every day | ORAL | Status: DC
  Administered 2013-08-04 – 2013-08-06 (×3): 10 mg via ORAL
  Filled 2013-08-04 (×6): qty 1

## 2013-08-04 MED ORDER — ACETAMINOPHEN 325 MG PO TABS: 650.0000 mg | ORAL_TABLET | Freq: Four times a day (QID) | ORAL | Status: DC | PRN

## 2013-08-04 MED ORDER — CHLORDIAZEPOXIDE HCL 25 MG PO CAPS
25.0000 mg | ORAL_CAPSULE | Freq: Three times a day (TID) | ORAL | Status: AC
  Administered 2013-08-05 (×3): 25 mg via ORAL
  Filled 2013-08-04 (×3): qty 1

## 2013-08-04 MED ORDER — HYDROXYZINE HCL 25 MG PO TABS: 25.0000 mg | ORAL_TABLET | Freq: Four times a day (QID) | ORAL | Status: DC | PRN

## 2013-08-04 MED ORDER — LOPERAMIDE HCL 2 MG PO CAPS
2.0000 mg | ORAL_CAPSULE | ORAL | Status: DC | PRN
Start: 2013-08-04 — End: 2013-08-06

## 2013-08-04 MED ORDER — CHLORDIAZEPOXIDE HCL 25 MG PO CAPS: 25.0000 mg | ORAL_CAPSULE | Freq: Every day | ORAL | Status: DC

## 2013-08-04 MED ORDER — ONDANSETRON 4 MG PO TBDP: 4.0000 mg | ORAL_TABLET | Freq: Four times a day (QID) | ORAL | Status: DC | PRN

## 2013-08-04 MED ORDER — ALUM & MAG HYDROXIDE-SIMETH 200-200-20 MG/5ML PO SUSP: 30.0000 mL | ORAL | Status: DC | PRN

## 2013-08-04 MED ORDER — ADULT MULTIVITAMIN W/MINERALS CH
1.0000 | ORAL_TABLET | Freq: Every day | ORAL | Status: DC
  Administered 2013-08-04 – 2013-08-06 (×3): 1 via ORAL
  Filled 2013-08-04 (×5): qty 1

## 2013-08-04 MED ORDER — VITAMIN B-1 100 MG PO TABS
100.0000 mg | ORAL_TABLET | Freq: Every day | ORAL | Status: DC
  Administered 2013-08-05 – 2013-08-06 (×2): 100 mg via ORAL
  Filled 2013-08-04 (×4): qty 1

## 2013-08-04 MED ORDER — THIAMINE HCL 100 MG/ML IJ SOLN
100.0000 mg | Freq: Once | INTRAMUSCULAR | Status: AC
  Administered 2013-08-04: 100 mg via INTRAMUSCULAR

## 2013-08-04 MED ORDER — CHLORDIAZEPOXIDE HCL 25 MG PO CAPS: 25.0000 mg | ORAL_CAPSULE | Freq: Four times a day (QID) | ORAL | Status: DC | PRN

## 2013-08-04 MED ORDER — CHLORDIAZEPOXIDE HCL 25 MG PO CAPS
25.0000 mg | ORAL_CAPSULE | Freq: Once | ORAL | Status: AC
  Administered 2013-08-04: 25 mg via ORAL
  Filled 2013-08-04: qty 1

## 2013-08-04 MED ORDER — CHLORDIAZEPOXIDE HCL 25 MG PO CAPS
25.0000 mg | ORAL_CAPSULE | Freq: Four times a day (QID) | ORAL | Status: AC
  Administered 2013-08-04 (×3): 25 mg via ORAL
  Filled 2013-08-04 (×3): qty 1

## 2013-08-04 MED ORDER — FLUOXETINE HCL 20 MG PO CAPS
20.0000 mg | ORAL_CAPSULE | Freq: Every day | ORAL | Status: DC
  Administered 2013-08-04 – 2013-08-06 (×3): 20 mg via ORAL
  Filled 2013-08-04 (×5): qty 1
  Filled 2013-08-04: qty 14

## 2013-08-04 MED ORDER — TRAZODONE HCL 50 MG PO TABS
50.0000 mg | ORAL_TABLET | Freq: Every day | ORAL | Status: DC
  Filled 2013-08-04: qty 1
  Filled 2013-08-04: qty 14
  Filled 2013-08-04 (×2): qty 1

## 2013-08-04 MED ORDER — MAGNESIUM HYDROXIDE 400 MG/5ML PO SUSP: 30.0000 mL | Freq: Every day | ORAL | Status: DC | PRN

## 2013-08-04 NOTE — BHH Counselor (Signed)
Adult Psychosocial Assessment Update Interdisciplinary Team  Previous [facility] admissions/discharges:  Admissions Discharges  Date: 08/04/13 Date: unknown at this time.   Date: 07/18/13 Date: 07/22/13  Date: Date:  Date: Date:  Date: Date:   Changes since the last Psychosocial Assessment (including adherence to outpatient mental health and/or substance abuse treatment, situational issues contributing to decompensation and/or relapse). Dalton Molina is a 47 y.o. male presenting to [facility] with SI/Depression/SA. "Seems hopeless", pt tried to hang self today. Pt reports feeling SI for several weeks. Pt.'s stressors: (1) job loss since November 2013;(2) homelessness; (3) loneliness;(4) financial;(5) chronic SA. Pt has tried to harm self twice in the past, both by hanging. Pt has 2 past inpt admissions with [facility] and [facility]; recent d/c from [facility]. Pt also admits problems with alcohol: consumes 5-40's, daily, last intake was 08/03/16, pt drank 5-40's. Pt says he lost his job because of his chronic alcoholism, has been drinking heavily since being unemployed in November, denies any other substance use. Pt c/o w/d sxs: chills, headache, no issues with seizures/blackouts, states does experience substance induced halluc(sporadic). Pt left [facility] after conflict with roommate and relapsed. Pt reporting SI and depression upon admission.              Discharge Plan 1. Will you be returning to the same living situation after discharge?   Yes: No:      If no, what is your plan?    Pt discharge plan unknown at this time. Pt hoping to get back into [facility].        2. Would you like a referral for services when you are discharged? Yes:     If yes, for what services?  No:       Pt hoping to get back into [facility] and plans to followup at [facility] for med management.        Summary and Recommendations (to be completed by the evaluator) Pt is 47 year old male  who reports being homeless in [place], [place]. He recently left [facility] after verbal altercation with roommate. Pt immediately relapsed and is hoping to detox and be accepted back into [facility] program. Pt has strong support from AA/sponsor. Recommendations for pt include: crisis stabilization, therapeutic milieu, encourage group attendance and participation, librium taper for withdrawals, medication management for mood stabilization, and development of comprehensive mental wellness/sobriety plan.                        Signature:  Trula Slade, 08/04/2013 12:31 PM

## 2013-08-04 NOTE — Progress Notes (Signed)
Adult Psychoeducational Group Note  Date:  08/04/2013 Time:  9:38 PM  Group Topic/Focus:  NA Group  Participation Level:  Active  Participation Quality:  Appropriate  Affect:  Appropriate  Cognitive:  Appropriate  Insight: Appropriate  Engagement in Group:  Engaged  Modes of Intervention:  Discussion  Additional Comments:  Group  Octavio Manns 08/04/2013, 9:38 PM

## 2013-08-04 NOTE — BHH Suicide Risk Assessment (Signed)
Suicide Risk Assessment  Admission Assessment     Nursing information obtained from:    Demographic factors:    Current Mental Status:    Loss Factors:    Historical Factors:    Risk Reduction Factors:     CLINICAL FACTORS:   Depression:   Comorbid alcohol abuse/dependence Alcohol/Substance Abuse/Dependencies  COGNITIVE FEATURES THAT CONTRIBUTE TO RISK:  Closed-mindedness Polarized thinking Thought constriction (tunnel vision)    SUICIDE RISK:   Moderate:  Frequent suicidal ideation with limited intensity, and duration, some specificity in terms of plans, no associated intent, good self-control, limited dysphoria/symptomatology, some risk factors present, and identifiable protective factors, including available and accessible social support.  PLAN OF CARE: Supportive approach/coping skills/relapse prevention                               Librium detox/reassess and address the co morbidites  I certify that inpatient services furnished can reasonably be expected to improve the patient's condition.  Francess Mullen A 08/04/2013, 4:33 PM

## 2013-08-04 NOTE — Tx Team (Signed)
Initial Interdisciplinary Treatment Plan  PATIENT STRENGTHS: (choose at least two) Ability for insight General fund of knowledge Motivation for treatment/growth  PATIENT STRESSORS: Financial difficulties Occupational concerns Substance abuse   PROBLEM LIST: Problem List/Patient Goals Date to be addressed Date deferred Reason deferred Estimated date of resolution  Substance abuse      Hopelessness      Unemployment                                           DISCHARGE CRITERIA:  Ability to meet basic life and health needs Improved stabilization in mood, thinking, and/or behavior Motivation to continue treatment in a less acute level of care  PRELIMINARY DISCHARGE PLAN: Attend aftercare/continuing care group Attend 12-step recovery group Outpatient therapy  PATIENT/FAMIILY INVOLVEMENT: This treatment plan has been presented to and reviewed with the patient, Dalton Molina.  The patient and family have been given the opportunity to ask questions and make suggestions.  Harold Barban E 08/04/2013, 10:17 AM

## 2013-08-04 NOTE — Progress Notes (Addendum)
D: Patient in his room on approach.  Patient states he is getting adjusted to the unit.  Patient states he is currently homeless and states he has lost a lot due to drinking.  Patient states he lost his job last year due to drinking as well.  Patient denies SI/HI and denies AVH A: Staff to monitor Q 15 mins for safety.  Encouragement and support offered.  Scheduled medications administered per orders.  Patient refused sleep medication tonight R: Patient remains safe on the unit.  Patient attended group tonight. Patient visible on the unit and interacting with peers.

## 2013-08-04 NOTE — Progress Notes (Signed)
Admission Note  D: Patient appropriate and cooperative with staff. During admission, patient's affect/mood is anxious. Patient verbalized that he's been drinking 4-5 40 oz. beers daily and that his last drink was on yesterday. He doesn't report any depression, but patient voiced that he's very frustrated with himself because he can't seem to stop drinking and unemployed partially due to alcohol.  A: Support and encouragement provided to patient. Oriented patient to the unit and informed about the unit rules/policies. Initiated Q15 minute checks for safety.  R: Patient receptive. Denies SI/HI/AVH. Patient remains safe on the unit.

## 2013-08-04 NOTE — Progress Notes (Signed)
NUTRITION ASSESSMENT  Pt identified as at risk on the Malnutrition Screen Tool  INTERVENTION: 1. Educated patient on the importance of nutrition and encouraged intake of food and beverages. 2. Discussed weight goals.   NUTRITION DIAGNOSIS: Limited access to food and water related to homeless AEB patient report.  Goal: Pt to meet >/= 90% of their estimated nutrition needs.  Monitor:  PO intake  Assessment:  Patient known to me from last admit.  Admit with SA and continued ETOH abuse.  Now homeless.  Poor intake prior to admit secondary to this.  Weight increased per chart.  47 y.o. male  Height: Ht Readings from Last 1 Encounters:  08/04/13 6\' 2"  (1.88 m)    Weight: Wt Readings from Last 1 Encounters:  08/04/13 232 lb (105.235 kg)    Weight Hx: Wt Readings from Last 10 Encounters:  08/04/13 232 lb (105.235 kg)  07/30/13 220 lb (99.791 kg)  07/25/13 220 lb (99.791 kg)  07/18/13 220 lb (99.791 kg)  07/16/13 220 lb (99.791 kg)    BMI:  Body mass index is 29.77 kg/(m^2). Pt meets criteria for overweight based on current BMI.  Estimated Nutritional Needs: Kcal: 25-30 kcal/kg Protein: > 1 gram protein/kg Fluid: 1 ml/kcal  Diet Order: General Pt is also offered choice of unit snacks mid-morning and mid-afternoon.  Pt is eating as desired.   Lab results and medications reviewed.   Oran Rein, RD, LDN Clinical Inpatient Dietitian Pager:  [phone] Weekend and after hours pager:  [phone]

## 2013-08-04 NOTE — BH Assessment (Signed)
Assessment Note  Dalton Molina is a 47 y.o. male presenting to [facility] with SI/Depression/SA.  "Seems hopeless", pt tried to hang self today. Pt reports feeling SI for several weeks.  Pt.'s stressors: (1) job loss since November 2013;(2) homelessness; (3) loneliness;(4) financial;(5) chronic SA.  Pt has tried to harm self twice in the past, both by hanging.  Pt has 2 past inpt admissions with [facility] and [facility]; recent d/c from [facility].  Pt also admits problems with alcohol: consumes 5-40's, daily, last intake was 08/03/16, pt drank 5-40's.  Pt says he lost his job because of his chronic alcoholism, has been drinking heavily since being unemployed in November, denies any other substance use.  Pt c/o w/d sxs: chills, headache, no issues with seizures/blackouts, states does experience substance induced halluc(sporadic)/       Axis I: MDD, recurrent, severe; Alcohol dependence  Axis II: Deferred Axis III:  Past Medical History  Diagnosis Date  . Osteoarthritis   . Alcoholism /alcohol abuse   . GERD (gastroesophageal reflux disease)   . Scoliosis   . Depression    Axis IV: economic problems, housing problems, occupational problems, other psychosocial or environmental problems, problems related to social environment and problems with primary support group Axis V: 31-40 impairment in reality testing  Past Medical History:  Past Medical History  Diagnosis Date  . Osteoarthritis   . Alcoholism /alcohol abuse   . GERD (gastroesophageal reflux disease)   . Scoliosis   . Depression     Past Surgical History  Procedure Laterality Date  . Appendectomy      Family History:  Family History  Problem Relation Age of Onset  . Cancer Mother     bladder  . Cancer Father     mesothelioma    Social History:  reports that he has been smoking Cigarettes.  He has a 40 pack-year smoking history. He does not have any smokeless tobacco history on file. He reports that he drinks about 15.0 ounces of alcohol per  week. He reports that he does not use illicit drugs.  Additional Social History:  Alcohol / Drug Use Pain Medications: See MAR  Prescriptions: See MAR  Over the Counter: See MAR  History of alcohol / drug use?: Yes Longest period of sobriety (when/how long): Unk  Negative Consequences of Use: Work / School;Personal relationships;Financial Withdrawal Symptoms: Fever / Chills;Other (Comment) (Headache ) Substance #2 Name of Substance 2: Alcohol--Beer 2 - Age of First Use: Teens  2 - Amount (size/oz): 5-40's  2 - Frequency: Daily  2 - Duration: On-going  2 - Last Use / Amount: 08/03/13  CIWA: CIWA-Ar BP: 114/62 mmHg Pulse Rate: 84 Nausea and Vomiting: no nausea and no vomiting Tactile Disturbances: none Tremor: no tremor Auditory Disturbances: not present Paroxysmal Sweats: barely perceptible sweating, palms moist Visual Disturbances: not present Anxiety: no anxiety, at ease Headache, Fullness in Head: mild Agitation: normal activity Orientation and Clouding of Sensorium: oriented and can do serial additions CIWA-Ar Total: 3 COWS:    Allergies:  Allergies  Allergen Reactions  . Percocet [Oxycodone-Acetaminophen] Anaphylaxis and Rash    Home Medications:  (Not in a hospital admission)  OB/GYN Status:  No LMP for male patient.  General Assessment Data Location of Assessment: [facility] ED Is this a Tele or Face-to-Face Assessment?: Tele Assessment Is this an Initial Assessment or a Re-assessment for this encounter?: Initial Assessment Living Arrangements: Other (Comment) (Homeless ) Can pt return to current living arrangement?: Yes Admission Status: Voluntary Is patient  capable of signing voluntary admission?: Yes Transfer from: Acute Hospital Referral Source: MD  Medical Screening Exam [facility] Walk-in ONLY) Medical Exam completed: No Reason for MSE not completed: Other: (None )  [facility] Crisis Care Plan Living Arrangements: Other (Comment) (Homeless ) Name of Psychiatrist:  None  Name of Therapist: None   Education Status Is patient currently in school?: No Current Grade: None  Highest grade of school patient has completed: None  Name of school: None  Contact person: None   Risk to self Suicidal Ideation: Yes-Currently Present Suicidal Intent: Yes-Currently Present Is patient at risk for suicide?: Yes Suicidal Plan?: Yes-Currently Present Specify Current Suicidal Plan: Hang self  Access to Means: Yes Specify Access to Suicidal Means: Rope, Belts  What has been your use of drugs/alcohol within the last 12 months?: Abusing: alcohol  Previous Attempts/Gestures: Yes How many times?: 2 Other Self Harm Risks: None  Triggers for Past Attempts: Unpredictable Intentional Self Injurious Behavior: None Family Suicide History: Yes Recent stressful life event(s): Financial Problems;Job Loss;Other (Comment) (Homeless ) Persecutory voices/beliefs?: No Depression: Yes Depression Symptoms: Isolating;Fatigue;Loss of interest in usual pleasures;Feeling worthless/self pity Substance abuse history and/or treatment for substance abuse?: Yes Suicide prevention information given to non-admitted patients: Not applicable  Risk to Others Homicidal Ideation: No Thoughts of Harm to Others: No Current Homicidal Intent: No Current Homicidal Plan: No Access to Homicidal Means: No Identified Victim: None  History of harm to others?: No Assessment of Violence: None Noted Violent Behavior Description: None  Does patient have access to weapons?: No Criminal Charges Pending?: No Does patient have a court date: No  Psychosis Hallucinations: None noted Delusions: None noted  Mental Status Report Appear/Hygiene: Disheveled Eye Contact: Poor Motor Activity: Unremarkable Speech: Logical/coherent;Soft Level of Consciousness: Alert Mood: Depressed;Anhedonia;Sad Affect: Depressed;Sad Anxiety Level: None Thought Processes: Coherent;Relevant Judgement:  Impaired Orientation: Person;Place;Time;Situation Obsessive Compulsive Thoughts/Behaviors: None  Cognitive Functioning Concentration: Normal Memory: Recent Intact;Remote Intact IQ: Average Insight: Poor Impulse Control: Poor Appetite: Fair Weight Loss: 0 Weight Gain: 0 Sleep: Decreased Total Hours of Sleep: 4 Vegetative Symptoms: None  ADLScreening [facility] Assessment Services) Patient's cognitive ability adequate to safely complete daily activities?: Yes Patient able to express need for assistance with ADLs?: Yes Independently performs ADLs?: Yes (appropriate for developmental age)  Prior Inpatient Therapy Prior Inpatient Therapy: Yes Prior Therapy Dates: 2014 Prior Therapy Facilty/Provider(s): [facility], [facility]  Reason for Treatment: Detox, Depression   Prior Outpatient Therapy Prior Outpatient Therapy: No Prior Therapy Dates: None  Prior Therapy Facilty/Provider(s): None  Reason for Treatment: None   ADL Screening (condition at time of admission) Patient's cognitive ability adequate to safely complete daily activities?: Yes Is the patient deaf or have difficulty hearing?: No Does the patient have difficulty seeing, even when wearing glasses/contacts?: No Does the patient have difficulty concentrating, remembering, or making decisions?: No Patient able to express need for assistance with ADLs?: Yes Does the patient have difficulty dressing or bathing?: No Independently performs ADLs?: Yes (appropriate for developmental age) Does the patient have difficulty walking or climbing stairs?: No Weakness of Legs: None Weakness of Arms/Hands: None  Home Assistive Devices/Equipment Home Assistive Devices/Equipment: None  Therapy Consults (therapy consults require a physician order) PT Evaluation Needed: No OT Evalulation Needed: No SLP Evaluation Needed: No Abuse/Neglect Assessment (Assessment to be complete while patient is alone) Physical Abuse: Denies Verbal Abuse:  Denies Sexual Abuse: Denies Exploitation of patient/patient's resources: Denies Self-Neglect: Denies Values / Beliefs Cultural Requests During Hospitalization: None Spiritual Requests During Hospitalization: None Consults  Spiritual Care Consult Needed: No Social Work Consult Needed: No Merchant navy officer (For Healthcare) Advance Directive: Patient does not have advance directive;Patient would not like information Pre-existing out of facility DNR order (yellow form or pink MOST form): No Nutrition Screen- [facility] Adult/[facility] Patient's home diet: Regular  Additional Information 1:1 In Past 12 Months?: No CIRT Risk: No Elopement Risk: No Does patient have medical clearance?: Yes     Disposition:  Disposition Initial Assessment Completed for this Encounter: Yes Disposition of Patient: Inpatient treatment program;Referred to (Pt accepted by Jorje Guild, [id number]) Type of inpatient treatment program: Adult Type of outpatient treatment: Adult Patient referred to: Other (Comment) (Pt accepted by Ileana Roup)  On Site Evaluation by:   Reviewed with Physician:    Murrell Redden 08/04/2013 6:12 AM

## 2013-08-04 NOTE — BHH Counselor (Signed)
Writer consulted with the nurse working with the patient in order to obtain the support paperwork.  The nurse informed me that she would call once the support paperwork has been faxed.

## 2013-08-04 NOTE — BHH Group Notes (Signed)
BHH LCSW Group Therapy  08/04/2013 3:05 PM  Type of Therapy:  Group Therapy  Participation Level:  Active  Participation Quality:  Attentive  Affect:  Appropriate  Cognitive:  Alert and Oriented  Insight:  Improving  Engagement in Therapy:  Engaged  Modes of Intervention:  Confrontation, Discussion, Education, Socialization and Support  Summary of Progress/Problems: Emotion Regulation: This group focused on both positive and negative emotion identification and allowed group members to process ways to identify feelings, regulate negative emotions, and find healthy ways to manage internal/external emotions. Group members were asked to reflect on a time when their reaction to an emotion led to a negative outcome and explored how alternative responses using emotion regulation would have benefited them. Group members were also asked to discuss a time when emotion regulation was utilized when a negative emotion was experienced. Dalton Molina was attentive and engaged throughout today's group. He stated that the emotion he struggles most with is hopelessness. Dalton Molina was able to describe what Hopelessness feels like both physically and emotionally. Dalton Molina shows progress in the group setting AEB his ability to actively participate in group discussion and share his personal experience in coping with hopelessness with alcohol. Dalton Molina shows improving insight AEB his ability to identify obstacles that get in the way of recovery, triggers for feeling negative emotionos, and positive ways to cope with negative emotions. "I plan to go back to [facility] and focus on my recovery, get back into meetings, and focus 100% on my sobriety. I also want to restart my spiritual journey in order to figure out my beliefs and who I am."    Smart, Turner Kunzman 08/04/2013, 3:05 PM
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
## 2013-08-04 NOTE — ED Notes (Signed)
Bed: UJ81 Expected date:  Expected time:  Means of arrival:  Comments: RM 28

## 2013-08-04 NOTE — ED Notes (Signed)
Pt sent to [facility] w/ Engineer, materials, NT and 1 bag of pt belongings from locker 28.

## 2013-08-04 NOTE — Progress Notes (Signed)
Adult Psychoeducational Group Note  Date:  08/04/2013 Time:  1:51 PM  Group Topic/Focus:  Managing Feelings:   The focus of this group is to identify what feelings patients have difficulty handling and develop a plan to handle them in a healthier way upon discharge.  Participation Level:  Active  Participation Quality:  Appropriate, Attentive and Sharing  Affect:  Appropriate  Cognitive:  Alert, Appropriate and Oriented  Insight: Good, Improving and Lacking  Engagement in Group:  Engaged  Modes of Intervention:  Discussion, Education, Socialization and Support  Additional Comments:  Pt engaged in group appropriately and shared the feeling of "overwhelmed" as being the primary feeling he is experiencing. "I lost my job and they had given me lots of chances". Pt had only been on the unit for less than an hour when he attended group.  Reynolds Bowl 08/04/2013, 1:51 PM

## 2013-08-04 NOTE — Progress Notes (Signed)
CSW completed support paperwork with patient. Pt accepted to [facility] to Dr. Dub Mikes. RN and EDP aware. CSW will speak with NP to discuss other orders needed as patient has been transported to [facility].   Catha Gosselin, LCSW [phone]  ED CSW .08/04/2013 8:20am

## 2013-08-04 NOTE — B[facility] Counselor (Signed)
Pt accepted by Jorje Guild, PA, [facility].  Next ACT will need to complete support paper work.

## 2013-08-04 NOTE — Tx Team (Signed)
Interdisciplinary Treatment Plan Update (Adult)  Date: 08/04/2013   Time Reviewed: 10:25 AM  Progress in Treatment:  Attending groups: Just admitted.  Participating in groups:  Just admitted.  Taking medication as prescribed: Yes  Tolerating medication: Yes  Family/Significant othe contact made: Not yet. SPE required for this pt.  Patient understands diagnosis: Yes, AEB seeking treatment for SI, depression, and ETOH detox.  Discussing patient identified problems/goals with staff: Yes  Medical problems stabilized or resolved: Yes  Denies suicidal/homicidal ideation: Passive SI. Able to contract for safety.  Patient has not harmed self or Others: Yes  New problem(s) identified: n/a  Discharge Plan or Barriers: CSW currently assessing for appropriate referrals.  Additional comments: Dalton Molina is a 47 y.o. male presenting to [facility] with SI/Depression/SA. "Seems hopeless", pt tried to hang self today. Pt reports feeling SI for several weeks. Pt.'s stressors: (1) job loss since November 2013;(2) homelessness; (3) loneliness;(4) financial;(5) chronic SA. Pt has tried to harm self twice in the past, both by hanging. Pt has 2 past inpt admissions with [facility] and [facility]; recent d/c from [facility]. Pt also admits problems with alcohol: consumes 5-40's, daily, last intake was 08/03/16, pt drank 5-40's. Pt says he lost his job because of his chronic alcoholism, has been drinking heavily since being unemployed in November, denies any other substance use. Pt c/o w/d sxs: chills, headache, no issues with seizures/blackouts, states does experience substance induced halluc(sporadic)/  Reason for Continuation of Hospitalization: SI Mood stabilization ETOH detox Estimated length of stay: 3-4 days For review of initial/current patient goals, please see plan of care.  Attendees:  Patient:    Family:    Physician: Geoffery Lyons MD 08/04/2013 10:24 AM   Nursing: Lupita Leash RN  08/04/2013 10:24 AM  Clinical Social Worker Aveyah Greenwood  Smart, LCSWA  08/04/2013 10:24 AM   Other: Griffin Dakin RN 08/04/2013 10:24 AM   Other: Aggie PA 08/04/2013 10:24 AM   Other: Massie Kluver, Community Care Coordinator  08/04/2013 10:24 AM   Other: Darden Dates Nurse CM 08/04/2013 10:25 AM  Scribe for Treatment Team:  [employer] LCSWA 08/04/2013 10:25 AM
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
## 2013-08-05 NOTE — [facility] Group Notes (Signed)
[facility] LCSW Group Therapy  08/05/2013 2:40 PM  Type of Therapy:  Group Therapy  Participation Level:  Minimal  Participation Quality:  Attentive  Affect:  Flat  Cognitive:  Alert  Insight:  Limited  Engagement in Therapy:  Limited  Modes of Intervention:  Discussion, Education, Exploration, Socialization and Support  Summary of Progress/Problems:  Finding Balance in Life. Today's group focused on defining balance in one's own words, identifying things that can knock one off balance, and exploring healthy ways to maintain balance in life. Group members were asked to provide an example of a time when they felt off balance, describe how they handled that situation,and process healthier ways to regain balance in the future. Group members were asked to share the most important tool for maintaining balance that they learned while at [facility] and how they plan to apply this method after discharge. Gerson stated that balance meant "being even and level headed." He was attentive but did not actively participate during today's session. Michelle Piper presented with depressed mood and flat affect. He shows limited insight AEB his inability to explore his most recent example of imbalance. Landan stated that he has trouble with anxiety that leads to a lack of focus and clarity but was unable to talk about a specific example of this. Von stated that he plans to get back in touch with friends from [place] and get more involved in the AA program after d/c in order to regain a sense of balance and control over his addiction.      Smart, Leeum Sankey 08/05/2013, 2:40 PM

## 2013-08-05 NOTE — Progress Notes (Signed)
Recreation Therapy Notes  Date: 09.18.2014 Time: 3:00pm Location: 300 Hall Dayroom  Group Topic: Communication, Team Building, Problem Solving  Goal Area(s) Addresses:  Patient will effectively work with peer towards shared goal.  Patient will identify skill used to make activity successful.  Patient will identify how skills used during activity can be used to reach post d/c goals.   Behavioral Response: Engaged, Appropriate, Attentive.  Intervention: Problem Solving Activitiy  Activity: Life Boat. Patients were given a scenario about being on a sinking yacht. Patients were informed the yacht included 15 guest, 8 of which could be placed on the life boat, along with all group members. Individuals on guest list were of varying socioeconomic classes such as a Education officer, museum, Materials engineer, Midwife, Tree surgeon.   Education: Customer service manager, Discharge Planning   Education Outcome: Acknowledges understanding  Clinical Observations/Feedback: Patient contributed to opening discussion, defining lifestyle change as it relates to sobriety for group. Patient actively engaged in group activity, voicing his opinion and debating with group members appropriately. Patient agreed with peer identified qualities that guided group decision making. Patient identified team work and communication as skills necessary to make activity a success. Additionally patient verbalized connection between communication, team work and problem solving.    Marykay Lex Lavell Ridings, LRT/CTRS  Niraj Kudrna L 08/05/2013 4:40 PM

## 2013-08-05 NOTE — [facility] Suicide Risk Assessment (Signed)
[facility] INPATIENT:  Family/Significant Other Suicide Prevention Education  Suicide Prevention Education:  Contact Attempts: Roger Felts (pt's friend) has been identified by the patient as the family member/significant other with whom the patient will be residing, and identified as the person(s) who will aid the patient in the event of a mental health crisis.  With written consent from the patient, two attempts were made to provide suicide prevention education, prior to and/or following the patient's discharge.  We were unsuccessful in providing suicide prevention education.  A suicide education pamphlet was given to the patient to share with family/significant other.  Date and time of first attempt: 08/05/13 10:45AM Date and time of second attempt: 08/05/13 3:45PM (voicemail left)  Smart, Arias Weinert 08/05/2013, 3:47 PM

## 2013-08-05 NOTE — Progress Notes (Signed)
Adult Psychoeducational Group Note  Date:  08/05/2013 Time:  1:12 PM  Group Topic/Focus:  Building Self Esteem:   The Focus of this group is helping patients become aware of the effects of self-esteem on their lives, the things they and others do that enhance or undermine their self-esteem, seeing the relationship between their level of self-esteem and the choices they make and learning ways to enhance self-esteem.  Participation Level:  Active  Participation Quality:  Appropriate and Attentive  Affect:  Appropriate  Cognitive:  Alert and Appropriate  Insight: Good  Engagement in Group:  Engaged  Modes of Intervention:  Discussion, Exploration, Socialization and Support  Additional Comments:  Pt came to group and shared that a job, lack of money, inactivity and alcohol were effecting his self-esteem. Pt plans on changing this by joining a program to help him find a job, getting out more and going to [employer]  Cathlean Cower 08/05/2013, 1:12 PM

## 2013-08-05 NOTE — ED Provider Notes (Signed)
Medical screening examination/treatment/procedure(s) were performed by non-physician practitioner and as supervising physician I was immediately available for consultation/collaboration.   Toy Baker, MD 08/05/13 [phone]

## 2013-08-05 NOTE — Progress Notes (Signed)
Recreation Therapy Notes  Date: 09.18.2014 Time: 2:30pm Location: 300 Hall Dayroom  Group Topic: Animal Assisted Activities (AAA)  Behavioral Response: Engaged, Attentive, Appropriate  Affect: Euthymic  Clinical Observations/Feedback: Dog Team: Slate & handler. Patient interacted appropriately with peer, dog team, LRT and MHT.   Dalton Molina L Dalton Molina, LRT/CTRS  Valita Righter L 08/05/2013 4:20 PM 

## 2013-08-05 NOTE — Progress Notes (Signed)
D:Pt has a blunted/depressed affect. He reports that he is feeling better and denies detox symptoms. He rates his depression as a 6 on 1-10 scale with 10 being the most depressed. Pt reports his main stressors are having no job and no money. A:Offered support, encouragement and 15 minute checks. Gave medications as ordered. R:Pt denies si and hi. Safety maintained on the unit.

## 2013-08-06 MED ORDER — FLUOXETINE HCL 20 MG PO CAPS: 20.0000 mg | ORAL_CAPSULE | Freq: Every day | ORAL | Status: DC

## 2013-08-06 MED ORDER — CETIRIZINE HCL 10 MG PO TABS: 10.0000 mg | ORAL_TABLET | Freq: Every day | ORAL | Status: DC | PRN

## 2013-08-06 MED ORDER — TRAZODONE HCL 50 MG PO TABS: 50.0000 mg | ORAL_TABLET | Freq: Every day | ORAL | Status: DC

## 2013-08-06 NOTE — Progress Notes (Signed)
[facility] Adult Case Management Discharge Plan :  Will you be returning to the same living situation after discharge: Yes,  living in truck/shelter until [facility] admission. At discharge, do you have transportation home?:Yes,  bus pass Do you have the ability to pay for your medications:Yes,  mental health  Release of information consent forms completed and in the chart;  Patient's signature needed at discharge.  Patient to Follow up at: Follow-up Information   Follow up with [facility]. (Walk in Monday through Friday between 8AM-9AM for hospital followup/medication management. )    Contact information:   [address], [place] [address] Phone: [phone] Fax: [phone]      Follow up with [facility] On 08/09/2013. (Screening at 8AM. Note: this is not admission date. Please bring ID and discharge packet. Contact person: TRACY)    Contact information:   [address] [place], [place] [address] Phone: [phone] Fax: [phone]      Patient denies SI/HI:   Yes,  during group/self report.    Safety Planning and Suicide Prevention discussed:  Yes,  contact attempts made with pt's friend. SPE completed with pt and pt provided with SPI pamphlet and encouraged to ask questions, talk about concerns, and share with support network.   Smart, Dalton Molina 08/06/2013, 10:38 AM

## 2013-08-06 NOTE — [facility] Group Notes (Signed)
[facility] LCSW Aftercare Discharge Planning Group Note   08/06/2013 9:12 AM  Participation Quality:  Appropriate   Mood/Affect:  Blunted and Depressed  Depression Rating:  7  Anxiety Rating:  7  Thoughts of Suicide:  No Will you contract for safety?   NA  Current AVH:  No  Plan for Discharge/Comments:  CSW informed pt that he has [facility] screening on Monday at [facility] and will followup at [facility] for med management. Pt stated that he felt relieved that [facility] was considering taking him back after he left a few weeks ago. Pt reports that he plans to stay in his truck or at a shelter until [facility] admission and that he will attend AA meetings and stay at the [facility] during the day in order to avoid relapse prior to [facility] admission.   Transportation Means: bus pass  Supports: none identified.   Smart, [employer]

## 2013-08-06 NOTE — [facility] Suicide Risk Assessment (Signed)
Suicide Risk Assessment  Discharge Assessment     Demographic Factors:  Male and Caucasian  Mental Status Per Nursing Assessment::   On Admission:     Current Mental Status by Physician: In full contact with reality. There are no suicidal ideas, plans or intent. His mood is euthymic, his affect is appropriate. There is not evidence of acute withdrawal. He will work on getting back to [employer].    Loss Factors: Loss of significant relationship  Historical Factors: NA  Risk Reduction Factors:   Wants to get better  Continued Clinical Symptoms:  Depression:   Comorbid alcohol abuse/dependence Alcohol/Substance Abuse/Dependencies  Cognitive Features That Contribute To Risk:  Closed-mindedness Polarized thinking Thought constriction (tunnel vision)    Suicide Risk:  Minimal: No identifiable suicidal ideation.  Patients presenting with no risk factors but with morbid ruminations; may be classified as minimal risk based on the severity of the depressive symptoms  Discharge Diagnoses:   AXIS I:  Alcohol Dependence, Anxiety Disorder NOS, Depressive Disorder NOS AXIS II:  Deferred AXIS III:   Past Medical History  Diagnosis Date  . Osteoarthritis   . Alcoholism /alcohol abuse   . GERD (gastroesophageal reflux disease)   . Scoliosis   . Depression    AXIS IV:  housing problems and other psychosocial or environmental problems AXIS V:  61-70 mild symptoms  Plan Of Care/Follow-up recommendations:  Activity:  as tolerated Diet:  regular Follow up [facility] Is patient on multiple antipsychotic therapies at discharge:  No   Has Patient had three or more failed trials of antipsychotic monotherapy by history:  No  Recommended Plan for Multiple Antipsychotic Therapies: NA  Paulena Servais A 08/06/2013, 1:02 PM

## 2013-08-06 NOTE — Discharge Summary (Signed)
Physician Discharge Summary Note  Patient:  Dalton Molina is an 47 y.o., male MRN:  [id number] DOB:  December 08, 1965 Patient phone:  [phone] (home)  Patient address:   [address] [place] [address],   Date of Admission:  08/04/2013 Date of Discharge: 08/06/13  Reason for Admission:  Alcohol detox  Discharge Diagnoses: Active Problems:   Depressive disorder   Alcohol dependence   Anxiety state, unspecified  Review of Systems  Constitutional: Negative.   HENT: Negative.   Eyes: Negative.   Respiratory: Negative.   Cardiovascular: Negative.   Gastrointestinal: Negative.   Genitourinary: Negative.   Musculoskeletal: Negative.   Skin: Negative.   Neurological: Negative.   Endo/Heme/Allergies: Negative.   Psychiatric/Behavioral: Positive for depression (Stabilized with medication prior to discharge) and substance abuse (Alcoholism). Negative for suicidal ideas, hallucinations and memory loss. The patient is nervous/anxious (Stabilized with medication prior to discharge) and has insomnia (Stabilized with medication prior to discharge).     DSM5: Schizophrenia Disorders:  NA Obsessive-Compulsive Disorders:  NA Trauma-Stressor Disorders: Anxiety disorder Substance/Addictive Disorders:  Alcohol dependence Depressive Disorders:  Major Depressive Disorder - Moderate (296.22)  Axis Diagnosis:   AXIS I:  Alcohol dependence, Anxiety disorder, Major Depressive Disorder - Moderate (296.22) AXIS II:  Deferred AXIS III:   Past Medical History  Diagnosis Date  . Osteoarthritis   . Alcoholism /alcohol abuse   . GERD (gastroesophageal reflux disease)   . Scoliosis   . Depression    AXIS IV:  other psychosocial or environmental problems and Alcoholism AXIS V:  41-50 serious symptoms  Level of Care:  RTC, OP  Hospital Course:  47 Y/O male who states he was let go of his job in November due to lateness, absences.. He admits it was because of his drinking. He has worked there 12  years. He was able to collect unemployment and it ran out. Has not been able to get a job. He is drinking, every day when he can afford it. Has been off alcohol up to a day or two. States he also has headaches. Went to [employer] three days ago, but states that his roommate kept giving him a hard time because of snoring. He staid 2 days as could not take it. Went to meetings that day, but by evening he was drinking again. He was in a relationship with a woman who was on cocaine. States she left after his money ran out. States he is dealing with loneliness, never married, no children.  After admission assessment and evaluation based on the UDS/toxicology reports that showed blood alcohol level of 162 and (+) benzodiazepine, it was determined that Dalton Molina will need detoxification treatment to stabilize his systems of alcohol intoxication and to combat the withdrawal symptoms as well. And his discharge plans included a referral to a long term treatment facility [facility]) for a more intense substance abuse treatment. Dalton Molina was then started on Librium detoxification treatment protocol for his alcohol detoxification. He was also enrolled in group counseling sessions and activities to learn coping skills that should help him after discharge to cope better, manage his substance abuse problems to maintain a much longer sobriety.   Besides the detoxification treatment protocol, patient also was ordered and received Fluoxetine 20 mg Q daily for depression and Trazodone 50 mg Q bedtime for sleep. He was also enrolled and attended AA/NA meetings being offered and held on this unit. He has some previous and or identifiable medical conditions that required treatment and or monitoring. He  received medication management for all those health issues. He was monitored closely for any potential problems that may arise as a result of result of and or during detoxification treatment. Patient tolerated his  treatment regimen and detoxification treatment without any significant adverse effects and or reactions reported.  Patient attended treatment team meeting this am and met with the treatment team members. His reason for admission, present symptoms, substance abuse issues, response to treatment and discharge plans discussed. Patient endorsed that he is doing well and stable for discharge to pursue the next phase of his substance abuse treatment. It was agreed upon that he will continue substance abuse treatment at the [facility] in [place], [place] on 08/09/13. He is instructed to report at the lobby on 08:00 am for admission assessment/screening. The address, date, time and contact information provided for patient in writing.  In addition to residential substance abuse treatment, Dalton Molina is encouraged to join/attend AA/NA meetings being offered and held within his community for a much needed support from peers.  Upon discharge, patient adamantly denies suicidal, homicidal ideations, auditory, visual hallucinations, delusional thinking and or withdrawal symptoms. Patient left [facility] with all personal belongings in no apparent distress. He received 2 weeks worth samples of his discharge medications. Transportation per bus, and bus fare/voucher provided by [facility].   Consults:  psychiatry  Significant Diagnostic Studies:  labs: CBC with diff, CMP, UDS, Toxicology tests, U/A  Discharge Vitals:   Blood pressure 103/68, pulse 93, temperature 98.4 F (36.9 C), temperature source Oral, resp. rate 20, height 6\' 2"  (1.88 m), weight 105.235 kg (232 lb), SpO2 93.00%. Body mass index is 29.77 kg/(m^2). Lab Results:   Results for orders placed during the hospital encounter of 08/03/13 (from the past 72 hour(s))  CBC     Status: Abnormal   Collection Time    08/03/13 10:38 PM      Result Value Range   WBC 10.6 (*) 4.0 - 10.5 K/uL   RBC 5.16  4.22 - 5.81 MIL/uL   Hemoglobin 18.1 (*) 13.0 - 17.0 g/dL    HCT 16.1  09.6 - 04.5 %   MCV 95.5  78.0 - 100.0 fL   MCH 35.1 (*) 26.0 - 34.0 pg   MCHC 36.7 (*) 30.0 - 36.0 g/dL   Comment: CORRECTED FOR INTERFERING SUBSTANCE   RDW 12.6  11.5 - 15.5 %   Platelets 295  150 - 400 K/uL  COMPREHENSIVE METABOLIC PANEL     Status: Abnormal   Collection Time    08/03/13 10:38 PM      Result Value Range   Sodium 135  135 - 145 mEq/L   Potassium 3.7  3.5 - 5.1 mEq/L   Chloride 98  96 - 112 mEq/L   CO2 22  19 - 32 mEq/L   Glucose, Bld 109 (*) 70 - 99 mg/dL   BUN 6  6 - 23 mg/dL   Creatinine, Ser 4.09  0.50 - 1.35 mg/dL   Calcium 9.1  8.4 - 81.1 mg/dL   Total Protein 7.2  6.0 - 8.3 g/dL   Albumin 3.8  3.5 - 5.2 g/dL   AST 66 (*) 0 - 37 U/L   ALT 64 (*) 0 - 53 U/L   Alkaline Phosphatase 65  39 - 117 U/L   Total Bilirubin 0.4  0.3 - 1.2 mg/dL   GFR calc non Af Amer >90  >90 mL/min   GFR calc Af Amer >90  >90 mL/min   Comment: (  NOTE)     The eGFR has been calculated using the CKD EPI equation.     This calculation has not been validated in all clinical situations.     eGFR's persistently <90 mL/min signify possible Chronic Kidney     Disease.  ETHANOL     Status: Abnormal   Collection Time    08/03/13 10:38 PM      Result Value Range   Alcohol, Ethyl (B) 162 (*) 0 - 11 mg/dL   Comment:            LOWEST DETECTABLE LIMIT FOR     SERUM ALCOHOL IS 11 mg/dL     FOR MEDICAL PURPOSES ONLY  ACETAMINOPHEN LEVEL     Status: None   Collection Time    08/03/13 10:38 PM      Result Value Range   Acetaminophen (Tylenol), Serum <15.0  10 - 30 ug/mL   Comment:            THERAPEUTIC CONCENTRATIONS VARY     SIGNIFICANTLY. A RANGE OF 10-30     ug/mL MAY BE AN EFFECTIVE     CONCENTRATION FOR MANY PATIENTS.     HOWEVER, SOME ARE BEST TREATED     AT CONCENTRATIONS OUTSIDE THIS     RANGE.     ACETAMINOPHEN CONCENTRATIONS     >150 ug/mL AT 4 HOURS AFTER     INGESTION AND >50 ug/mL AT 12     HOURS AFTER INGESTION ARE     OFTEN ASSOCIATED WITH TOXIC      REACTIONS.  SALICYLATE LEVEL     Status: Abnormal   Collection Time    08/03/13 10:38 PM      Result Value Range   Salicylate Lvl <2.0 (*) 2.8 - 20.0 mg/dL  URINE RAPID DRUG SCREEN (HOSP PERFORMED)     Status: Abnormal   Collection Time    08/04/13  3:48 AM      Result Value Range   Opiates NONE DETECTED  NONE DETECTED   Cocaine NONE DETECTED  NONE DETECTED   Benzodiazepines POSITIVE (*) NONE DETECTED   Amphetamines NONE DETECTED  NONE DETECTED   Tetrahydrocannabinol NONE DETECTED  NONE DETECTED   Barbiturates NONE DETECTED  NONE DETECTED   Comment:            DRUG SCREEN FOR MEDICAL PURPOSES     ONLY.  IF CONFIRMATION IS NEEDED     FOR ANY PURPOSE, NOTIFY LAB     WITHIN 5 DAYS.                LOWEST DETECTABLE LIMITS     FOR URINE DRUG SCREEN     Drug Class       Cutoff (ng/mL)     Amphetamine      1000     Barbiturate      200     Benzodiazepine   200     Tricyclics       300     Opiates          300     Cocaine          300     THC              50    Physical Findings: AIMS:  , ,  ,  ,    CIWA:  CIWA-Ar Total: 1 COWS:     Psychiatric Specialty Exam: See Psychiatric Specialty Exam and Suicide Risk Assessment completed by  Attending Physician prior to discharge.  Discharge destination: Home, then to  [facility]  Is patient on multiple antipsychotic therapies at discharge:  No   Has Patient had three or more failed trials of antipsychotic monotherapy by history:  No  Recommended Plan for Multiple Antipsychotic Therapies: NA     Medication List       Indication   cetirizine 10 MG tablet  Commonly known as:  ZYRTEC  Take 1 tablet (10 mg total) by mouth daily as needed for allergies.   Indication:  Perennial Rhinitis, Hayfever     FLUoxetine 20 MG capsule  Commonly known as:  PROZAC  Take 1 capsule (20 mg total) by mouth daily. For depression   Indication:  Depression     traZODone 50 MG tablet  Commonly known as:  DESYREL  Take 1 tablet (50 mg  total) by mouth at bedtime. For sleep   Indication:  Trouble Sleeping       Follow-up Information   Follow up with [facility]. (Walk in Monday through Friday between 8AM-9AM for hospital followup/medication management. )    Contact information:   [address], [place] [address] Phone: [phone] Fax: [phone]      Follow up with [facility] On 08/09/2013. (Screening at 8AM. Note: this is not admission date. Please bring ID and discharge packet. Contact person: TRACY)    Contact information:   [address] [place], [place] [address] Phone: [phone] Fax: [phone]     Follow-up recommendations: Activity:  As tolerated Diet: As recommended by your primary care doctor. Keep all scheduled follow-up appointments as recommended.   Continue to work your relapse prevention plan Comments: Take all your medications as prescribed by your mental healthcare provider. Report any adverse effects and or reactions from your medicines to your outpatient provider promptly. Patient is instructed and cautioned to not engage in alcohol and or illegal drug use while on prescription medicines. In the event of worsening symptoms, patient is instructed to call the crisis hotline, 911 and or go to the nearest ED for appropriate evaluation and treatment of symptoms. Follow-up with your primary care provider for your other medical issues, concerns and or health care needs.   Total Discharge Time:  Greater than 30 minutes.  Signed: Sanjuana Kava, PMHNP-BC 08/06/2013, 9:19 AM Agree with assessment and plan Reymundo Poll. Dub Mikes, M.D.

## 2013-08-06 NOTE — Progress Notes (Signed)
Pt observed resting in bed with eyes closed. RR WNL, even and unlabored. Level III obs in place for safety and pt is safe. Thoams Siefert Eakes  

## 2013-08-06 NOTE — [facility] Group Notes (Signed)
[facility] LCSW Group Therapy  08/06/2013 3:28 PM  Type of Therapy:  Group Therapy  Participation Level:  Active  Participation Quality:  Attentive  Affect:  Appropriate  Cognitive:  Alert  Insight:  Improving  Engagement in Therapy:  Engaged  Modes of Intervention:  Confrontation, Discussion, Education, Socialization and Support  Summary of Progress/Problems: Feelings around Relapse. Group members discussed the meaning of relapse and shared personal stories of relapse, how it affected them and others, and how they perceived themselves during this time. Group members were encouraged to identify triggers, warning signs and coping skills used when facing the possibility of relapse. Dalton Molina was attentive and engaged throughout today's group. Dalton Molina shows progress in the group setting AEB his ability to actively engage in group discussion without prompting. Dalton Molina needed occasional redirection by CSW/psych intern in order to stay on topic but was easily redirectable. Dalton Molina stated that a warning sign for possible relapse is "feeling overwhelmed with emotions. I just stop caring and pick up a bottle of liquor." Dalton Molina also identified "having too much money in my pocket" as a potential trigger for him. Dalton Molina shows improving insight AEB his ability to identify "getting involved with AA, shutting my mouth, stop making excuses, and listen to those in AA that have been sober for years and follow in their footsteps" as ways for him to avoid relapses in the future an break the cycle of relapse.   Dalton Molina, Dalton Molina 08/06/2013, 3:28 PM

## 2013-08-06 NOTE — Progress Notes (Signed)
Discharge Note: Discharge instructions/prescriptions/medication samples given to patient. Patient verbalized understanding of discharge instructions and prescriptions. Returned belongings to patient. Denies SI/HI/AVH. Patient d/c without incident to the front lobby and transported by [facility] bus. 

## 2013-08-06 NOTE — Progress Notes (Signed)
Adult Psychoeducational Group Note  Date:  08/06/2013 Time:  12:35 PM  Group Topic/Focus:  Relapse Prevention Planning:   The focus of this group is to define relapse and discuss the need for planning to combat relapse.  Participation Level:  Active  Participation Quality:  Appropriate and Sharing  Affect:  Appropriate  Cognitive:  Appropriate  Insight: Appropriate  Engagement in Group:  Engaged  Modes of Intervention:  Discussion    Dalton Molina 08/06/2013, 12:35 PM

## 2013-08-06 NOTE — Progress Notes (Signed)
Patient did attend the evening karaoke group. Pt was engaged, supportive, and participated by singing two songs.   

## 2013-08-11 NOTE — Progress Notes (Signed)
Patient Discharge Instructions:  After Visit Summary (AVS):   Faxed to:  08/11/13 Discharge Summary Note:   Faxed to:  08/11/13 Psychiatric Admission Assessment Note:   Faxed to:  08/11/13 Suicide Risk Assessment - Discharge Assessment:   Faxed to:  08/11/13 Faxed/Sent to the Next Level Care provider:  08/11/13 Faxed to [facility] @ [phone] Faxed to [facility] @ [phone]  Jerelene Redden, 08/11/2013, 2:48 PM

## 2013-08-15 ENCOUNTER — Emergency Department (HOSPITAL_COMMUNITY)
Admission: EM | Admit: 2013-08-15 | Discharge: 2013-08-16 | Disposition: A | Discharge status: Home / Self Care | Payer: Self-pay | Attending: Emergency Medicine | Admitting: Emergency Medicine

## 2013-08-15 ENCOUNTER — Encounter (HOSPITAL_COMMUNITY): Payer: Self-pay | Admitting: *Deleted

## 2013-08-15 DIAGNOSIS — F1020 Alcohol dependence, uncomplicated: Secondary | ICD-10-CM | POA: Insufficient documentation

## 2013-08-15 DIAGNOSIS — F3289 Other specified depressive episodes: Secondary | ICD-10-CM | POA: Insufficient documentation

## 2013-08-15 DIAGNOSIS — F17200 Nicotine dependence, unspecified, uncomplicated: Secondary | ICD-10-CM | POA: Insufficient documentation

## 2013-08-15 DIAGNOSIS — Z79899 Other long term (current) drug therapy: Secondary | ICD-10-CM | POA: Insufficient documentation

## 2013-08-15 DIAGNOSIS — R45851 Suicidal ideations: Secondary | ICD-10-CM | POA: Insufficient documentation

## 2013-08-15 DIAGNOSIS — M1990 Unspecified osteoarthritis, unspecified site: Secondary | ICD-10-CM | POA: Insufficient documentation

## 2013-08-15 DIAGNOSIS — F329 Major depressive disorder, single episode, unspecified: Secondary | ICD-10-CM | POA: Insufficient documentation

## 2013-08-15 DIAGNOSIS — K219 Gastro-esophageal reflux disease without esophagitis: Secondary | ICD-10-CM | POA: Insufficient documentation

## 2013-08-15 DIAGNOSIS — M4120 Other idiopathic scoliosis, site unspecified: Secondary | ICD-10-CM | POA: Insufficient documentation

## 2013-08-15 LAB — CBC WITH DIFFERENTIAL/PLATELET
Basophils Relative: 0 % (ref 0–1)
Eosinophils Absolute: 0.6 10*3/uL (ref 0.0–0.7)
Eosinophils Relative: 6 % — ABNORMAL HIGH (ref 0–5)
Hemoglobin: 16.8 g/dL (ref 13.0–17.0)
Lymphs Abs: 3.7 10*3/uL (ref 0.7–4.0)
MCH: 35.7 pg — ABNORMAL HIGH (ref 26.0–34.0)
MCHC: 36.8 g/dL — ABNORMAL HIGH (ref 30.0–36.0)
Monocytes Absolute: 0.6 10*3/uL (ref 0.1–1.0)
Neutrophils Relative %: 52 % (ref 43–77)

## 2013-08-15 NOTE — ED Notes (Signed)
Pt not willing to talk to nurse

## 2013-08-15 NOTE — ED Notes (Signed)
Pt received from triage on stretcher,  Passed out,  Pt is laying on right side, for airway safety after vomiting in triage per Staff

## 2013-08-15 NOTE — ED Notes (Signed)
Pt has jeans, pink flip flops, black underwear, black houndstooth shirt, glasses, 2 packs cigarettes, change,ligher brown belt.

## 2013-08-15 NOTE — ED Notes (Signed)
PT brought to ER via EMS; pt states that he has drank a case of beer today and that he wants to kill himself; plan is drink himself to death

## 2013-08-16 DIAGNOSIS — F1020 Alcohol dependence, uncomplicated: Secondary | ICD-10-CM

## 2013-08-16 LAB — BASIC METABOLIC PANEL WITH GFR
BUN: 9 mg/dL (ref 6–23)
CO2: 22 meq/L (ref 19–32)
Calcium: 8.8 mg/dL (ref 8.4–10.5)
Chloride: 99 meq/L (ref 96–112)
Creatinine, Ser: 1.07 mg/dL (ref 0.50–1.35)
GFR calc Af Amer: >90 mL/min
GFR calc non Af Amer: 81 mL/min — ABNORMAL LOW
Glucose, Bld: 94 mg/dL (ref 70–99)
Potassium: 3.6 meq/L (ref 3.5–5.1)
Sodium: 136 meq/L (ref 135–145)

## 2013-08-16 LAB — RAPID URINE DRUG SCREEN, HOSP PERFORMED
Amphetamines: NOT DETECTED
Barbiturates: NOT DETECTED
Benzodiazepines: POSITIVE — AB
Cocaine: NOT DETECTED
Opiates: NOT DETECTED
Tetrahydrocannabinol: NOT DETECTED

## 2013-08-16 MED ORDER — VITAMIN B-1 100 MG PO TABS
100.0000 mg | ORAL_TABLET | Freq: Every day | ORAL | Status: DC
  Administered 2013-08-16: 100 mg via ORAL
  Filled 2013-08-16: qty 1

## 2013-08-16 MED ORDER — TRAZODONE HCL 50 MG PO TABS: 50.0000 mg | ORAL_TABLET | Freq: Every evening | ORAL | Status: DC | PRN

## 2013-08-16 MED ORDER — LORATADINE 10 MG PO TABS: 10.0000 mg | ORAL_TABLET | Freq: Every day | ORAL | Status: DC

## 2013-08-16 MED ORDER — LORAZEPAM 1 MG PO TABS: 0.0000 mg | ORAL_TABLET | Freq: Four times a day (QID) | ORAL | Status: DC

## 2013-08-16 MED ORDER — TRAZODONE HCL 50 MG PO TABS: 50.0000 mg | ORAL_TABLET | Freq: Every day | ORAL | Status: DC

## 2013-08-16 MED ORDER — LORATADINE 10 MG PO TABS
10.0000 mg | ORAL_TABLET | Freq: Every day | ORAL | Status: DC
  Administered 2013-08-16: 10 mg via ORAL
  Filled 2013-08-16: qty 1

## 2013-08-16 MED ORDER — ONDANSETRON HCL 4 MG PO TABS: 4.0000 mg | ORAL_TABLET | Freq: Three times a day (TID) | ORAL | Status: DC | PRN

## 2013-08-16 MED ORDER — IBUPROFEN 200 MG PO TABS: 600.0000 mg | ORAL_TABLET | Freq: Three times a day (TID) | ORAL | Status: DC | PRN

## 2013-08-16 MED ORDER — ALUM & MAG HYDROXIDE-SIMETH 200-200-20 MG/5ML PO SUSP: 30.0000 mL | ORAL | Status: DC | PRN

## 2013-08-16 MED ORDER — LORAZEPAM 1 MG PO TABS: 0.0000 mg | ORAL_TABLET | Freq: Two times a day (BID) | ORAL | Status: DC

## 2013-08-16 MED ORDER — THIAMINE HCL 100 MG/ML IJ SOLN: 100.0000 mg | Freq: Every day | INTRAMUSCULAR | Status: DC

## 2013-08-16 MED ORDER — FLUOXETINE HCL 20 MG PO CAPS
20.0000 mg | ORAL_CAPSULE | Freq: Every day | ORAL | Status: DC
  Administered 2013-08-16: 20 mg via ORAL
  Filled 2013-08-16: qty 1

## 2013-08-16 NOTE — Progress Notes (Signed)
CSW met with pt to discuss discharge plans. CSW provided bus pass, list of homelessness resources, and outpatient SA resources. CSW specifically advised pt to visit [facility] and make appointment with [facility]. CSW provided supportive counseling as pt discussed his options. Pt thanked CSW for resources. CSW thanked pt for his time.  RN and EDP aware.  CSW to sign off.  York Spaniel K[place], [phone]     ED CSW  10:33 am

## 2013-08-16 NOTE — ED Provider Notes (Signed)
CSN: [id number]     Arrival date & time 08/15/13  2111 History   First MD Initiated Contact with Patient 08/15/13 2122     Chief Complaint  Patient presents with  . Medical Clearance   (Consider location/radiation/quality/duration/timing/severity/associated sxs/prior Treatment) HPI Comments: The patient presents for medical clearance.  He has a long history of alcohol abuse and states that he has drank a case of beer today.  He states that he has tried in the past to quit (was most recently seen here on the 18th) and has no luck.  He states that he is now suicidal with a plan to drink himself to death.  He denies homicidal ideation.  The history is provided by the patient. No language interpreter was used.    Past Medical History  Diagnosis Date  . Osteoarthritis   . Alcoholism /alcohol abuse   . GERD (gastroesophageal reflux disease)   . Scoliosis   . Depression    Past Surgical History  Procedure Laterality Date  . Appendectomy     Family History  Problem Relation Age of Onset  . Cancer Mother     bladder  . Cancer Father     mesothelioma   History  Substance Use Topics  . Smoking status: Current Every Day Smoker -- 2.00 packs/day for 20 years    Types: Cigarettes  . Smokeless tobacco: Not on file  . Alcohol Use: 15.0 oz/week    30 drink(s) per week     Comment: reports is an alcoholic    Review of Systems  Unable to perform ROS: Psychiatric disorder    Allergies  Percocet  Home Medications   Current Outpatient Rx  Name  Route  Sig  Dispense  Refill  . cetirizine (ZYRTEC) 10 MG tablet   Oral   Take 1 tablet (10 mg total) by mouth daily as needed for allergies.         Marland Kitchen FLUoxetine (PROZAC) 20 MG capsule   Oral   Take 1 capsule (20 mg total) by mouth daily. For depression   30 capsule   0   . traZODone (DESYREL) 50 MG tablet   Oral   Take 1 tablet (50 mg total) by mouth at bedtime. For sleep   30 tablet   0    BP 126/80  Pulse 106  Temp(Src)  97.5 F (36.4 C) (Oral)  Resp 16  SpO2 94% Physical Exam  Nursing note and vitals reviewed. Constitutional: He appears well-developed and well-nourished. No distress.  HENT:  Head: Normocephalic and atraumatic.  Nose: Nose normal.  Mouth/Throat: Oropharynx is clear and moist.  Eyes: Conjunctivae are normal. Pupils are equal, round, and reactive to light. No scleral icterus.  Neck: Normal range of motion. Neck supple.  Cardiovascular: Normal rate, regular rhythm and normal heart sounds.  Exam reveals no gallop and no friction rub.   No murmur heard. Pulmonary/Chest: Effort normal and breath sounds normal. No respiratory distress. He has no wheezes. He has no rales. He exhibits no tenderness.  Abdominal: Soft. Bowel sounds are normal. He exhibits no distension. There is no tenderness.  Musculoskeletal: Normal range of motion. He exhibits no edema and no tenderness.  Neurological: He exhibits normal muscle tone. Coordination normal.  Skin: Skin is warm and dry. No rash noted. No erythema. No pallor.  Psychiatric:  Suicidal ideation.    ED Course  Procedures (including critical care time) Labs Review Labs Reviewed  CBC WITH DIFFERENTIAL - Abnormal; Notable for the following:  MCH 35.7 (*)    MCHC 36.8 (*)    Eosinophils Relative 6 (*)    All other components within normal limits  ETHANOL - Abnormal; Notable for the following:    Alcohol, Ethyl (B) 192 (*)    All other components within normal limits  URINE RAPID DRUG SCREEN (HOSP PERFORMED) - Abnormal; Notable for the following:    Benzodiazepines POSITIVE (*)    All other components within normal limits  BASIC METABOLIC PANEL - Abnormal; Notable for the following:    GFR calc non Af Amer 81 (*)    All other components within normal limits   Imaging Review No results found.  MDM  Alcohol Abuse  The patient is medically clear, though I doubt the sincerity of his threat, we will have him evaluated by telepsych.      Izola Price Marisue Humble, PA-C 08/16/13 0301

## 2013-08-16 NOTE — BH Assessment (Addendum)
Spoke with Ardelia Mems, RN who said Pt is too drowsy and intoxicated to be assessed at this time. Assessment will be completed when Pt is able to participate in assessment.  Harlin Rain Ria Comment, [facility] Triage Specialist

## 2013-08-16 NOTE — ED Notes (Signed)
Patient presents lethargic and still intoxicated. Denies any current suicidal or homicidal ideation. Denies any auditory or visual hallucinations; denies any pain.
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
## 2013-08-18 NOTE — ED Provider Notes (Signed)
Medical screening examination/treatment/procedure(s) were performed by non-physician practitioner and as supervising physician I was immediately available for consultation/collaboration.   Gwyneth Sprout, MD 08/18/13 2033
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
## 2015-09-08 ENCOUNTER — Emergency Department (HOSPITAL_COMMUNITY): Payer: Self-pay

## 2015-09-08 ENCOUNTER — Encounter (HOSPITAL_COMMUNITY): Payer: Self-pay | Admitting: Emergency Medicine

## 2015-09-08 ENCOUNTER — Emergency Department (HOSPITAL_COMMUNITY)
Admission: EM | Admit: 2015-09-08 | Discharge: 2015-09-09 | Disposition: A | Discharge status: Home / Self Care | Payer: Self-pay | Attending: Emergency Medicine | Admitting: Emergency Medicine

## 2015-09-08 DIAGNOSIS — R509 Fever, unspecified: Secondary | ICD-10-CM | POA: Insufficient documentation

## 2015-09-08 DIAGNOSIS — R059 Cough, unspecified: Secondary | ICD-10-CM

## 2015-09-08 DIAGNOSIS — F329 Major depressive disorder, single episode, unspecified: Secondary | ICD-10-CM | POA: Insufficient documentation

## 2015-09-08 DIAGNOSIS — Z8739 Personal history of other diseases of the musculoskeletal system and connective tissue: Secondary | ICD-10-CM | POA: Insufficient documentation

## 2015-09-08 DIAGNOSIS — Z720 Tobacco use: Secondary | ICD-10-CM | POA: Insufficient documentation

## 2015-09-08 DIAGNOSIS — Z79899 Other long term (current) drug therapy: Secondary | ICD-10-CM | POA: Insufficient documentation

## 2015-09-08 DIAGNOSIS — R05 Cough: Secondary | ICD-10-CM | POA: Insufficient documentation

## 2015-09-08 DIAGNOSIS — J45901 Unspecified asthma with (acute) exacerbation: Secondary | ICD-10-CM | POA: Insufficient documentation

## 2015-09-08 DIAGNOSIS — Z8719 Personal history of other diseases of the digestive system: Secondary | ICD-10-CM | POA: Insufficient documentation

## 2015-09-08 LAB — BASIC METABOLIC PANEL
Anion gap: 8 (ref 5–15)
BUN: 13 mg/dL (ref 6–20)
CO2: 27 mmol/L (ref 22–32)
CREATININE: 1.24 mg/dL (ref 0.61–1.24)
Calcium: 9 mg/dL (ref 8.9–10.3)
Chloride: 99 mmol/L — ABNORMAL LOW (ref 101–111)
GFR calc Af Amer: >60 mL/min (ref >60)
Glucose, Bld: 114 mg/dL — ABNORMAL HIGH (ref 65–99)
Potassium: 3.9 mmol/L (ref 3.5–5.1)
SODIUM: 134 mmol/L — AB (ref 135–145)

## 2015-09-08 LAB — CBC WITH DIFFERENTIAL/PLATELET
Basophils Absolute: 0 10*3/uL (ref 0.0–0.1)
Basophils Relative: 0 %
EOS ABS: 0.7 10*3/uL (ref 0.0–0.7)
EOS PCT: 6 %
HCT: 44.9 % (ref 39.0–52.0)
Hemoglobin: 16 g/dL (ref 13.0–17.0)
LYMPHS ABS: 3.3 10*3/uL (ref 0.7–4.0)
Lymphocytes Relative: 29 %
MCH: 33.1 pg (ref 26.0–34.0)
MCHC: 35.6 g/dL (ref 30.0–36.0)
MCV: 92.8 fL (ref 78.0–100.0)
MONO ABS: 0.9 10*3/uL (ref 0.1–1.0)
MONOS PCT: 8 %
Neutro Abs: 6.4 10*3/uL (ref 1.7–7.7)
Neutrophils Relative %: 57 %
PLATELETS: 259 10*3/uL (ref 150–400)
RBC: 4.84 MIL/uL (ref 4.22–5.81)
RDW: 13.3 % (ref 11.5–15.5)
WBC: 11.3 10*3/uL — AB (ref 4.0–10.5)

## 2015-09-08 LAB — URINALYSIS, ROUTINE W REFLEX MICROSCOPIC
Bilirubin Urine: NEGATIVE
GLUCOSE, UA: NEGATIVE mg/dL
HGB URINE DIPSTICK: NEGATIVE
KETONES UR: NEGATIVE mg/dL
Leukocytes, UA: NEGATIVE
Nitrite: NEGATIVE
PH: 5 (ref 5.0–8.0)
PROTEIN: NEGATIVE mg/dL
Specific Gravity, Urine: 1.012 (ref 1.005–1.030)
Urobilinogen, UA: 0.2 mg/dL (ref 0.0–1.0)

## 2015-09-08 MED ORDER — ALBUTEROL (5 MG/ML) CONTINUOUS INHALATION SOLN
10.0000 mg/h | INHALATION_SOLUTION | RESPIRATORY_TRACT | Status: AC
  Administered 2015-09-08: 10 mg/h via RESPIRATORY_TRACT
  Filled 2015-09-08: qty 20

## 2015-09-08 MED ORDER — ALBUTEROL SULFATE (2.5 MG/3ML) 0.083% IN NEBU
INHALATION_SOLUTION | RESPIRATORY_TRACT | Status: AC
  Filled 2015-09-08: qty 6

## 2015-09-08 MED ORDER — IPRATROPIUM BROMIDE 0.02 % IN SOLN
0.5000 mg | Freq: Once | RESPIRATORY_TRACT | Status: AC
  Administered 2015-09-08: 0.5 mg via RESPIRATORY_TRACT
  Filled 2015-09-08: qty 2.5

## 2015-09-08 MED ORDER — ALBUTEROL SULFATE (2.5 MG/3ML) 0.083% IN NEBU
5.0000 mg | INHALATION_SOLUTION | Freq: Once | RESPIRATORY_TRACT | Status: AC
  Administered 2015-09-08: 5 mg via RESPIRATORY_TRACT

## 2015-09-08 MED ORDER — METHYLPREDNISOLONE SODIUM SUCC 125 MG IJ SOLR
125.0000 mg | Freq: Once | INTRAMUSCULAR | Status: AC
  Administered 2015-09-08: 125 mg via INTRAVENOUS
  Filled 2015-09-08: qty 2

## 2015-09-08 MED ORDER — ALBUTEROL SULFATE (2.5 MG/3ML) 0.083% IN NEBU
5.0000 mg | INHALATION_SOLUTION | Freq: Once | RESPIRATORY_TRACT | Status: AC
  Administered 2015-09-08: 5 mg via RESPIRATORY_TRACT
  Filled 2015-09-08: qty 6

## 2015-09-08 NOTE — ED Notes (Signed)
Pt. reports fever , productive cough , SOB and chest congestion onset this week .

## 2015-09-08 NOTE — ED Provider Notes (Signed)
CSN: [id number]     Arrival date & time 09/08/15  2021 History   First MD Initiated Contact with Patient 09/08/15 2227     Chief Complaint  Patient presents with  . Cough  . Shortness of Breath  . Fever     (Consider location/radiation/quality/duration/timing/severity/associated sxs/prior Treatment) HPI Comments: Patient presents to the emergency department with chief complaint of fever, productive cough, and shortness of breath. Patient states that his symptoms started earlier this week. He states that he is a heavy smoker. He has not tried taking anything to alleviate his symptoms. He has not measured his temperature. He states that he has had worsening shortness of breath with activity. He reports associated wheezing. He denies any history of COPD, but states that he did have childhood asthma.  The history is provided by the patient. No language interpreter was used.    Past Medical History  Diagnosis Date  . Osteoarthritis   . Alcoholism /alcohol abuse (HCC)   . GERD (gastroesophageal reflux disease)   . Scoliosis   . Depression    Past Surgical History  Procedure Laterality Date  . Appendectomy     Family History  Problem Relation Age of Onset  . Cancer Mother     bladder  . Cancer Father     mesothelioma   Social History  Substance Use Topics  . Smoking status: Current Every Day Smoker -- 0.00 packs/day for 0 years    Types: Cigarettes  . Smokeless tobacco: None  . Alcohol Use: Yes     Comment: reports is an alcoholic    Review of Systems  Constitutional: Negative for fever and chills.  Respiratory: Positive for cough, shortness of breath and wheezing.   Cardiovascular: Negative for chest pain.  Gastrointestinal: Negative for nausea, vomiting, diarrhea and constipation.  Genitourinary: Negative for dysuria.  All other systems reviewed and are negative.     Allergies  Percocet  Home Medications   Prior to Admission medications   Medication Sig Start  Date End Date Taking? Authorizing Provider  divalproex (DEPAKOTE) 500 MG DR tablet Take 500 mg by mouth 2 (two) times daily.   Yes Historical Provider, MD  FLUoxetine (PROZAC) 20 MG capsule Take 1 capsule (20 mg total) by mouth daily. For depression 08/06/13  Yes Sanjuana KavaAgnes I Nwoko, NP  hydrOXYzine (VISTARIL) 50 MG capsule Take 50 mg by mouth daily.   Yes Historical Provider, MD  risperiDONE (RISPERDAL) 0.5 MG tablet Take 0.5 mg by mouth daily.   Yes Historical Provider, MD  cetirizine (ZYRTEC) 10 MG tablet Take 1 tablet (10 mg total) by mouth daily as needed for allergies. Patient not taking: Reported on 09/08/2015 08/06/13   Sanjuana KavaAgnes I Nwoko, NP  traZODone (DESYREL) 50 MG tablet Take 1 tablet (50 mg total) by mouth at bedtime. For sleep Patient not taking: Reported on 09/08/2015 08/06/13   Sanjuana KavaAgnes I Nwoko, NP   BP 121/83 mmHg  Pulse 95  Temp(Src) 98.4 F (36.9 C) (Oral)  Resp 16  Ht 5\' 9"  (1.753 m)  Wt 239 lb (108.41 kg)  BMI 35.28 kg/m2  SpO2 99% Physical Exam  Constitutional: He is oriented to person, place, and time. He appears well-developed and well-nourished.  HENT:  Head: Normocephalic and atraumatic.  Eyes: Conjunctivae and EOM are normal. Pupils are equal, round, and reactive to light. Right eye exhibits no discharge. Left eye exhibits no discharge. No scleral icterus.  Neck: Normal range of motion. Neck supple. No JVD present.  Cardiovascular: Normal rate,  regular rhythm and normal heart sounds.  Exam reveals no gallop and no friction rub.   No murmur heard. Pulmonary/Chest: Effort normal. No respiratory distress. He has wheezes. He has no rales. He exhibits no tenderness.  Bilateral wheezes and rales  Abdominal: Soft. He exhibits no distension and no mass. There is no tenderness. There is no rebound and no guarding.  Musculoskeletal: Normal range of motion. He exhibits no edema or tenderness.  Neurological: He is alert and oriented to person, place, and time.  Skin: Skin is warm and  dry.  Psychiatric: He has a normal mood and affect. His behavior is normal. Judgment and thought content normal.  Nursing note and vitals reviewed.   ED Course  Procedures (including critical care time) Results for orders placed or performed during the hospital encounter of 09/08/15  CBC with Differential  Result Value Ref Range   WBC 11.3 (H) 4.0 - 10.5 K/uL   RBC 4.84 4.22 - 5.81 MIL/uL   Hemoglobin 16.0 13.0 - 17.0 g/dL   HCT 16.1 09.6 - 04.5 %   MCV 92.8 78.0 - 100.0 fL   MCH 33.1 26.0 - 34.0 pg   MCHC 35.6 30.0 - 36.0 g/dL   RDW 40.9 81.1 - 91.4 %   Platelets 259 150 - 400 K/uL   Neutrophils Relative % 57 %   Neutro Abs 6.4 1.7 - 7.7 K/uL   Lymphocytes Relative 29 %   Lymphs Abs 3.3 0.7 - 4.0 K/uL   Monocytes Relative 8 %   Monocytes Absolute 0.9 0.1 - 1.0 K/uL   Eosinophils Relative 6 %   Eosinophils Absolute 0.7 0.0 - 0.7 K/uL   Basophils Relative 0 %   Basophils Absolute 0.0 0.0 - 0.1 K/uL  Basic metabolic panel  Result Value Ref Range   Sodium 134 (L) 135 - 145 mmol/L   Potassium 3.9 3.5 - 5.1 mmol/L   Chloride 99 (L) 101 - 111 mmol/L   CO2 27 22 - 32 mmol/L   Glucose, Bld 114 (H) 65 - 99 mg/dL   BUN 13 6 - 20 mg/dL   Creatinine, Ser 7.82 0.61 - 1.24 mg/dL   Calcium 9.0 8.9 - 95.6 mg/dL   GFR calc non Af Amer >60 >60 mL/min   GFR calc Af Amer >60 >60 mL/min   Anion gap 8 5 - 15  Urinalysis, Routine w reflex microscopic (not at [facility])  Result Value Ref Range   Color, Urine YELLOW YELLOW   APPearance CLEAR CLEAR   Specific Gravity, Urine 1.012 1.005 - 1.030   pH 5.0 5.0 - 8.0   Glucose, UA NEGATIVE NEGATIVE mg/dL   Hgb urine dipstick NEGATIVE NEGATIVE   Bilirubin Urine NEGATIVE NEGATIVE   Ketones, ur NEGATIVE NEGATIVE mg/dL   Protein, ur NEGATIVE NEGATIVE mg/dL   Urobilinogen, UA 0.2 0.0 - 1.0 mg/dL   Nitrite NEGATIVE NEGATIVE   Leukocytes, UA NEGATIVE NEGATIVE   Dg Chest 2 View  09/08/2015  CLINICAL DATA:  Fever, cough, and chest congestion. Symptoms  for 4 days. EXAM: CHEST  2 VIEW COMPARISON:  07/17/2013 FINDINGS: Diffuse interstitial prominence is again seen and not significantly changed. The cardiomediastinal contours are normal. Pulmonary vasculature is normal. No consolidation, pleural effusion, or pneumothorax. No acute osseous abnormalities are seen. IMPRESSION: Diffuse interstitial prominence is stable from prior exam and chronic, may be interstitial lung disease or related smoking. No superimposed acute process. Electronically Signed   By: Rubye Oaks M.D.   On: 09/08/2015 21:46  Imaging Review Dg Chest 2 View  09/08/2015  CLINICAL DATA:  Fever, cough, and chest congestion. Symptoms for 4 days. EXAM: CHEST  2 VIEW COMPARISON:  07/17/2013 FINDINGS: Diffuse interstitial prominence is again seen and not significantly changed. The cardiomediastinal contours are normal. Pulmonary vasculature is normal. No consolidation, pleural effusion, or pneumothorax. No acute osseous abnormalities are seen. IMPRESSION: Diffuse interstitial prominence is stable from prior exam and chronic, may be interstitial lung disease or related smoking. No superimposed acute process. Electronically Signed   By: Rubye Oaks M.D.   On: 09/08/2015 21:46   I have personally reviewed and evaluated these images and lab results as part of my medical decision-making.    MDM   Final diagnoses:  Cough    Patient with cough, fever, and chest congestion with wheezing and SOB with exertion.  CXR is negative for acute superimposed infection, but shows evidence of ILD.  Will give breathing treatment, nebs, and solumedrol and reassess.  11:39 PM Patient reassessed after second nebulizer treatment. He still has diffuse rales. His oxygen saturation has normalized. I will give him an hour-long breathing treatment and reassess.  Patient reassessed, finishing up CAT nebs.  Lungs are much clearer, but he still has some end expiratory rales.  Will ambulate patient with  pulse oximeter. Plan for discharge to home.  Patient ambulates maintaining >90% O2 sat.  He looks well.  Speaks clearly.    Will treat with prednisone and z-pak.  Will DC patient with inhaler and instructions to follow-up with PCP/pulmonology.    Roxy Horseman, PA-C 09/09/15 1610  Loren Racer, MD 09/09/15 [phone]

## 2015-09-09 MED ORDER — ALBUTEROL SULFATE HFA 108 (90 BASE) MCG/ACT IN AERS
2.0000 | INHALATION_SPRAY | RESPIRATORY_TRACT | Status: DC | PRN
  Filled 2015-09-09: qty 6.7

## 2015-09-09 MED ORDER — PREDNISONE 20 MG PO TABS: 40.0000 mg | ORAL_TABLET | Freq: Every day | ORAL | Status: DC

## 2015-09-09 MED ORDER — AZITHROMYCIN 250 MG PO TABS: 250.0000 mg | ORAL_TABLET | Freq: Every day | ORAL | Status: DC

## 2015-09-09 NOTE — Discharge Instructions (Signed)
Cough, Adult °Coughing is a reflex that clears your throat and your airways. Coughing helps to heal and protect your lungs. It is normal to cough occasionally, but a cough that happens with other symptoms or lasts a long time may be a sign of a condition that needs treatment. A cough may last only 2-3 weeks (acute), or it may last longer than 8 weeks (chronic). °CAUSES °Coughing is commonly caused by: °· Breathing in substances that irritate your lungs. °· A viral or bacterial respiratory infection. °· Allergies. °· Asthma. °· Postnasal drip. °· Smoking. °· Acid backing up from the stomach into the esophagus (gastroesophageal reflux). °· Certain medicines. °· Chronic lung problems, including COPD (or rarely, lung cancer). °· Other medical conditions such as heart failure. °HOME CARE INSTRUCTIONS  °Pay attention to any changes in your symptoms. Take these actions to help with your discomfort: °· Take medicines only as told by your health care provider. °¨ If you were prescribed an antibiotic medicine, take it as told by your health care provider. Do not stop taking the antibiotic even if you start to feel better. °¨ Talk with your health care provider before you take a cough suppressant medicine. °· Drink enough fluid to keep your urine clear or pale yellow. °· If the air is dry, use a cold steam vaporizer or humidifier in your bedroom or your home to help loosen secretions. °· Avoid anything that causes you to cough at work or at home. °· If your cough is worse at night, try sleeping in a semi-upright position. °· Avoid cigarette smoke. If you smoke, quit smoking. If you need help quitting, ask your health care provider. °· Avoid caffeine. °· Avoid alcohol. °· Rest as needed. °SEEK MEDICAL CARE IF:  °· You have new symptoms. °· You cough up pus. °· Your cough does not get better after 2-3 weeks, or your cough gets worse. °· You cannot control your cough with suppressant medicines and you are losing sleep. °· You  develop pain that is getting worse or pain that is not controlled with pain medicines. °· You have a fever. °· You have unexplained weight loss. °· You have night sweats. °SEEK IMMEDIATE MEDICAL CARE IF: °· You cough up blood. °· You have difficulty breathing. °· Your heartbeat is very fast. °  °This information is not intended to replace advice given to you by your health care provider. Make sure you discuss any questions you have with your health care provider. °  °Document Released: 05/03/2011 Document Revised: 07/26/2015 Document Reviewed: 01/11/2015 °Elsevier Interactive Patient Education ©2016 [employer] °Bronchospasm, Adult °A bronchospasm is when the tubes that carry air in and out of your lungs (airways) spasm or tighten. During a bronchospasm it is hard to breathe. This is because the airways get smaller. A bronchospasm can be triggered by: °· Allergies. These may be to animals, pollen, food, or mold. °· Infection. This is a common cause of bronchospasm. °· Exercise. °· Irritants. These include pollution, cigarette smoke, strong odors, aerosol sprays, and paint fumes. °· Weather changes. °· Stress. °· Being emotional. °HOME CARE  °· Always have a plan for getting help. Know when to call your doctor and local emergency services (911 in the [place] Know where you can get emergency care. °· Only take medicines as told by your doctor. °· If you were prescribed an inhaler or nebulizer machine, ask your doctor how to use it correctly. Always use a spacer with your inhaler if you were given   one. °· Stay calm during an attack. Try to relax and breathe more slowly. °· Control your home environment: °¨ Change your heating and air conditioning filter at least once a month. °¨ Limit your use of fireplaces and wood stoves. °¨ Do not  smoke. Do not  allow smoking in your home. °¨ Avoid perfumes and fragrances. °¨ Get rid of pests (such as roaches and mice) and their droppings. °¨ Throw away plants if you see mold on  them. °¨ Keep your house clean and dust free. °¨ Replace carpet with wood, tile, or vinyl flooring. Carpet can trap dander and dust. °¨ Use allergy-proof pillows, mattress covers, and box spring covers. °¨ Wash bed sheets and blankets every week in hot water. Dry them in a dryer. °¨ Use blankets that are made of polyester or cotton. °¨ Wash hands frequently. °GET HELP IF: °· You have muscle aches. °· You have chest pain. °· The thick spit you spit or cough up (sputum) changes from clear or white to yellow, green, gray, or bloody. °· The thick spit you spit or cough up gets thicker. °· There are problems that may be related to the medicine you are given such as: °¨ A rash. °¨ Itching. °¨ Swelling. °¨ Trouble breathing. °GET HELP RIGHT AWAY IF: °· You feel you cannot breathe or catch your breath. °· You cannot stop coughing. °· Your treatment is not helping you breathe better. °· You have very bad chest pain. °MAKE SURE YOU:  °· Understand these instructions. °· Will watch your condition. °· Will get help right away if you are not doing well or get worse. °  °This information is not intended to replace advice given to you by your health care provider. Make sure you discuss any questions you have with your health care provider. °  °Document Released: 09/01/2009 Document Revised: 11/25/2014 Document Reviewed: 04/27/2013 °Elsevier Interactive Patient Education ©2016 Elsevier Inc. ° °

## 2015-09-19 ENCOUNTER — Emergency Department (HOSPITAL_COMMUNITY)
Admission: EM | Admit: 2015-09-19 | Discharge: 2015-09-21 | Disposition: A | Discharge status: Other Acute Inpatient Hospital | Payer: Self-pay | Attending: Emergency Medicine | Admitting: Emergency Medicine

## 2015-09-19 DIAGNOSIS — Z79899 Other long term (current) drug therapy: Secondary | ICD-10-CM | POA: Insufficient documentation

## 2015-09-19 DIAGNOSIS — Z792 Long term (current) use of antibiotics: Secondary | ICD-10-CM | POA: Insufficient documentation

## 2015-09-19 DIAGNOSIS — Z8719 Personal history of other diseases of the digestive system: Secondary | ICD-10-CM | POA: Insufficient documentation

## 2015-09-19 DIAGNOSIS — M419 Scoliosis, unspecified: Secondary | ICD-10-CM | POA: Insufficient documentation

## 2015-09-19 DIAGNOSIS — M1990 Unspecified osteoarthritis, unspecified site: Secondary | ICD-10-CM | POA: Insufficient documentation

## 2015-09-19 DIAGNOSIS — F314 Bipolar disorder, current episode depressed, severe, without psychotic features: Secondary | ICD-10-CM | POA: Diagnosis present

## 2015-09-19 DIAGNOSIS — Z720 Tobacco use: Secondary | ICD-10-CM | POA: Insufficient documentation

## 2015-09-19 DIAGNOSIS — F1020 Alcohol dependence, uncomplicated: Secondary | ICD-10-CM | POA: Insufficient documentation

## 2015-09-19 DIAGNOSIS — R45851 Suicidal ideations: Secondary | ICD-10-CM | POA: Insufficient documentation

## 2015-09-19 DIAGNOSIS — Z7952 Long term (current) use of systemic steroids: Secondary | ICD-10-CM | POA: Insufficient documentation

## 2015-09-19 LAB — CBC
HEMATOCRIT: 48.7 % (ref 39.0–52.0)
Hemoglobin: 17.5 g/dL — ABNORMAL HIGH (ref 13.0–17.0)
MCH: 33.5 pg (ref 26.0–34.0)
MCHC: 35.9 g/dL (ref 30.0–36.0)
MCV: 93.3 fL (ref 78.0–100.0)
Platelets: 328 10*3/uL (ref 150–400)
RBC: 5.22 MIL/uL (ref 4.22–5.81)
RDW: 13.2 % (ref 11.5–15.5)
WBC: 10.6 10*3/uL — ABNORMAL HIGH (ref 4.0–10.5)

## 2015-09-19 LAB — COMPREHENSIVE METABOLIC PANEL
ALBUMIN: 4.6 g/dL (ref 3.5–5.0)
ALK PHOS: 61 U/L (ref 38–126)
ALT: 67 U/L — AB (ref 17–63)
AST: 46 U/L — AB (ref 15–41)
Anion gap: 10 (ref 5–15)
BILIRUBIN TOTAL: 0.6 mg/dL (ref 0.3–1.2)
BUN: 11 mg/dL (ref 6–20)
CALCIUM: 9.7 mg/dL (ref 8.9–10.3)
CO2: 28 mmol/L (ref 22–32)
CREATININE: 1.25 mg/dL — AB (ref 0.61–1.24)
Chloride: 101 mmol/L (ref 101–111)
GFR calc Af Amer: >60 mL/min (ref >60)
GFR calc non Af Amer: >60 mL/min (ref >60)
GLUCOSE: 97 mg/dL (ref 65–99)
Potassium: 4.7 mmol/L (ref 3.5–5.1)
Sodium: 139 mmol/L (ref 135–145)
Total Protein: 7.8 g/dL (ref 6.5–8.1)

## 2015-09-19 LAB — RAPID URINE DRUG SCREEN, HOSP PERFORMED
Amphetamines: NOT DETECTED
BARBITURATES: NOT DETECTED
BENZODIAZEPINES: NOT DETECTED
Cocaine: NOT DETECTED
Opiates: NOT DETECTED
TETRAHYDROCANNABINOL: NOT DETECTED

## 2015-09-19 LAB — SALICYLATE LEVEL: Salicylate Lvl: <4 mg/dL (ref 2.8–30.0)

## 2015-09-19 LAB — ACETAMINOPHEN LEVEL: Acetaminophen (Tylenol), Serum: <10 ug/mL — ABNORMAL LOW (ref 10–30)

## 2015-09-19 LAB — ETHANOL: Alcohol, Ethyl (B): <5 mg/dL (ref <5)

## 2015-09-19 MED ORDER — LORATADINE 10 MG PO TABS
10.0000 mg | ORAL_TABLET | Freq: Every day | ORAL | Status: DC
  Administered 2015-09-19 – 2015-09-21 (×3): 10 mg via ORAL
  Filled 2015-09-19 (×4): qty 1

## 2015-09-19 MED ORDER — LORAZEPAM 1 MG PO TABS
1.0000 mg | ORAL_TABLET | Freq: Three times a day (TID) | ORAL | Status: DC | PRN
  Administered 2015-09-19: 1 mg via ORAL
  Filled 2015-09-19: qty 1

## 2015-09-19 MED ORDER — NICOTINE 21 MG/24HR TD PT24
21.0000 mg | MEDICATED_PATCH | Freq: Every day | TRANSDERMAL | Status: DC
  Administered 2015-09-19 – 2015-09-21 (×3): 21 mg via TRANSDERMAL
  Filled 2015-09-19 (×3): qty 1

## 2015-09-19 MED ORDER — ONDANSETRON HCL 4 MG PO TABS: 4.0000 mg | ORAL_TABLET | Freq: Three times a day (TID) | ORAL | Status: DC | PRN

## 2015-09-19 MED ORDER — IBUPROFEN 200 MG PO TABS: 600.0000 mg | ORAL_TABLET | Freq: Three times a day (TID) | ORAL | Status: DC | PRN

## 2015-09-19 MED ORDER — ALUM & MAG HYDROXIDE-SIMETH 200-200-20 MG/5ML PO SUSP: 30.0000 mL | ORAL | Status: DC | PRN
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
## 2015-09-19 NOTE — Progress Notes (Addendum)
Patient was referred to [facility]. [facility].  Per [facility] clinician Claris CheMargaret, authorization#: [id number] starting 09/19/15 through 11/07. Demographics [facility].  [facility], and [facility].   Dalton Molina, LCSWA Disposition staff 09/19/2015 8:08 PM

## 2015-09-19 NOTE — ED Notes (Signed)
Patient was observed lying on stretcher with eyes opened. Patient states he has been off medication and need to get back on track. Patient alert and cooperative but is sleepy. Patient allowed to express feelings. Encouragement and support given. Q 15 minute checks in progress and monitoring continues.
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
## 2015-09-19 NOTE — ED Provider Notes (Signed)
CSN: [id number]     Arrival date & time 09/19/15  1531 History  By signing my name below, I, Ronney LionSuzanne Le, attest that this documentation has been prepared under the direction and in the presence of Arthor CaptainAbigail Rand Boller, PA-C. Electronically Signed: Ronney LionSuzanne Le, ED Scribe. 09/19/2015. 4:38 PM.    Chief Complaint  Patient presents with  . IVC    The history is provided by the patient. No language interpreter was used.    HPI Comments: Dalton Molina is a 49 y.o. male with a history of depression, bipolar, and alcohol abuse, who presents to the Emergency Department after being [facility]arily committed [facility] for SI. He endorses associated HI, stating, "I wouldn't mind taking other people with me." He denies any specific plan and denies access to weapons. Patient reports a history of suicide attempts. Patient denies having auditory or visual hallucinations but reports having flashbacks to "traumatic stuff" as a child. Patient reports he has been off his bipolar medications for about 1 month. He denies EtOH consumption or illicit drug use, stating he cannot afford them. He notes a lung infection which he was treated for 1 week ago but otherwise denies any medical complaints.  Past Medical History  Diagnosis Date  . Osteoarthritis   . Alcoholism /alcohol abuse (HCC)   . GERD (gastroesophageal reflux disease)   . Scoliosis   . Depression    Past Surgical History  Procedure Laterality Date  . Appendectomy     Family History  Problem Relation Age of Onset  . Cancer Mother     bladder  . Cancer Father     mesothelioma   Social History  Substance Use Topics  . Smoking status: Current Every Day Smoker -- 0.00 packs/day for 0 years    Types: Cigarettes  . Smokeless tobacco: Not on file  . Alcohol Use: Yes     Comment: reports is an alcoholic    Review of Systems  Psychiatric/Behavioral: Positive for suicidal ideas and dysphoric mood.  All other systems reviewed and are negative.  Allergies   Percocet  Home Medications   Prior to Admission medications   Medication Sig Start Date End Date Taking? Authorizing Provider  cetirizine (ZYRTEC) 10 MG tablet Take 1 tablet (10 mg total) by mouth daily as needed for allergies. 08/06/13  Yes Sanjuana KavaAgnes I Nwoko, NP  divalproex (DEPAKOTE) 500 MG DR tablet Take 500 mg by mouth 2 (two) times daily.   Yes Historical Provider, MD  FLUoxetine (PROZAC) 20 MG capsule Take 1 capsule (20 mg total) by mouth daily. For depression 08/06/13  Yes Sanjuana KavaAgnes I Nwoko, NP  hydrOXYzine (VISTARIL) 50 MG capsule Take 50 mg by mouth daily.   Yes Historical Provider, MD  risperiDONE (RISPERDAL) 0.5 MG tablet Take 0.5 mg by mouth daily.   Yes Historical Provider, MD  azithromycin (ZITHROMAX Z-PAK) 250 MG tablet Take 1 tablet (250 mg total) by mouth daily. 500mg  PO day 1, then 250mg  PO days 205 09/09/15   Roxy Horsemanobert Browning, PA-C  predniSONE (DELTASONE) 20 MG tablet Take 2 tablets (40 mg total) by mouth daily. 09/09/15   Roxy Horsemanobert Browning, PA-C  traZODone (DESYREL) 50 MG tablet Take 1 tablet (50 mg total) by mouth at bedtime. For sleep Patient not taking: Reported on 09/08/2015 08/06/13   Sanjuana KavaAgnes I Nwoko, NP   BP 106/68 mmHg  Pulse 82  Temp(Src) 98 F (36.7 C) (Oral)  Resp 20  SpO2 95% Physical Exam  Constitutional: He is oriented to person, place, and time. He  appears well-developed and well-nourished. No distress.  HENT:  Head: Normocephalic and atraumatic.  Eyes: Conjunctivae and EOM are normal.  Neck: Neck supple. No tracheal deviation present.  Cardiovascular: Normal rate.   Pulmonary/Chest: Effort normal. No respiratory distress.  Musculoskeletal: Normal range of motion.  Wearing ankle brace. [employer] are aware.  Neurological: He is alert and oriented to person, place, and time.  Skin: Skin is warm and dry.  Psychiatric: His behavior is normal. He expresses homicidal and suicidal ideation.  Nursing note and vitals reviewed.   ED Course  Procedures (including critical  care time)  DIAGNOSTIC STUDIES: Oxygen Saturation is 98% on RA, normal by my interpretation.    COORDINATION OF CARE: 4:25 PM - Discussed treatment plan with pt at bedside which includes evaluation by psychiatrist. Pt verbalized understanding.   Labs Review Labs Reviewed  COMPREHENSIVE METABOLIC PANEL - Abnormal; Notable for the following:    Creatinine, Ser 1.25 (*)    AST 46 (*)    ALT 67 (*)    All other components within normal limits  ACETAMINOPHEN LEVEL - Abnormal; Notable for the following:    Acetaminophen (Tylenol), Serum <10 (*)    All other components within normal limits  CBC - Abnormal; Notable for the following:    WBC 10.6 (*)    Hemoglobin 17.5 (*)    All other components within normal limits  ETHANOL  SALICYLATE LEVEL  URINE RAPID DRUG SCREEN, HOSP PERFORMED    Imaging Review No results found. I have personally reviewed and evaluated these images and lab results as part of my medical decision-making.   EKG Interpretation None      MDM   Final diagnoses:  Bipolar 1 disorder, depressed, severe (HCC)  Uncomplicated alcohol dependence (HCC)   Patient under IVC. Medically clear for psych eval   I personally performed the services described in this documentation, which was scribed in my presence. The recorded information has been reviewed and is accurate.        Arthor Captain, PA-C 09/22/15 1348  Gerhard Munch, MD 09/25/15 1053

## 2015-09-19 NOTE — ED Notes (Addendum)
Pt brought in by [employer], IVC, cannot go back to [facility] because pt is a sex offender. Pt has ankle monitor.  IVC paperwork states "pt with hx of bipolar disorder and has been off his medicines for 1 month. He is heaving thoughts to hang himself and has attempted to do that before. He is felt to be a danger to self."   Pt contracts for safety. Upon rn assessment pt reports he is SI x1 month, he is homeless. Reports he has an ankle monitor at this time because he "is a piece of shit". Denies HI, AH/VH. Denies ETOH use (cannot afford it). Reports cigarette use. Denies illegal drugs. Denies pain.
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
## 2015-09-20 DIAGNOSIS — F314 Bipolar disorder, current episode depressed, severe, without psychotic features: Secondary | ICD-10-CM | POA: Diagnosis present

## 2015-09-20 DIAGNOSIS — R45851 Suicidal ideations: Secondary | ICD-10-CM

## 2015-09-20 MED ORDER — HYDROXYZINE HCL 25 MG PO TABS: 25.0000 mg | ORAL_TABLET | Freq: Three times a day (TID) | ORAL | Status: DC | PRN

## 2015-09-20 MED ORDER — GABAPENTIN 300 MG PO CAPS
300.0000 mg | ORAL_CAPSULE | Freq: Two times a day (BID) | ORAL | Status: DC
  Administered 2015-09-20 – 2015-09-21 (×3): 300 mg via ORAL
  Filled 2015-09-20 (×3): qty 1

## 2015-09-20 MED ORDER — FLUOXETINE HCL 10 MG PO CAPS
10.0000 mg | ORAL_CAPSULE | Freq: Every day | ORAL | Status: DC
  Administered 2015-09-20 – 2015-09-21 (×2): 10 mg via ORAL
  Filled 2015-09-20 (×2): qty 1

## 2015-09-20 MED ORDER — HYDROXYZINE HCL 25 MG PO TABS
50.0000 mg | ORAL_TABLET | Freq: Every day | ORAL | Status: DC
  Administered 2015-09-20: 50 mg via ORAL
  Filled 2015-09-20: qty 2
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
## 2015-09-20 NOTE — BH Assessment (Signed)
[facility] Assessment Progress Note  At 11:23 this Clinical research associatewriter spoke to [place] [facility] [facility] about [facility] for this pt.  She took demographic information verbally, then stated that additional referral information is needed. This information was sent.  At 12:00 I called again and Sonya confirmed receipt of information.  She adds that pt has been accepted to their priority wait list.  Doylene Canninghomas Kwadwo Taras, MA Triage Specialist [phone]

## 2015-09-20 NOTE — BH Assessment (Signed)
[facility] Assessment Progress Note  The following facilities have been contacted to seek placement for this pt, with results as noted:  Beds available, information sent, decision pending:  [place] [facility]   Declined:  [place] (reason unspecified) [facility] (reason unspecified) [facility] (registered sex offender - exclusionary) [facility] (registered sex offender - exclusionary)  [facility]:  [facility]   Orlene Salmons, [place] Triage Specialist [phone]

## 2015-09-20 NOTE — ED Notes (Signed)
Pt denies SI, HI, AVH.  He does continue to feel depressed.  Pts. Parole officer came this am and removed his ankle bracelet temporarily .  15 minute checks and video monitoring continue.

## 2015-09-21 MED ORDER — ALBUTEROL SULFATE HFA 108 (90 BASE) MCG/ACT IN AERS: 2.0000 | INHALATION_SPRAY | Freq: Four times a day (QID) | RESPIRATORY_TRACT | Status: DC | PRN

## 2015-09-21 NOTE — ED Notes (Signed)
Patient [facility] to [facility].  [facility]  He has been pleasant and cooperative throughout the shift.  He remains depressed with thoughts of wanting to "end it all."  All belongings given to the transporter.

## 2015-09-21 NOTE — ED Notes (Signed)
[facility] with no plan but denies HI and AVH at this time. Plan of care discussed with patient. Patient voices no complaints or concerns at this time. Encouragement and support provided and safety maintain. Q 15 min safety checks remain in place.

## 2015-09-21 NOTE — Consult Note (Signed)
[place] [facility] Consult   Reason for Consult:  Psychiatric Evaluation  Referring Physician:  EDP Patient Identification: Dalton Molina MRN:  [id number] Principal Diagnosis: Bipolar 1 disorder, depressed, severe ([place]) Diagnosis:   Patient Active Problem List   Diagnosis Date Noted  . Bipolar 1 disorder, depressed, severe ([place]) [F31.4] 09/20/2015    Priority: High  . Alcohol dependence ([place]) [F10.20] 08/04/2013  . Anxiety state, unspecified [F41.1] 08/04/2013  . Suicidal thoughts [R45.851] 07/26/2013  . Depressive disorder [F32.9] 07/18/2013  . Alcohol abuse [F10.10] 07/18/2013  . S/P alcohol detoxification [Z09] 07/18/2013    Total Time spent with patient: 30 minutes  Subjective:   Dalton Molina is a 49 y.o. male patient who states "I am frustrated about life."  HPI:  Dalton Molina is a 49 yo Caucasian male who presented to [facility] for evaluation. He states his probation officer became worried about him and took to [place] after endorsing suicidal ideation with a plan to run into traffic. He states "I caught up on some rest but I am still frustrated about life." He states he is prescribed Depakote, Prozac, Vistaril, and Risperdal and that he has been off these medications for the past 6 weeks due to being homeless and not being able to afford his medication. He continues to endorse suicidal ideation and reports feeling hopeless. He identifies his stressor as his living situation and that being homeless is "demoralizing and makes you feel less than."  He denies drug use. States he last used alcohol 4-5 nights when he drank "a few cans of beer and that made me mad because it didn't do anything."  He is currently on 9 month parole. States his parole officer came today and removed his ankle bracelet monitoring system. States his parole ends 12/13/2015.  He denies homicidal ideation, intent or plan. He denies AVH.   Today:  Patient continues to endorse suicidal ideations, needs placement in  a psychiatric hospital  Past Psychiatric History: Bipolar, depression, PTSD  Risk to Self: Suicidal Ideation: Yes-Currently Present Suicidal Intent: Yes-Currently Present Is patient at risk for suicide?: Yes Suicidal Plan?: Yes-Currently Present Specify Current Suicidal Plan: to get drunk and run into traffic Access to Means: Yes Specify Access to Suicidal Means: can run into traffic What has been your use of drugs/alcohol within the last 12 months?: pt reports he is an alcoholic How many times?: 1 Other Self Harm Risks: na-pt denies Triggers for Past Attempts: Other (Comment) (depression) Intentional Self Injurious Behavior: None Risk to Others: Homicidal Ideation: No Thoughts of Harm to Others: No-Not Currently Present/Within Last 6 Months Current Homicidal Intent: No Current Homicidal Plan: No Access to Homicidal Means: No Identified Victim: na-pt denies History of harm to others?: Yes Assessment of Violence: In past 6-12 months Violent Behavior Description: has gotten into fights in the past Does patient have access to weapons?: No Criminal Charges Pending?: No Does patient have a court date: No Prior Inpatient Therapy: Prior Inpatient Therapy: Yes Prior Therapy Dates: 2014 and other unk dates Prior Therapy Facilty/Provider(s): [facility] [place]. [facility] Reason for Treatment: psychosis Prior Outpatient Therapy: Prior Outpatient Therapy: Yes Prior Therapy Dates: Current since 2014 Prior Therapy Facilty/Provider(s): [facility] Reason for Treatment: med mgnt Does patient have an ACCT team?: No Does patient have Intensive In-House Services?  : No Does patient have [facility] services? : No Does patient have P4CC services?: No  Past Medical History:  Past Medical History  Diagnosis Date  . Osteoarthritis   . Alcoholism /alcohol abuse ([place])   .  GERD (gastroesophageal reflux disease)   . Scoliosis   . Depression     Past Surgical History  Procedure Laterality Date  . Appendectomy      Family History:  Family History  Problem Relation Age of Onset  . Cancer Mother     bladder  . Cancer Father     mesothelioma   Family Psychiatric  History: Denies Social History:  History  Alcohol Use  . Yes    Comment: reports is an alcoholic     History  Drug Use  . Yes  . Special: Benzodiazepines    Social History   Social History  . Marital Status: Single    Spouse Name: N/A  . Number of Children: N/A  . Years of Education: N/A   Social History Main Topics  . Smoking status: Current Every Day Smoker -- 0.00 packs/day for 0 years    Types: Cigarettes  . Smokeless tobacco: Not on file  . Alcohol Use: Yes     Comment: reports is an alcoholic  . Drug Use: Yes    Special: Benzodiazepines  . Sexual Activity: Yes   Other Topics Concern  . Not on file   Social History Narrative   Additional Social History:    Pain Medications: see med list Prescriptions: see med list Over the Counter: see med list History of alcohol / drug use?: Yes Longest period of sobriety (when/how long): unk Negative Consequences of Use: Financial, Legal, Personal relationships, Work / School Name of Substance 1: Alcohol 1 - Age of First Use: 8 1 - Amount (size/oz): varies, prefers to drink "until I pass out" 1 - Frequency: varies 1 - Duration: ongoing 1 - Last Use / Amount: 3 nights ago-2 beer  Allergies:   Allergies  Allergen Reactions  . Percocet [Oxycodone-Acetaminophen] Anaphylaxis and Rash    Labs:  Results for orders placed or performed during the hospital encounter of 09/19/15 (from the past 48 hour(s))  Comprehensive metabolic panel     Status: Abnormal   Collection Time: 09/19/15  3:59 PM  Result Value Ref Range   Sodium 139 135 - 145 mmol/L   Potassium 4.7 3.5 - 5.1 mmol/L   Chloride 101 101 - 111 mmol/L   CO2 28 22 - 32 mmol/L   Glucose, Bld 97 65 - 99 mg/dL   BUN 11 6 - 20 mg/dL   Creatinine, Ser 1.25 (H) 0.61 - 1.24 mg/dL   Calcium 9.7 8.9 - 10.3 mg/dL    Total Protein 7.8 6.5 - 8.1 g/dL   Albumin 4.6 3.5 - 5.0 g/dL   AST 46 (H) 15 - 41 U/L   ALT 67 (H) 17 - 63 U/L   Alkaline Phosphatase 61 38 - 126 U/L   Total Bilirubin 0.6 0.3 - 1.2 mg/dL   GFR calc non Af Amer >60 >60 mL/min   GFR calc Af Amer >60 >60 mL/min    Comment: (NOTE) The eGFR has been calculated using the CKD EPI equation. This calculation has not been validated in all clinical situations. eGFR's persistently <60 mL/min signify possible Chronic Kidney Disease.    Anion gap 10 5 - 15  CBC     Status: Abnormal   Collection Time: 09/19/15  3:59 PM  Result Value Ref Range   WBC 10.6 (H) 4.0 - 10.5 K/uL   RBC 5.22 4.22 - 5.81 MIL/uL   Hemoglobin 17.5 (H) 13.0 - 17.0 g/dL   HCT 48.7 39.0 - 52.0 %   MCV 93.3  78.0 - 100.0 fL   MCH 33.5 26.0 - 34.0 pg   MCHC 35.9 30.0 - 36.0 g/dL   RDW 13.2 11.5 - 15.5 %   Platelets 328 150 - 400 K/uL  Ethanol (ETOH)     Status: None   Collection Time: 09/19/15  4:00 PM  Result Value Ref Range   Alcohol, Ethyl (B) <5 <5 mg/dL    Comment:        LOWEST DETECTABLE LIMIT FOR SERUM ALCOHOL IS 5 mg/dL FOR MEDICAL PURPOSES ONLY   Salicylate level     Status: None   Collection Time: 09/19/15  4:00 PM  Result Value Ref Range   Salicylate Lvl <8.1 2.8 - 30.0 mg/dL  Acetaminophen level     Status: Abnormal   Collection Time: 09/19/15  4:00 PM  Result Value Ref Range   Acetaminophen (Tylenol), Serum <10 (L) 10 - 30 ug/mL    Comment:        THERAPEUTIC CONCENTRATIONS VARY SIGNIFICANTLY. A RANGE OF 10-30 ug/mL MAY BE AN EFFECTIVE CONCENTRATION FOR MANY PATIENTS. HOWEVER, SOME ARE BEST TREATED AT CONCENTRATIONS OUTSIDE THIS RANGE. ACETAMINOPHEN CONCENTRATIONS >150 ug/mL AT 4 HOURS AFTER INGESTION AND >50 ug/mL AT 12 HOURS AFTER INGESTION ARE OFTEN ASSOCIATED WITH TOXIC REACTIONS.   Urine rapid drug screen (hosp performed) (Not at [facility])     Status: None   Collection Time: 09/19/15  4:15 PM  Result Value Ref Range   Opiates NONE  DETECTED NONE DETECTED   Cocaine NONE DETECTED NONE DETECTED   Benzodiazepines NONE DETECTED NONE DETECTED   Amphetamines NONE DETECTED NONE DETECTED   Tetrahydrocannabinol NONE DETECTED NONE DETECTED   Barbiturates NONE DETECTED NONE DETECTED    Comment:        DRUG SCREEN FOR MEDICAL PURPOSES ONLY.  IF CONFIRMATION IS NEEDED FOR ANY PURPOSE, NOTIFY LAB WITHIN 5 DAYS.        LOWEST DETECTABLE LIMITS FOR URINE DRUG SCREEN Drug Class       Cutoff (ng/mL) Amphetamine      1000 Barbiturate      200 Benzodiazepine   829 Tricyclics       937 Opiates          300 Cocaine          300 THC              50     Current Facility-Administered Medications  Medication Dose Route Frequency Provider Last Rate Last Dose  . albuterol (PROVENTIL HFA;VENTOLIN HFA) 108 (90 BASE) MCG/ACT inhaler 2 puff  2 puff Inhalation Q6H PRN Patrecia Pour, NP      . alum & mag hydroxide-simeth (MAALOX/MYLANTA) 200-200-20 MG/5ML suspension 30 mL  30 mL Oral PRN Margarita Mail, PA-C      . FLUoxetine (PROZAC) capsule 10 mg  10 mg Oral Daily Namira Rosekrans   10 mg at 09/21/15 1035  . gabapentin (NEURONTIN) capsule 300 mg  300 mg Oral BID Angee Gupton   300 mg at 09/21/15 1035  . hydrOXYzine (ATARAX/VISTARIL) tablet 25 mg  25 mg Oral TID PRN Paysen Goza      . hydrOXYzine (ATARAX/VISTARIL) tablet 50 mg  50 mg Oral QHS Shyane Fossum   50 mg at 09/20/15 2154  . ibuprofen (ADVIL,MOTRIN) tablet 600 mg  600 mg Oral Q8H PRN Margarita Mail, PA-C      . loratadine (CLARITIN) tablet 10 mg  10 mg Oral Daily Margarita Mail, PA-C   10 mg at 09/21/15 1035  .  nicotine (NICODERM CQ - dosed in mg/24 hours) patch 21 mg  21 mg Transdermal Daily Margarita Mail, PA-C   21 mg at 09/21/15 1037  . ondansetron (ZOFRAN) tablet 4 mg  4 mg Oral Q8H PRN Margarita Mail, PA-C       Current Outpatient Prescriptions  Medication Sig Dispense Refill  . cetirizine (ZYRTEC) 10 MG tablet Take 1 tablet (10 mg total) by mouth daily as needed  for allergies.    Marland Kitchen divalproex (DEPAKOTE) 500 MG DR tablet Take 500 mg by mouth 2 (two) times daily.    Marland Kitchen FLUoxetine (PROZAC) 20 MG capsule Take 1 capsule (20 mg total) by mouth daily. For depression 30 capsule 0  . hydrOXYzine (VISTARIL) 50 MG capsule Take 50 mg by mouth daily.    . risperiDONE (RISPERDAL) 0.5 MG tablet Take 0.5 mg by mouth daily.    Marland Kitchen azithromycin (ZITHROMAX Z-PAK) 250 MG tablet Take 1 tablet (250 mg total) by mouth daily. $RemoveBefo'500mg'zbDpNkMjgFU$  PO day 1, then $Remov'250mg'fGujDy$  PO days 205 6 tablet 0  . predniSONE (DELTASONE) 20 MG tablet Take 2 tablets (40 mg total) by mouth daily. 10 tablet 0  . traZODone (DESYREL) 50 MG tablet Take 1 tablet (50 mg total) by mouth at bedtime. For sleep (Patient not taking: Reported on 09/08/2015) 30 tablet 0    Musculoskeletal: Strength & Muscle Tone: within normal limits Gait & Station: normal Patient leans: N/A  Psychiatric Specialty Exam: Review of Systems  Constitutional: Negative.   HENT: Negative.   Eyes: Negative.   Respiratory: Negative.   Cardiovascular: Negative.   Gastrointestinal: Negative.   Genitourinary: Negative.   Musculoskeletal: Negative.   Skin: Negative.   Neurological: Negative.   Endo/Heme/Allergies: Negative.   Psychiatric/Behavioral: Positive for depression and suicidal ideas.    Blood pressure 123/72, pulse 83, temperature 98.9 F (37.2 C), temperature source Oral, resp. rate 20, SpO2 94 %.There is no weight on file to calculate BMI.  General Appearance: Fairly Groomed  Engineer, water::  Good  Speech:  Clear and Coherent and Normal Rate  Volume:  Normal  Mood:  Depressed  Affect:  Depressed  Thought Process:  Coherent  Orientation:  Full (Time, Place, and Person)  Thought Content:  No psychosis  Suicidal Thoughts:  Yes.  without intent/plan  Homicidal Thoughts:  No  Memory:  Immediate;   Good Recent;   Good Remote;   Good  Judgement:  Fair  Insight:  Present  Psychomotor Activity:  Normal  Concentration:  Fair  Recall:   [employer] of Knowledge:Fair  Language: Fair  Akathisia:  No  Handed:  Right  AIMS (if indicated):     Assets:  Communication Skills Desire for Improvement Leisure Time Physical Health Resilience  ADL's:  Intact  Cognition: WNL  Sleep:      Treatment Plan Summary: Daily contact with patient to assess and evaluate symptoms and progress in treatment and Medication management:  Bipolar affective disorder, current episode depression, severe. -Crisis stabilization Continue medications: -Prozac 10 mg daily for depression -Gabapentin 300 mg twice daily for mood -Vistaril 25 mg three times daily as needed for anxiety and agitation  -Vistaril 50 mg at bedtime for insomnia -Individual counseling -Referral to [facility] with transfer  Disposition: Recommend psychiatric Inpatient admission when medically cleared. Supportive therapy provided about ongoing stressors.  Waylan Boga, [place] 09/21/2015 2:58 PM Patient seen face-to-face for psychiatric evaluation, chart reviewed and case discussed with the physician extender and developed treatment plan. Reviewed the information documented  and agree with the treatment plan. Corena Pilgrim, MD

## 2015-09-21 NOTE — BH Assessment (Signed)
[facility] Assessment Progress Note  At 08:31 Junious DresserConnie [facility].  Pt remains on wait list.  Doylene Canninghomas Garv Kuechle, MA Triage Specialist [phone]

## 2015-09-21 NOTE — BH Assessment (Signed)
[facility] Assessment Progress Note  Per Thedore MinsMojeed Akintayo, MD, this pt still requires psychiatric hospitalization.  At 14:40 Jun[facility] Dress[facility]Connie [facility]  Pt has been accepted to their facility.  Nanine MeansJamison Lord, NP, concurs with this decision.  Pt's nurse, Dawnaly, has been notified.  Pt is under IVC and [facility]  Doylene Canninghomas Issai Werling, MA Triage Specialist [phone]

## 2016-09-02 ENCOUNTER — Emergency Department (HOSPITAL_COMMUNITY)
Admission: EM | Admit: 2016-09-02 | Discharge: 2016-09-03 | Disposition: A | Discharge status: Other Acute Inpatient Hospital | Payer: Self-pay | Attending: Emergency Medicine | Admitting: Emergency Medicine

## 2016-09-02 ENCOUNTER — Encounter (HOSPITAL_COMMUNITY): Payer: Self-pay | Admitting: *Deleted

## 2016-09-02 DIAGNOSIS — F1414 Cocaine abuse with cocaine-induced mood disorder: Secondary | ICD-10-CM | POA: Insufficient documentation

## 2016-09-02 DIAGNOSIS — F329 Major depressive disorder, single episode, unspecified: Secondary | ICD-10-CM | POA: Insufficient documentation

## 2016-09-02 DIAGNOSIS — F1014 Alcohol abuse with alcohol-induced mood disorder: Secondary | ICD-10-CM

## 2016-09-02 DIAGNOSIS — F32A Depression, unspecified: Secondary | ICD-10-CM

## 2016-09-02 DIAGNOSIS — J449 Chronic obstructive pulmonary disease, unspecified: Secondary | ICD-10-CM | POA: Insufficient documentation

## 2016-09-02 DIAGNOSIS — F17210 Nicotine dependence, cigarettes, uncomplicated: Secondary | ICD-10-CM | POA: Insufficient documentation

## 2016-09-02 DIAGNOSIS — Z79899 Other long term (current) drug therapy: Secondary | ICD-10-CM | POA: Insufficient documentation

## 2016-09-02 HISTORY — DX: Chronic obstructive pulmonary disease, unspecified: J44.9

## 2016-09-02 LAB — CBC
HEMATOCRIT: 49.1 % (ref 39.0–52.0)
Hemoglobin: 17.6 g/dL — ABNORMAL HIGH (ref 13.0–17.0)
MCH: 33.8 pg (ref 26.0–34.0)
MCHC: 35.8 g/dL (ref 30.0–36.0)
MCV: 94.4 fL (ref 78.0–100.0)
Platelets: 214 10*3/uL (ref 150–400)
RBC: 5.2 MIL/uL (ref 4.22–5.81)
RDW: 13.5 % (ref 11.5–15.5)
WBC: 8.7 10*3/uL (ref 4.0–10.5)

## 2016-09-02 LAB — COMPREHENSIVE METABOLIC PANEL
ALBUMIN: 3.9 g/dL (ref 3.5–5.0)
ALK PHOS: 60 U/L (ref 38–126)
ALT: 75 U/L — ABNORMAL HIGH (ref 17–63)
AST: 47 U/L — AB (ref 15–41)
Anion gap: 8 (ref 5–15)
BILIRUBIN TOTAL: 0.8 mg/dL (ref 0.3–1.2)
BUN: 10 mg/dL (ref 6–20)
CALCIUM: 9.2 mg/dL (ref 8.9–10.3)
CO2: 21 mmol/L — ABNORMAL LOW (ref 22–32)
Chloride: 111 mmol/L (ref 101–111)
Creatinine, Ser: 0.97 mg/dL (ref 0.61–1.24)
GFR calc Af Amer: >60 mL/min (ref >60)
GFR calc non Af Amer: >60 mL/min (ref >60)
GLUCOSE: 103 mg/dL — AB (ref 65–99)
Potassium: 3.7 mmol/L (ref 3.5–5.1)
Sodium: 140 mmol/L (ref 135–145)
TOTAL PROTEIN: 6.9 g/dL (ref 6.5–8.1)

## 2016-09-02 LAB — RAPID URINE DRUG SCREEN, HOSP PERFORMED
Amphetamines: NOT DETECTED
BARBITURATES: NOT DETECTED
BENZODIAZEPINES: POSITIVE — AB
Cocaine: NOT DETECTED
Opiates: NOT DETECTED
Tetrahydrocannabinol: NOT DETECTED

## 2016-09-02 LAB — ETHANOL: Alcohol, Ethyl (B): <5 mg/dL (ref <5)

## 2016-09-02 LAB — ACETAMINOPHEN LEVEL: Acetaminophen (Tylenol), Serum: <10 ug/mL — ABNORMAL LOW (ref 10–30)

## 2016-09-02 LAB — SALICYLATE LEVEL: Salicylate Lvl: <7 mg/dL (ref 2.8–30.0)

## 2016-09-02 MED ORDER — ONDANSETRON HCL 4 MG PO TABS: 4.0000 mg | ORAL_TABLET | Freq: Three times a day (TID) | ORAL | Status: DC | PRN

## 2016-09-02 MED ORDER — THIAMINE HCL 100 MG/ML IJ SOLN: 100.0000 mg | Freq: Every day | INTRAMUSCULAR | Status: DC

## 2016-09-02 MED ORDER — LORAZEPAM 1 MG PO TABS
0.0000 mg | ORAL_TABLET | Freq: Four times a day (QID) | ORAL | Status: DC
  Administered 2016-09-03 (×2): 1 mg via ORAL
  Filled 2016-09-02 (×2): qty 1

## 2016-09-02 MED ORDER — HYDROXYZINE HCL 25 MG PO TABS
50.0000 mg | ORAL_TABLET | Freq: Every day | ORAL | Status: DC
  Administered 2016-09-03: 50 mg via ORAL
  Filled 2016-09-02: qty 2
  Filled 2016-09-02: qty 1

## 2016-09-02 MED ORDER — ALBUTEROL SULFATE HFA 108 (90 BASE) MCG/ACT IN AERS
2.0000 | INHALATION_SPRAY | Freq: Four times a day (QID) | RESPIRATORY_TRACT | Status: DC | PRN
  Administered 2016-09-03: 2 via RESPIRATORY_TRACT
  Filled 2016-09-02: qty 6.7

## 2016-09-02 MED ORDER — DIVALPROEX SODIUM 500 MG PO DR TAB
500.0000 mg | DELAYED_RELEASE_TABLET | Freq: Two times a day (BID) | ORAL | Status: DC
  Administered 2016-09-02 – 2016-09-03 (×2): 500 mg via ORAL
  Filled 2016-09-02 (×2): qty 1

## 2016-09-02 MED ORDER — VITAMIN B-1 100 MG PO TABS
100.0000 mg | ORAL_TABLET | Freq: Every day | ORAL | Status: DC
  Administered 2016-09-02 – 2016-09-03 (×2): 100 mg via ORAL
  Filled 2016-09-02 (×2): qty 1

## 2016-09-02 MED ORDER — TOPIRAMATE 25 MG PO TABS
50.0000 mg | ORAL_TABLET | Freq: Two times a day (BID) | ORAL | Status: DC
  Administered 2016-09-02 – 2016-09-03 (×2): 50 mg via ORAL
  Filled 2016-09-02 (×2): qty 2

## 2016-09-02 MED ORDER — LORAZEPAM 1 MG PO TABS: 0.0000 mg | ORAL_TABLET | Freq: Two times a day (BID) | ORAL | Status: DC

## 2016-09-02 MED ORDER — NICOTINE 21 MG/24HR TD PT24
21.0000 mg | MEDICATED_PATCH | Freq: Every day | TRANSDERMAL | Status: DC
  Administered 2016-09-02 – 2016-09-03 (×2): 21 mg via TRANSDERMAL
  Filled 2016-09-02 (×2): qty 1

## 2016-09-02 MED ORDER — TRAZODONE HCL 50 MG PO TABS
50.0000 mg | ORAL_TABLET | Freq: Every day | ORAL | Status: DC
  Administered 2016-09-02: 50 mg via ORAL
  Filled 2016-09-02: qty 1

## 2016-09-02 MED ORDER — FLUOXETINE HCL 20 MG PO CAPS
20.0000 mg | ORAL_CAPSULE | Freq: Every day | ORAL | Status: DC
  Administered 2016-09-02 – 2016-09-03 (×2): 20 mg via ORAL
  Filled 2016-09-02 (×2): qty 1

## 2016-09-02 MED ORDER — GABAPENTIN 300 MG PO CAPS
300.0000 mg | ORAL_CAPSULE | Freq: Three times a day (TID) | ORAL | Status: DC
  Administered 2016-09-02 – 2016-09-03 (×2): 300 mg via ORAL
  Filled 2016-09-02 (×2): qty 1

## 2016-09-02 MED ORDER — IBUPROFEN 200 MG PO TABS: 600.0000 mg | ORAL_TABLET | Freq: Three times a day (TID) | ORAL | Status: DC | PRN

## 2016-09-02 NOTE — ED Notes (Signed)
Bed: [facility] Expected date:  Expected time:  Means of arrival:  Comments: 

## 2016-09-02 NOTE — ED Notes (Signed)
Pt has two suitcases of clothes and in his belongings bag he has a pair of khaki shorts and a green shirt. Pt has been wanded by security.

## 2016-09-02 NOTE — ED Provider Notes (Signed)
[facility]-EMERGENCY DEPT Provider Note   CSN: [id number] Arrival date & time: 09/02/16  4098     History   Chief Complaint Chief Complaint  Patient presents with  . Suicidal    HPI Dalton Molina is a 50 y.o. male.  HPI  50 year old male with a history of COPD, alcohol abuse, and depression presents with suicidal thoughts. Patient states he was discharged from [facility] today. He states he is not feeling any better but they still discharge him. He states he has a thought of trying to jump off a bridge to kill himself. This started today. The suicidal thoughts of overall been going on for the last couple weeks. He was detoxed from alcohol and states he drank alcohol shortly after discharge today. Felt a little dizzy after his morning medicines this morning but currently has no other complaints. Has not been ill recently.  Past Medical History:  Diagnosis Date  . Alcoholism /alcohol abuse (H[facility])   . COPD (chronic obstructive pulmonary disease) (H[facility])   . Depression   . GERD (gastroesophageal reflux disease)   . Osteoarthritis   . Scoliosis     Patient Active Problem List   Diagnosis Date Noted  . Bipolar 1 disorder, depressed, severe (H[facility]) 09/20/2015  . Alcohol dependence (H[facility]) 08/04/2013  . Anxiety state, unspecified 08/04/2013  . Suicidal thoughts 07/26/2013  . Depressive disorder 07/18/2013  . Alcohol abuse 07/18/2013  . S/P alcohol detoxification 07/18/2013    Past Surgical History:  Procedure Laterality Date  . APPENDECTOMY         Home Medications    Prior to Admission medications   Medication Sig Start Date End Date Taking? Authorizing Provider  albuterol (PROVENTIL HFA;VENTOLIN HFA) 108 (90 Base) MCG/ACT inhaler Inhale 2 puffs into the lungs every 6 (six) hours as needed for wheezing or shortness of breath.   Yes Historical Provider, MD  divalproex (DEPAKOTE) 500 MG DR tablet Take 500 mg by mouth 2 (two) times daily.   Yes Historical Provider, MD    FLUoxetine (PROZAC) 20 MG capsule Take 1 capsule (20 mg total) by mouth daily. For depression 08/06/13  Yes Sanjuana Kava, NP  gabapentin (NEURONTIN) 300 MG capsule Take 300 mg by mouth 3 (three) times daily. 09/02/16 11/01/16 Yes Historical Provider, MD  hydrOXYzine (VISTARIL) 50 MG capsule Take 50 mg by mouth daily.   Yes Historical Provider, MD  topiramate (TOPAMAX) 50 MG tablet Take 50 mg by mouth 2 (two) times daily. 09/02/16 11/01/16 Yes Historical Provider, MD  traZODone (DESYREL) 50 MG tablet Take 1 tablet (50 mg total) by mouth at bedtime. For sleep 08/06/13  Yes Sanjuana Kava, NP  cetirizine (ZYRTEC) 10 MG tablet Take 1 tablet (10 mg total) by mouth daily as needed for allergies. Patient not taking: Reported on 09/02/2016 08/06/13   Sanjuana Kava, NP    Family History Family History  Problem Relation Age of Onset  . Cancer Mother     bladder  . Cancer Father     mesothelioma    Social History Social History  Substance Use Topics  . Smoking status: Current Every Day Smoker    Packs/day: 1.00    Years: 15.00    Types: Cigarettes  . Smokeless tobacco: Never Used  . Alcohol use 50.4 oz/week    84 Cans of beer per week     Comment: reports is an alcoholic     Allergies   Percocet [oxycodone-acetaminophen]   Review of Systems Review of Systems  Constitutional: Negative for fever.  Respiratory: Negative for shortness of breath.   Cardiovascular: Negative for chest pain.  Gastrointestinal: Negative for vomiting.  Psychiatric/Behavioral: Positive for dysphoric mood, hallucinations (chronic, unchanged per patient) and suicidal ideas.  All other systems reviewed and are negative.    Physical Exam Updated Vital Signs BP 136/86 (BP Location: Left Arm)   Pulse 105   Temp 98 F (36.7 C) (Oral)   Resp 18   Ht 5\' 9"  (1.753 m)   Wt 250 lb (113.4 kg)   SpO2 93%   BMI 36.92 kg/m   Physical Exam  Constitutional: He is oriented to person, place, and time. He appears  well-developed and well-nourished. No distress.  Resting comfortably  HENT:  Head: Normocephalic and atraumatic.  Right Ear: External ear normal.  Left Ear: External ear normal.  Nose: Nose normal.  Eyes: Right eye exhibits no discharge. Left eye exhibits no discharge.  Neck: Neck supple.  Cardiovascular: Normal rate, regular rhythm and normal heart sounds.   Pulmonary/Chest: Effort normal and breath sounds normal.  Abdominal: Soft. There is no tenderness.  Musculoskeletal: He exhibits no edema.  Neurological: He is alert and oriented to person, place, and time.  Skin: Skin is warm and dry. He is not diaphoretic.  Psychiatric: His speech is not rapid and/or pressured, not delayed and not tangential. He is not agitated, not aggressive, not hyperactive and not actively hallucinating. He expresses suicidal ideation. He expresses suicidal plans.  Nursing note and vitals reviewed.    ED Treatments / Results  Labs (all labs ordered are listed, but only abnormal results are displayed) Labs Reviewed  COMPREHENSIVE METABOLIC PANEL - Abnormal; Notable for the following:       Result Value   CO2 21 (*)    Glucose, Bld 103 (*)    AST 47 (*)    ALT 75 (*)    All other components within normal limits  ACETAMINOPHEN LEVEL - Abnormal; Notable for the following:    Acetaminophen (Tylenol), Serum <10 (*)    All other components within normal limits  CBC - Abnormal; Notable for the following:    Hemoglobin 17.6 (*)    All other components within normal limits  RAPID URINE DRUG SCREEN, HOSP PERFORMED - Abnormal; Notable for the following:    Benzodiazepines POSITIVE (*)    All other components within normal limits  ETHANOL  SALICYLATE LEVEL    EKG  EKG Interpretation None       Radiology No results found.  Procedures Procedures (including critical care time)  Medications Ordered in ED Medications  thiamine (VITAMIN B-1) tablet 100 mg (100 mg Oral Given 09/02/16 2204)    Or    thiamine (B-1) injection 100 mg ( Intravenous See Alternative 09/02/16 2204)  LORazepam (ATIVAN) tablet 0-4 mg (not administered)    Followed by  LORazepam (ATIVAN) tablet 0-4 mg (not administered)  ibuprofen (ADVIL,MOTRIN) tablet 600 mg (not administered)  nicotine (NICODERM CQ - dosed in mg/24 hours) patch 21 mg (21 mg Transdermal Patch Applied 09/02/16 2206)  ondansetron (ZOFRAN) tablet 4 mg (not administered)  albuterol (PROVENTIL HFA;VENTOLIN HFA) 108 (90 Base) MCG/ACT inhaler 2 puff (not administered)  divalproex (DEPAKOTE) DR tablet 500 mg (500 mg Oral Given 09/02/16 2202)  FLUoxetine (PROZAC) capsule 20 mg (20 mg Oral Given 09/02/16 2203)  gabapentin (NEURONTIN) capsule 300 mg (300 mg Oral Given 09/02/16 2203)  hydrOXYzine (VISTARIL) capsule 50 mg (not administered)  topiramate (TOPAMAX) tablet 50 mg (50 mg Oral Given 09/02/16  2203)  traZODone (DESYREL) tablet 50 mg (50 mg Oral Given 09/02/16 2203)     Initial Impression / Assessment and Plan / ED Course  I have reviewed the triage vital signs and the nursing notes.  Pertinent labs & imaging results that were available during my care of the patient were reviewed by me and considered in my medical decision making (see chart for details).  Clinical Course  Comment By Time  Will consult TTS. Appears medically stable.  Pricilla Loveless, MD 10/16 2141    Currently pending TTS consult. Stable. Care to Dr. Judd Lien with TTS consult pending  Final Clinical Impressions(s) / ED Diagnoses   Final diagnoses:  Depression, unspecified depression type    New Prescriptions New Prescriptions   No medications on file     Pricilla Loveless, MD 09/02/16 2349

## 2016-09-02 NOTE — Progress Notes (Addendum)
Pt stated, "I would get drunk enough and plan to jump off the [place] [address]Last [facility] under [facility] written by Rodman Keyjanet [name], RN.

## 2016-09-02 NOTE — ED Triage Notes (Signed)
Got out of [facility] today and still felt suicidal. Pt is requesting to go to [facility] and wants his disability to be approved. He wants stability and , "want a roof over my head. I have struggled to keep a job for the last three years ."Pt admits to auditory hallucinations. He has had mental illness most of his childhood.

## 2016-09-03 ENCOUNTER — Observation Stay (HOSPITAL_COMMUNITY)
Admission: EM | Admit: 2016-09-03 | Discharge: 2016-09-05 | Disposition: A | Discharge status: Home / Self Care | Payer: Federal, State, Local not specified - Other | Source: Intra-hospital | Attending: Psychiatry | Admitting: Psychiatry

## 2016-09-03 ENCOUNTER — Encounter (HOSPITAL_COMMUNITY): Payer: Self-pay

## 2016-09-03 DIAGNOSIS — F1014 Alcohol abuse with alcohol-induced mood disorder: Secondary | ICD-10-CM | POA: Diagnosis present

## 2016-09-03 DIAGNOSIS — Z915 Personal history of self-harm: Secondary | ICD-10-CM | POA: Insufficient documentation

## 2016-09-03 DIAGNOSIS — J449 Chronic obstructive pulmonary disease, unspecified: Secondary | ICD-10-CM | POA: Insufficient documentation

## 2016-09-03 DIAGNOSIS — F17210 Nicotine dependence, cigarettes, uncomplicated: Secondary | ICD-10-CM | POA: Insufficient documentation

## 2016-09-03 DIAGNOSIS — F32A Depression, unspecified: Secondary | ICD-10-CM | POA: Diagnosis present

## 2016-09-03 DIAGNOSIS — F419 Anxiety disorder, unspecified: Secondary | ICD-10-CM | POA: Insufficient documentation

## 2016-09-03 DIAGNOSIS — F1024 Alcohol dependence with alcohol-induced mood disorder: Principal | ICD-10-CM | POA: Insufficient documentation

## 2016-09-03 DIAGNOSIS — F329 Major depressive disorder, single episode, unspecified: Secondary | ICD-10-CM | POA: Insufficient documentation

## 2016-09-03 DIAGNOSIS — Z590 Homelessness: Secondary | ICD-10-CM | POA: Insufficient documentation

## 2016-09-03 MED ORDER — MAGNESIUM HYDROXIDE 400 MG/5ML PO SUSP: 30.0000 mL | Freq: Every day | ORAL | Status: DC | PRN

## 2016-09-03 MED ORDER — IBUPROFEN 600 MG PO TABS: 600.0000 mg | ORAL_TABLET | Freq: Three times a day (TID) | ORAL | Status: DC | PRN

## 2016-09-03 MED ORDER — NICOTINE 21 MG/24HR TD PT24
21.0000 mg | MEDICATED_PATCH | Freq: Every day | TRANSDERMAL | Status: DC
  Administered 2016-09-05: 21 mg via TRANSDERMAL
  Filled 2016-09-03: qty 1

## 2016-09-03 MED ORDER — ONDANSETRON HCL 4 MG PO TABS: 4.0000 mg | ORAL_TABLET | Freq: Three times a day (TID) | ORAL | Status: DC | PRN

## 2016-09-03 MED ORDER — LORAZEPAM 1 MG PO TABS
0.0000 mg | ORAL_TABLET | Freq: Two times a day (BID) | ORAL | Status: DC
  Filled 2016-09-03: qty 1

## 2016-09-03 MED ORDER — DIVALPROEX SODIUM 500 MG PO DR TAB
500.0000 mg | DELAYED_RELEASE_TABLET | Freq: Two times a day (BID) | ORAL | Status: DC
  Administered 2016-09-03 – 2016-09-05 (×4): 500 mg via ORAL
  Filled 2016-09-03: qty 1
  Filled 2016-09-03: qty 14
  Filled 2016-09-03 (×3): qty 1

## 2016-09-03 MED ORDER — TRAZODONE HCL 50 MG PO TABS
50.0000 mg | ORAL_TABLET | Freq: Every day | ORAL | Status: DC
  Administered 2016-09-03 – 2016-09-04 (×2): 50 mg via ORAL
  Filled 2016-09-03 (×2): qty 1

## 2016-09-03 MED ORDER — TOPIRAMATE 100 MG PO TABS
50.0000 mg | ORAL_TABLET | Freq: Two times a day (BID) | ORAL | Status: DC
  Administered 2016-09-03 – 2016-09-05 (×4): 50 mg via ORAL
  Filled 2016-09-03 (×3): qty 2
  Filled 2016-09-03: qty 7
  Filled 2016-09-03: qty 2

## 2016-09-03 MED ORDER — LORAZEPAM 1 MG PO TABS
0.0000 mg | ORAL_TABLET | Freq: Four times a day (QID) | ORAL | Status: AC
  Administered 2016-09-03: 1 mg via ORAL
  Filled 2016-09-03: qty 1

## 2016-09-03 MED ORDER — GABAPENTIN 300 MG PO CAPS
300.0000 mg | ORAL_CAPSULE | Freq: Three times a day (TID) | ORAL | Status: DC
  Administered 2016-09-03 – 2016-09-05 (×7): 300 mg via ORAL
  Filled 2016-09-03: qty 3
  Filled 2016-09-03 (×5): qty 1
  Filled 2016-09-03: qty 21
  Filled 2016-09-03: qty 1

## 2016-09-03 MED ORDER — FLUOXETINE HCL 20 MG PO CAPS
20.0000 mg | ORAL_CAPSULE | Freq: Every day | ORAL | Status: DC
  Administered 2016-09-04 – 2016-09-05 (×2): 20 mg via ORAL
  Filled 2016-09-03: qty 7
  Filled 2016-09-03 (×2): qty 1

## 2016-09-03 MED ORDER — ALUM & MAG HYDROXIDE-SIMETH 200-200-20 MG/5ML PO SUSP: 30.0000 mL | ORAL | Status: DC | PRN

## 2016-09-03 MED ORDER — VITAMIN B-1 100 MG PO TABS
100.0000 mg | ORAL_TABLET | Freq: Every day | ORAL | Status: DC
  Administered 2016-09-04 – 2016-09-05 (×2): 100 mg via ORAL
  Filled 2016-09-03 (×2): qty 1

## 2016-09-03 MED ORDER — ALBUTEROL SULFATE HFA 108 (90 BASE) MCG/ACT IN AERS: 2.0000 | INHALATION_SPRAY | Freq: Four times a day (QID) | RESPIRATORY_TRACT | Status: DC | PRN

## 2016-09-03 MED ORDER — HYDROXYZINE HCL 50 MG PO TABS
50.0000 mg | ORAL_TABLET | Freq: Every day | ORAL | Status: DC
  Administered 2016-09-04 – 2016-09-05 (×2): 50 mg via ORAL
  Filled 2016-09-03 (×2): qty 1

## 2016-09-03 MED ORDER — THIAMINE HCL 100 MG/ML IJ SOLN: 100.0000 mg | Freq: Every day | INTRAMUSCULAR | Status: DC

## 2016-09-03 NOTE — ED Notes (Signed)
Spoke with TTS, [facility] so I could give report.  [facility] out patient and is waiting a bed assignment at [facility].

## 2016-09-03 NOTE — ED Notes (Signed)
Dalton Molina with TTS at bedside 

## 2016-09-03 NOTE — Progress Notes (Signed)
Patient stated that [facility]. He told this Clinical research associatewriter that he was recently hospitalized there back in March and had good results with the treatment he received there. Patient stated that he got a lot out of the group sessions and felt like he benefited from the team interaction there. Patient also stated that he currently is taking his medications as prescribed but believes that he would benefit from inpatient care.
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
## 2016-09-03 NOTE — BH Assessment (Signed)
[facility] Assessment Progress Note  Per Nanine MeansJamison Lord, DNP, this pt [facility].  Lillia AbedLindsay, RN, [facility].  Pt has signed Voluntary Admission and Consent for Treatment, as well as Consent to Release Information to his attorney, and signed forms have [facility].  Pt's nurse has been notified, and agrees to send original paperwork along with pt via [facility], and to call report to [phone] or [phone].  Doylene Canninghomas Jakayden Cancio, MA Triage Specialist [phone]

## 2016-09-03 NOTE — ED Notes (Signed)
[facility]

## 2016-09-03 NOTE — Progress Notes (Signed)
Admission Note: [facility], a 50 y/[facility] male[facility]  He reportedly presented to ED after drinking alcohol and having suicidal ideation.  He continues to endorse passive suicidal ideation with no plan.  He verbally contracts for safety.  Patient reports that [facility].  Patient denies homicidal ideation and AVH; He says that he previously heard command hallucinations telling him that he was "no good" and to hurt himself but he denies this at present time.  He reportedly drinks approximately 12 beers daily.  He denies illicit drug use.  Patient reports having history of sleep apnea and frequently fell asleep sitting up during nursing assessment.  He denies use of CPAP.  Patient oriented to unit and plan of care.  Patient and belongings checked and skin assessment done and witnessed by two staff members.  Belongings locked up by Security.  He also has a green suitcase and a red/black storage contained that was placed on shelf in search room.  Emotional support provided; encourage him to seek assistance with needs/concerns.

## 2016-09-03 NOTE — Progress Notes (Signed)
Pt is on unit and in bed watching TV.  Pt is asking is staff can get him some pie.  Pt contracts for safety verbally and continues to indorse passive SI.  Pt denies HI, AVH.  Pt denies pain and discomfort.  Pt has slow speech and slow movements with periods of drowsiness.  Pt is taking shower and is watched periodically for safety.   Pt given ice cream and drinks.  Pt given support and encouragement. Patient continuously observed for safety while on the unit except when in the bathroom.   Pt remains safe.

## 2016-09-03 NOTE — BH Assessment (Signed)
Assessor spoke with Raiford Nobleick, RN who stated he will move the pt to a room and set up the cart to complete the TTS assessment. Requested 10 mins to prepare the pt.  Princess BruinsAquicha Duff, MSW, Theresia MajorsLCSWA

## 2016-09-03 NOTE — Consult Note (Addendum)
[place] [facility] Consult   Reason for Consult:  Alcohol abuse with suicidal ideations Referring Physician:  EDP Patient Identification: Dalton Molina MRN:  [id number] Principal Diagnosis: Alcohol abuse with alcohol-induced mood disorder [facility]) Diagnosis:   Patient Active Problem List   Diagnosis Date Noted  . Alcohol abuse with alcohol-induced mood disorder ([place]) [F10.14] 09/03/2016    Priority: High  . Alcohol dependence ([place]) [F10.20] 08/04/2013  . Anxiety state, unspecified [F41.1] 08/04/2013  . Suicidal thoughts [R45.851] 07/26/2013  . Depressive disorder [F32.9] 07/18/2013  . Alcohol abuse [F10.10] 07/18/2013  . S/P alcohol detoxification [Z09] 07/18/2013    Total Time spent with patient: 45 minutes  Subjective:   Dalton Molina is a 50 y.o. male patient admitted to [facility] Obs.  HPI:  50 yo male who presented to the ED after drinking alcohol and having suicidal ideations.  He has no plan and was discharged from [facility] yesterday.  Dalton Molina started drinking alcohol after discharge and came to this ED.  No withdrawal symptoms or homicidal ideations or hallucinations.   Past Psychiatric History: alcohol and depression  Risk to Self: Suicidal Ideation: Yes-Currently Present Suicidal Intent: Yes-Currently Present Is patient at risk for suicide?: Yes Suicidal Plan?: Yes-Currently Present Specify Current Suicidal Plan: pt has plan to jump off of a bridge Access to Means: Yes Specify Access to Suicidal Means: pt has access to bridges What has been your use of drugs/alcohol within the last 12 months?: reports to daily alcohol use How many times?: 5 Triggers for Past Attempts: Unknown (depression) Intentional Self Injurious Behavior: Cutting Comment - Self Injurious Behavior: pt reports he last cut himself last Thursday Risk to Others: Homicidal Ideation: No Thoughts of Harm to Others: No Current Homicidal Intent: No Current Homicidal Plan: No Access to Homicidal  Means: No History of harm to others?: No Assessment of Violence: None Noted Does patient have access to weapons?: No Criminal Charges Pending?: No Does patient have a court date: No Prior Inpatient Therapy: Prior Inpatient Therapy: Yes Prior Therapy Dates: multiple Prior Therapy Facilty/Provider(s): multiple, [facility], [facility], [facility] Reason for Treatment: depression, suicidal thoughts Prior Outpatient Therapy: Prior Outpatient Therapy: Yes Prior Therapy Dates: 2017 Prior Therapy Facilty/Provider(s): [facility] Reason for Treatment: depression Does patient have an ACCT team?: No Does patient have Intensive In-House Services?  : No Does patient have [facility] services? : No Does patient have [facility] services?: No  Past Medical History:  Past Medical History:  Diagnosis Date  . Alcoholism /alcohol abuse ([place])   . COPD (chronic obstructive pulmonary disease) ([place])   . Depression   . GERD (gastroesophageal reflux disease)   . Osteoarthritis   . Scoliosis     Past Surgical History:  Procedure Laterality Date  . APPENDECTOMY     Family History:  Family History  Problem Relation Age of Onset  . Cancer Mother     bladder  . Cancer Father     mesothelioma   Family Psychiatric  History: none Social History:  History  Alcohol Use  . 50.4 oz/week  . 84 Cans of beer per week    Comment: reports is an alcoholic     History  Drug Use No    Social History   Social History  . Marital status: Single    Spouse name: N/A  . Number of children: N/A  . Years of education: N/A   Social History Main Topics  . Smoking status: Current Every Day Smoker    Packs/day: 1.00  Years: 15.00    Types: Cigarettes  . Smokeless tobacco: Never Used  . Alcohol use 50.4 oz/week    84 Cans of beer per week     Comment: reports is an alcoholic  . Drug use: No  . Sexual activity: Yes   Other Topics Concern  . None   Social History Narrative  . None   Additional Social  History:    Allergies:   Allergies  Allergen Reactions  . Percocet [Oxycodone-Acetaminophen] Anaphylaxis and Rash    Labs:  Results for orders placed or performed during the hospital encounter of 09/02/16 (from the past 48 hour(s))  Comprehensive metabolic panel     Status: Abnormal   Collection Time: 09/02/16  8:00 PM  Result Value Ref Range   Sodium 140 135 - 145 mmol/L   Potassium 3.7 3.5 - 5.1 mmol/L   Chloride 111 101 - 111 mmol/L   CO2 21 (L) 22 - 32 mmol/L   Glucose, Bld 103 (H) 65 - 99 mg/dL   BUN 10 6 - 20 mg/dL   Creatinine, Ser 0.97 0.61 - 1.24 mg/dL   Calcium 9.2 8.9 - 10.3 mg/dL   Total Protein 6.9 6.5 - 8.1 g/dL   Albumin 3.9 3.5 - 5.0 g/dL   AST 47 (H) 15 - 41 U/L   ALT 75 (H) 17 - 63 U/L   Alkaline Phosphatase 60 38 - 126 U/L   Total Bilirubin 0.8 0.3 - 1.2 mg/dL   GFR calc non Af Amer >60 >60 mL/min   GFR calc Af Amer >60 >60 mL/min    Comment: (NOTE) The eGFR has been calculated using the CKD EPI equation. This calculation has not been validated in all clinical situations. eGFR's persistently <60 mL/min signify possible Chronic Kidney Disease.    Anion gap 8 5 - 15  Ethanol     Status: None   Collection Time: 09/02/16  8:00 PM  Result Value Ref Range   Alcohol, Ethyl (B) <5 <5 mg/dL    Comment:        LOWEST DETECTABLE LIMIT FOR SERUM ALCOHOL IS 5 mg/dL FOR MEDICAL PURPOSES ONLY   Salicylate level     Status: None   Collection Time: 09/02/16  8:00 PM  Result Value Ref Range   Salicylate Lvl <5.9 2.8 - 30.0 mg/dL  Acetaminophen level     Status: Abnormal   Collection Time: 09/02/16  8:00 PM  Result Value Ref Range   Acetaminophen (Tylenol), Serum <10 (L) 10 - 30 ug/mL    Comment:        THERAPEUTIC CONCENTRATIONS VARY SIGNIFICANTLY. A RANGE OF 10-30 ug/mL MAY BE AN EFFECTIVE CONCENTRATION FOR MANY PATIENTS. HOWEVER, SOME ARE BEST TREATED AT CONCENTRATIONS OUTSIDE THIS RANGE. ACETAMINOPHEN CONCENTRATIONS >150 ug/mL AT 4 HOURS  AFTER INGESTION AND >50 ug/mL AT 12 HOURS AFTER INGESTION ARE OFTEN ASSOCIATED WITH TOXIC REACTIONS.   cbc     Status: Abnormal   Collection Time: 09/02/16  8:00 PM  Result Value Ref Range   WBC 8.7 4.0 - 10.5 K/uL   RBC 5.20 4.22 - 5.81 MIL/uL   Hemoglobin 17.6 (H) 13.0 - 17.0 g/dL   HCT 49.1 39.0 - 52.0 %   MCV 94.4 78.0 - 100.0 fL   MCH 33.8 26.0 - 34.0 pg   MCHC 35.8 30.0 - 36.0 g/dL   RDW 13.5 11.5 - 15.5 %   Platelets 214 150 - 400 K/uL  Rapid urine drug screen (hospital performed)  Status: Abnormal   Collection Time: 09/02/16  8:18 PM  Result Value Ref Range   Opiates NONE DETECTED NONE DETECTED   Cocaine NONE DETECTED NONE DETECTED   Benzodiazepines POSITIVE (A) NONE DETECTED   Amphetamines NONE DETECTED NONE DETECTED   Tetrahydrocannabinol NONE DETECTED NONE DETECTED   Barbiturates NONE DETECTED NONE DETECTED    Comment:        DRUG SCREEN FOR MEDICAL PURPOSES ONLY.  IF CONFIRMATION IS NEEDED FOR ANY PURPOSE, NOTIFY LAB WITHIN 5 DAYS.        LOWEST DETECTABLE LIMITS FOR URINE DRUG SCREEN Drug Class       Cutoff (ng/mL) Amphetamine      1000 Barbiturate      200 Benzodiazepine   161 Tricyclics       096 Opiates          300 Cocaine          300 THC              50     Current Facility-Administered Medications  Medication Dose Route Frequency Provider Last Rate Last Dose  . albuterol (PROVENTIL HFA;VENTOLIN HFA) 108 (90 Base) MCG/ACT inhaler 2 puff  2 puff Inhalation Q6H PRN Sherwood Gambler, MD   2 puff at 09/03/16 0659  . divalproex (DEPAKOTE) DR tablet 500 mg  500 mg Oral BID Sherwood Gambler, MD   500 mg at 09/02/16 2202  . FLUoxetine (PROZAC) capsule 20 mg  20 mg Oral Daily Sherwood Gambler, MD   20 mg at 09/02/16 2203  . gabapentin (NEURONTIN) capsule 300 mg  300 mg Oral TID Sherwood Gambler, MD   300 mg at 09/02/16 2203  . hydrOXYzine (ATARAX/VISTARIL) tablet 50 mg  50 mg Oral Daily Sherwood Gambler, MD      . ibuprofen (ADVIL,MOTRIN) tablet 600 mg  600 mg  Oral Q8H PRN Sherwood Gambler, MD      . LORazepam (ATIVAN) tablet 0-4 mg  0-4 mg Oral Q6H Sherwood Gambler, MD   1 mg at 09/03/16 0659   Followed by  . [START ON 09/05/2016] LORazepam (ATIVAN) tablet 0-4 mg  0-4 mg Oral Q12H Sherwood Gambler, MD      . nicotine (NICODERM CQ - dosed in mg/24 hours) patch 21 mg  21 mg Transdermal Daily Sherwood Gambler, MD   21 mg at 09/02/16 2206  . ondansetron (ZOFRAN) tablet 4 mg  4 mg Oral Q8H PRN Sherwood Gambler, MD      . thiamine (VITAMIN B-1) tablet 100 mg  100 mg Oral Daily Sherwood Gambler, MD   100 mg at 09/02/16 2204   Or  . thiamine (B-1) injection 100 mg  100 mg Intravenous Daily Sherwood Gambler, MD      . topiramate (TOPAMAX) tablet 50 mg  50 mg Oral BID Sherwood Gambler, MD   50 mg at 09/02/16 2203  . traZODone (DESYREL) tablet 50 mg  50 mg Oral QHS Sherwood Gambler, MD   50 mg at 09/02/16 2203   Current Outpatient Prescriptions  Medication Sig Dispense Refill  . albuterol (PROVENTIL HFA;VENTOLIN HFA) 108 (90 Base) MCG/ACT inhaler Inhale 2 puffs into the lungs every 6 (six) hours as needed for wheezing or shortness of breath.    . divalproex (DEPAKOTE) 500 MG DR tablet Take 500 mg by mouth 2 (two) times daily.    Marland Kitchen FLUoxetine (PROZAC) 20 MG capsule Take 1 capsule (20 mg total) by mouth daily. For depression 30 capsule 0  . gabapentin (NEURONTIN) 300 MG capsule  Take 300 mg by mouth 3 (three) times daily.    . hydrOXYzine (VISTARIL) 50 MG capsule Take 50 mg by mouth daily.    Marland Kitchen topiramate (TOPAMAX) 50 MG tablet Take 50 mg by mouth 2 (two) times daily.    . traZODone (DESYREL) 50 MG tablet Take 1 tablet (50 mg total) by mouth at bedtime. For sleep 30 tablet 0  . cetirizine (ZYRTEC) 10 MG tablet Take 1 tablet (10 mg total) by mouth daily as needed for allergies. (Patient not taking: Reported on 09/02/2016)      Musculoskeletal: Strength & Muscle Tone: within normal limits Gait & Station: normal Patient leans: N/A  Psychiatric Specialty Exam: Physical Exam   Constitutional: He is oriented to person, place, and time. He appears well-developed and well-nourished.  HENT:  Head: Normocephalic.  Neck: Normal range of motion.  Respiratory: Effort normal.  Musculoskeletal: Normal range of motion.  Neurological: He is alert and oriented to person, place, and time.  Skin: Skin is warm and dry.  Psychiatric: His speech is normal and behavior is normal. Judgment and thought content normal. Cognition and memory are normal. He exhibits a depressed mood.    Review of Systems  Constitutional: Negative.   HENT: Negative.   Eyes: Negative.   Respiratory: Negative.   Cardiovascular: Negative.   Gastrointestinal: Negative.   Genitourinary: Negative.   Musculoskeletal: Negative.   Skin: Negative.   Neurological: Negative.   Endo/Heme/Allergies: Negative.   Psychiatric/Behavioral: Positive for depression and substance abuse.    Blood pressure 113/78, pulse 81, temperature 98 F (36.7 C), temperature source Oral, resp. rate 12, height '5\' 9"'$  (1.753 m), weight 113.4 kg (250 lb), SpO2 96 %.Body mass index is 36.92 kg/m.  General Appearance: Disheveled  Eye Contact:  Fair  Speech:  Normal Rate  Volume:  Decreased  Mood:  Depressed  Affect:  Congruent  Thought Process:  Coherent and Descriptions of Associations: Intact  Orientation:  Full (Time, Place, and Person)  Thought Content:  Rumination  Suicidal Thoughts:  Yes.  without intent/plan  Homicidal Thoughts:  No  Memory:  Immediate;   Fair Recent;   Fair Remote;   Fair  Judgement:  Fair  Insight:  Fair  Psychomotor Activity:  Decreased  Concentration:  Concentration: Fair and Attention Span: Fair  Recall:  [employer] of Knowledge:  Fair  Language:  Good  Akathisia:  No  Handed:  Right  AIMS (if indicated):     Assets:  Leisure Time Physical Health Resilience  ADL's:  Intact  Cognition:  WNL  Sleep:        Treatment Plan Summary: Daily contact with patient to assess and evaluate  symptoms and progress in treatment, Medication management and Plan alcohol abuse with alcohol induced mood disorder:  -Crisis stabilization -Medication management:  Ativan alcohol detox protocol started along with his gabapentin 300 mg TID for withdrawal symptoms, Depakote 500 mg BID for mood stabilization, Trazodone 50 mg at bedtime for sleep, Prozac 20 mg daily for depression, and Topamax 50 mg daily for mood and headaches. -Individual and substance abuse counseling -Transfer to [facility] Obs  Disposition: Supportive therapy provided about ongoing stressors.  Waylan Boga, NP 09/03/2016 10:06 AM  Patient seen face-to-face for psychiatric evaluation, chart reviewed and case discussed with the physician extender and developed treatment plan. Reviewed the information documented and agree with the treatment plan. Corena Pilgrim, MD

## 2016-09-04 DIAGNOSIS — F17210 Nicotine dependence, cigarettes, uncomplicated: Secondary | ICD-10-CM

## 2016-09-04 DIAGNOSIS — F1014 Alcohol abuse with alcohol-induced mood disorder: Secondary | ICD-10-CM

## 2016-09-04 DIAGNOSIS — Z79899 Other long term (current) drug therapy: Secondary | ICD-10-CM

## 2016-09-04 DIAGNOSIS — F329 Major depressive disorder, single episode, unspecified: Secondary | ICD-10-CM | POA: Diagnosis not present

## 2016-09-04 DIAGNOSIS — Z808 Family history of malignant neoplasm of other organs or systems: Secondary | ICD-10-CM

## 2016-09-04 NOTE — Progress Notes (Signed)
[facility]  09/04/2016 12:37 PM Richelle ItoGuy Lunden  MRN:  [id number] Subjective:   Principal Problem: Alcohol abuse with alcohol-induced mood disorder [facility](HCC)  Diagnosis:   Patient Active Problem List   Diagnosis Date Noted  . Alcohol abuse with alcohol-induced mood disorder ([facility]) [F10.14] 09/03/2016  . Alcohol dependence ([facility]) [F10.20] 08/04/2013  . Anxiety state, unspecified [F41.1] 08/04/2013  . Suicidal thoughts [R45.851] 07/26/2013  . Depressive disorder [F32.9] 07/18/2013  . Alcohol abuse [F10.10] 07/18/2013  . S/P alcohol detoxification [Z09] 07/18/2013   Total Time spent with patient: 15 minutes  Past Psychiatric History: Alcohol dependence, Anxiety, Depression, Suicidal thoughts  Past Medical History:  Past Medical History:  Diagnosis Date  . Alcoholism /alcohol abuse ([facility])   . COPD (chronic obstructive pulmonary disease) ([facility])   . Depression   . GERD (gastroesophageal reflux disease)   . Osteoarthritis   . Scoliosis     Past Surgical History:  Procedure Laterality Date  . APPENDECTOMY     Family History:  Family History  Problem Relation Age of Onset  . Cancer Mother     bladder  . Cancer Father     mesothelioma   Family Psychiatric  History: unknown Social History:  History  Alcohol Use  . 50.4 oz/week  . 84 Cans of beer per week    Comment: reports is an alcoholic     History  Drug Use No    Comment: Denies use    Social History   Social History  . Marital status: Single    Spouse name: N/A  . Number of children: N/A  . Years of education: N/A   Social History Main Topics  . Smoking status: Current Every Day Smoker    Packs/day: 1.00    Years: 15.00    Types: Cigarettes  . Smokeless tobacco: Never Used     Comment: Refused cessation materials  . Alcohol use 50.4 oz/week    84 Cans of beer per week     Comment: reports is an alcoholic  . Drug use: No     Comment: Denies use  . Sexual activity: Yes   Other Topics  Concern  . None   Social History Narrative  . None   Additional Social History:    Pain Medications: Pt denies abuse Prescriptions: pt denies abuse Over the Counter: pt denies abuse  History of alcohol / drug use?: Yes Longest period of sobriety (when/how long): 1 year Negative Consequences of Use: Financial, Legal, Personal relationships, Work / School Withdrawal Symptoms: Irritability Name of Substance 1: Alcohol 1 - Age of First Use: 14 1 - Amount (size/oz): 4 beers 1 - Frequency: daily 1 - Duration: years 1 - Last Use / Amount: yesterday    Sleep: Poor  Appetite:  Good  Current Medications: Current Facility-Administered Medications  Medication Dose Route Frequency Provider Last Rate Last Dose  . albuterol (PROVENTIL HFA;VENTOLIN HFA) 108 (90 Base) MCG/ACT inhaler 2 puff  2 puff Inhalation Q6H PRN Charm RingsJamison Y Lord, NP      . alum & mag hydroxide-simeth (MAALOX/MYLANTA) 200-200-20 MG/5ML suspension 30 mL  30 mL Oral Q4H PRN Charm RingsJamison Y Lord, NP      . divalproex (DEPAKOTE) DR tablet 500 mg  500 mg Oral BID Charm RingsJamison Y Lord, NP   500 mg at 09/04/16 0733  . FLUoxetine (PROZAC) capsule 20 mg  20 mg Oral Daily Charm RingsJamison Y Lord, NP   20 mg at 09/04/16 0733  . gabapentin (NEURONTIN) capsule 300  mg  300 mg Oral TID Charm Rings, NP   300 mg at 09/04/16 1120  . hydrOXYzine (ATARAX/VISTARIL) tablet 50 mg  50 mg Oral Daily Charm Rings, NP   50 mg at 09/04/16 0733  . ibuprofen (ADVIL,MOTRIN) tablet 600 mg  600 mg Oral Q8H PRN Charm Rings, NP      . LORazepam (ATIVAN) tablet 0-4 mg  0-4 mg Oral Q6H Charm Rings, NP   1 mg at 09/03/16 1335   Followed by  . [START ON 09/05/2016] LORazepam (ATIVAN) tablet 0-4 mg  0-4 mg Oral Q12H Charm Rings, NP      . magnesium hydroxide (MILK OF MAGNESIA) suspension 30 mL  30 mL Oral Daily PRN Charm Rings, NP      . nicotine (NICODERM CQ - dosed in mg/24 hours) patch 21 mg  21 mg Transdermal Daily Charm Rings, NP      . ondansetron [facility])  tablet 4 mg  4 mg Oral Q8H PRN Charm Rings, NP      . thiamine (VITAMIN B-1) tablet 100 mg  100 mg Oral Daily Charm Rings, NP   100 mg at 09/04/16 1610   Or  . thiamine (B-1) injection 100 mg  100 mg Intravenous Daily Charm Rings, NP      . topiramate (TOPAMAX) tablet 50 mg  50 mg Oral BID Charm Rings, NP   50 mg at 09/04/16 0733  . traZODone (DESYREL) tablet 50 mg  50 mg Oral QHS Charm Rings, NP   50 mg at 09/03/16 2154   Today: Pt was extremely sleepy, snoring loudly, oriented x 2 (place and person), denies homicidal ideations, denies auditory/visual hallucinations and does not appear to be responding to external stimuli. Pt expressed thoughts of suicide but does not have a plan. Per report from Rn, pt is extremely unsteady on his feet and needs assist to get up to the bathroom. RN reports that pt is sleeping all the time but has what appears to be extreme sleep apnea. Pt is not stable for discharge today from observation unit. Pt arrived in obs on 09/03/17 in the late evening. Will hold one more day to reassess stability for discharge.    Musculoskeletal: Strength & Muscle Tone: decreased Gait & Station: unsteady Patient leans: N/A  Psychiatric Specialty Exam: Physical Exam  Skin: Skin is warm and dry.    Review of Systems  Psychiatric/Behavioral: Positive for depression, substance abuse and suicidal ideas (without plan). Negative for hallucinations and memory loss. The patient is not nervous/anxious and does not have insomnia.   All other systems reviewed and are negative.   Blood pressure 135/69, pulse 62, temperature 98.5 F (36.9 C), temperature source Oral, resp. rate 16, height 5\' 9"  (1.753 m), weight 114.3 kg (252 lb), SpO2 98 %.Body mass index is 37.21 kg/m.  General Appearance: Disheveled  Eye Contact:  Poor  Speech:  Garbled and Slow  Volume:  Decreased  Mood:  pt was asleep through most of assessment  Affect:  Depressed  Thought Process:  Disorganized   Orientation:  Other:  place and person  Thought Content:  Illogical  Suicidal Thoughts:  Yes.  without intent/plan  Homicidal Thoughts:  No  Memory:  Immediate;   Fair Recent;   Fair Remote;   Fair  Judgement:  Poor  Insight:  Fair  Psychomotor Activity:  Decreased  Concentration:  Concentration: Poor and Attention Span: Poor  Recall:  Poor  Fund of Knowledge:  Fair  Language:  Fair  Akathisia:  No  Handed:  Right  AIMS (if indicated):     Assets:  Communication Skills Desire for Improvement  ADL's:  Intact  Cognition:  WNL  Sleep:        Treatment Plan Summary: Maintain daily contact with patient to assess for safety and progress  Medication Management:  Depakote 500 mg BID Prozac 20 mg QD Gabapentin 300 TID Vistaril 50 mg QD Trazadone 50 mg QHS Topamax 50 mg QHS  Reassess in AM for discharge from OBS    Laveda Abbe, NP 09/04/2016, 12:37 PM

## 2016-09-04 NOTE — Progress Notes (Signed)
Patient has been "snoring" all morning;  Night staff reported that he "snored" all night.  During conversations he suddenly and frequently falls asleep.  Gait unsteady and unbalanced. Remains on fall precations.

## 2016-09-04 NOTE — Progress Notes (Signed)
[facility] OBSERVATION UNIT:  Family/Significant Other Suicide Prevention Education  Suicide Prevention Education:  Patient Refusal for Family/Significant Other Suicide Prevention Education: The patient Richelle ItoGuy Chamberlain has refused to provide written consent for family/significant other to be provided Family/Significant Other Suicide Prevention Education during admission and/or prior to discharge.  Physician notified.  Camelia EngKaren H Ziomara Birenbaum 09/04/2016, 9:41 AM   Camelia EngKaren H Ladarious Kresse, RN 09/04/16  9:41 AM

## 2016-09-04 NOTE — [facility] Counselor (Signed)
This Clinical research associatewriter called [facility] and spoke with [name] who communicated w/writer that male beds are available. This Clinical research associatewriter communicated this information to Pt who was receptive of this information and provided this Clinical research associatewriter with written and verbal consent to submit his supporting documentation to [facility] for potential pt placement.

## 2016-09-04 NOTE — [facility] Counselor (Signed)
This pt was denied at [facility] due to acuity per Molly Maduroobert.

## 2016-09-04 NOTE — Progress Notes (Signed)
D:  Patient calm and cooperative; he reports passive suicidal ideation with no plan;  Verbally contracts for safety.  No self-injurious behaviors noted or reported; he denies homicidal ideation and AVH A:  Medications given as scheduled; he refused Nicotine Patch.  Emotional support provided; encouraged him to seek assistance with needs/concerns R: Safety maintained on unit

## 2016-09-05 ENCOUNTER — Emergency Department (HOSPITAL_COMMUNITY)
Admission: EM | Admit: 2016-09-05 | Discharge: 2016-09-06 | Disposition: A | Discharge status: Home / Self Care | Payer: Self-pay | Attending: Emergency Medicine | Admitting: Emergency Medicine

## 2016-09-05 ENCOUNTER — Encounter (HOSPITAL_COMMUNITY): Payer: Self-pay

## 2016-09-05 DIAGNOSIS — Z8052 Family history of malignant neoplasm of bladder: Secondary | ICD-10-CM | POA: Diagnosis not present

## 2016-09-05 DIAGNOSIS — S60812A Abrasion of left wrist, initial encounter: Secondary | ICD-10-CM | POA: Insufficient documentation

## 2016-09-05 DIAGNOSIS — Z888 Allergy status to other drugs, medicaments and biological substances status: Secondary | ICD-10-CM

## 2016-09-05 DIAGNOSIS — Z79899 Other long term (current) drug therapy: Secondary | ICD-10-CM | POA: Insufficient documentation

## 2016-09-05 DIAGNOSIS — Z7289 Other problems related to lifestyle: Secondary | ICD-10-CM

## 2016-09-05 DIAGNOSIS — Y999 Unspecified external cause status: Secondary | ICD-10-CM | POA: Insufficient documentation

## 2016-09-05 DIAGNOSIS — F17210 Nicotine dependence, cigarettes, uncomplicated: Secondary | ICD-10-CM | POA: Insufficient documentation

## 2016-09-05 DIAGNOSIS — F329 Major depressive disorder, single episode, unspecified: Secondary | ICD-10-CM | POA: Diagnosis not present

## 2016-09-05 DIAGNOSIS — Y9389 Activity, other specified: Secondary | ICD-10-CM | POA: Insufficient documentation

## 2016-09-05 DIAGNOSIS — J449 Chronic obstructive pulmonary disease, unspecified: Secondary | ICD-10-CM | POA: Insufficient documentation

## 2016-09-05 DIAGNOSIS — R45851 Suicidal ideations: Secondary | ICD-10-CM

## 2016-09-05 DIAGNOSIS — IMO0002 Reserved for concepts with insufficient information to code with codable children: Secondary | ICD-10-CM

## 2016-09-05 DIAGNOSIS — X788XXA Intentional self-harm by other sharp object, initial encounter: Secondary | ICD-10-CM | POA: Insufficient documentation

## 2016-09-05 DIAGNOSIS — Y9289 Other specified places as the place of occurrence of the external cause: Secondary | ICD-10-CM | POA: Insufficient documentation

## 2016-09-05 DIAGNOSIS — F1014 Alcohol abuse with alcohol-induced mood disorder: Secondary | ICD-10-CM | POA: Diagnosis not present

## 2016-09-05 DIAGNOSIS — F339 Major depressive disorder, recurrent, unspecified: Secondary | ICD-10-CM | POA: Insufficient documentation

## 2016-09-05 DIAGNOSIS — Z808 Family history of malignant neoplasm of other organs or systems: Secondary | ICD-10-CM | POA: Diagnosis not present

## 2016-09-05 LAB — CBC
HCT: 51.5 % (ref 39.0–52.0)
Hemoglobin: 18.8 g/dL — ABNORMAL HIGH (ref 13.0–17.0)
MCH: 33.9 pg (ref 26.0–34.0)
MCHC: 36.5 g/dL — ABNORMAL HIGH (ref 30.0–36.0)
MCV: 93 fL (ref 78.0–100.0)
PLATELETS: 199 10*3/uL (ref 150–400)
RBC: 5.54 MIL/uL (ref 4.22–5.81)
RDW: 13.2 % (ref 11.5–15.5)
WBC: 11.7 10*3/uL — AB (ref 4.0–10.5)

## 2016-09-05 MED ORDER — TOPIRAMATE 50 MG PO TABS: 50.0000 mg | ORAL_TABLET | Freq: Two times a day (BID) | ORAL | 0 refills | Status: DC

## 2016-09-05 MED ORDER — DIVALPROEX SODIUM 500 MG PO DR TAB: 500.0000 mg | DELAYED_RELEASE_TABLET | Freq: Two times a day (BID) | ORAL | 0 refills | Status: DC

## 2016-09-05 MED ORDER — NICOTINE 21 MG/24HR TD PT24: 21.0000 mg | MEDICATED_PATCH | Freq: Every day | TRANSDERMAL | 0 refills | Status: DC

## 2016-09-05 MED ORDER — ALBUTEROL SULFATE HFA 108 (90 BASE) MCG/ACT IN AERS: 2.0000 | INHALATION_SPRAY | Freq: Four times a day (QID) | RESPIRATORY_TRACT | Status: DC | PRN

## 2016-09-05 MED ORDER — GABAPENTIN 300 MG PO CAPS: 300.0000 mg | ORAL_CAPSULE | Freq: Three times a day (TID) | ORAL | 0 refills | Status: DC

## 2016-09-05 MED ORDER — FLUOXETINE HCL 20 MG PO CAPS: 20.0000 mg | ORAL_CAPSULE | Freq: Every day | ORAL | 0 refills | Status: DC

## 2016-09-05 NOTE — Progress Notes (Signed)
D: Pt A & O X4. Denies SI, HI, AVH and pain at this time. D/C home as ordered. Bus pass fare ($3.00) given to pt at time of d/c. A: Scheduled medications administered as ordered with verbal education. Ativan held due to over sedation this AM. Emotional support and availability provided to pt. Encouraged pt to voice concerns and comply with current treatment regimen. D/C instructions reviewed with pt [facility] All belongings in assigned lockers returned to pt at time of d/c including suitcase in back closet. Safety maintained in Observation Unit without self harm gestures to report at this time.  R: Pt compliant with medications as ordered. Denies adverse drug reactions when assessed. Verbalized understanding related to d/c instructions. Signed belonging sheet in agreement with items received from lockers 57 & 58. Appears to be in no physical distress at time of departure from facility.

## 2016-09-05 NOTE — ED Triage Notes (Signed)
[place] [facility] [place] Pt endorses 2 40s of beer, but denies any other drug use. Pt states that he is upset about using his job and cut his wrists with the beer cans. [facility]. Endorses suicidal thoughts. Multiple superficial lacerations noted to L wrist. No active bleeding. A&Ox4. Ambulatory.

## 2016-09-05 NOTE — Progress Notes (Signed)
Patient denies pain, SI, AH/VH at this time. Sleeping on/off. Made no comp[aint. Accepted his bedtime meds. No behavioral issues noted. Will continue to monitor patient for safety and stability. Patient remains safe.

## 2016-09-05 NOTE — [facility] Counselor (Signed)
This Clinical research associatewriter called [facility] to schedule pt a follow up appointment for a clinical intake post discharge. Pt has an appointment scheduled on Monday, September 09, 2016 at 11:45am, along with a 14 day supply of medication. Pt was receptive to this information.

## 2016-09-05 NOTE — ED Notes (Signed)
Bed: WTR5 Expected date:  Expected time:  Means of arrival:  Comments: 

## 2016-09-05 NOTE — Discharge Summary (Signed)
Physician Discharge Summary Note  Patient:  Dalton ItoGuy Keay is an 50 y.o., male MRN:  [id number] DOB:  [birth year] Patient phone:  [phone] (home)  Patient address:   [address] [place] [address],  Total Time spent with patient: 15 minutes  Date of Admission:  09/03/2016 Date of Discharge: 09/05/2016  Reason for Admission:  Alcohol abuse with alcohol induced mood disorder.  Principal Problem: Alcohol abuse with alcohol-induced mood disorder [facility](HCC) Discharge Diagnoses: Patient Active Problem List   Diagnosis Date Noted  . Alcohol abuse with alcohol-induced mood disorder (HCC) [F10.14] 09/03/2016    Priority: High  . Depressive disorder [F32.9] 07/18/2013    Priority: High  . Alcohol dependence (HCC) [F10.20] 08/04/2013  . Anxiety state, unspecified [F41.1] 08/04/2013  . Suicidal thoughts [R45.851] 07/26/2013  . Alcohol abuse [F10.10] 07/18/2013  . S/P alcohol detoxification [Z09] 07/18/2013    Past Psychiatric History: Substance abuse, Depression  Past Medical History:  Past Medical History:  Diagnosis Date  . Alcoholism /alcohol abuse (HCC)   . COPD (chronic obstructive pulmonary disease) (HCC)   . Depression   . GERD (gastroesophageal reflux disease)   . Osteoarthritis   . Scoliosis     Past Surgical History:  Procedure Laterality Date  . APPENDECTOMY     Family History:  Family History  Problem Relation Age of Onset  . Cancer Mother     bladder  . Cancer Father     mesothelioma   Family Psychiatric  History: MDD Social History:  History  Alcohol Use  . 50.4 oz/week  . 84 Cans of beer per week    Comment: reports is an alcoholic     History  Drug Use No    Comment: Denies use    Social History   Social History  . Marital status: Single    Spouse name: N/A  . Number of children: N/A  . Years of education: N/A   Social History Main Topics  . Smoking status: Current Every Day Smoker    Packs/day: 1.00    Years: 15.00    Types:  Cigarettes  . Smokeless tobacco: Never Used     Comment: Refused cessation materials  . Alcohol use 50.4 oz/week    84 Cans of beer per week     Comment: reports is an alcoholic  . Drug use: No     Comment: Denies use  . Sexual activity: Yes   Other Topics Concern  . None   Social History Narrative  . None    Hospital Course: Pt spent two nights in the observation unit without incident. Pt was calm, cooperative, denied suicidal/homicidal ideations, denied auditory/visual hallucinations and did not appear to be responding to internal stimuli. Pt stated he wants to go to residential rehab so he can get off the alcohol and get a job. Pt appeared better rested this morning and was speaking coherently. Pt stated he has been staying at a room in a motel and will go back there until he can get a bed in a treatment program. Glee ArvinLaToya, counselor, [facility]45[facility]. Pt was given medication samples and bus pass, and his belongings and [facility].   Musculoskeletal: Strength & Muscle Tone: within normal limits Gait & Station: normal Patient leans: Right  Psychiatric Specialty Exam: Physical Exam  Review of Systems  Psychiatric/Behavioral: Positive for depression and substance abuse. Negative for hallucinations, memory loss and suicidal ideas. The patient is  not nervous/anxious and does not have insomnia.   All other systems reviewed and are negative.   Blood pressure 106/66, pulse 98, temperature 97.7 F (36.5 C), temperature source Oral, resp. rate 18, height 5\' 9"  (1.753 m), weight 114.3 kg (252 lb), SpO2 100 %.Body mass index is 37.21 kg/m.  General Appearance: Casual and Fairly Groomed  Eye Contact:  Good  Speech:  Clear and Coherent  Volume:  Decreased  Mood:  Depressed  Affect:  Appropriate and Congruent  Thought Process:  Coherent  Orientation:  Full (Time, Place, and Person)  Thought Content:  WDL and Logical   Suicidal Thoughts:  No  Homicidal Thoughts:  No  Memory:  Immediate;   Good Recent;   Good Remote;   Fair  Judgement:  Fair  Insight:  Fair  Psychomotor Activity:  Decreased  Concentration:  Concentration: Good and Attention Span: Good  Recall:  [employer] of Knowledge:  Fair  Language:  Good  Akathisia:  No  Handed:  Right  AIMS (if indicated):     Assets:  Communication Skills Desire for Improvement Resilience  ADL's:  Intact  Cognition:  WNL  Sleep:   snores, sleep apnea     Have you used any form of tobacco in the last 30 days? (Cigarettes, Smokeless Tobacco, Cigars, and/or Pipes): Yes  Has this patient used any form of tobacco in the last 30 days? (Cigarettes, Smokeless Tobacco, Cigars, and/or Pipes) Yes, nicotine patch sent home with patient  Blood Alcohol level:  Lab Results  Component Value Date   ETH <5 09/02/2016   ETH <5 09/19/2015     See Psychiatric Specialty Exam and Suicide Risk Assessment completed by Attending Physician prior to discharge.  Discharge destination:  Home, then [facility] on 09/09/16  Is patient on multiple antipsychotic therapies at discharge:  No   Has Patient had three or more failed trials of antipsychotic monotherapy by history:  No  Recommended Plan for Multiple Antipsychotic Therapies: NA     Medication List    STOP taking these medications   cetirizine 10 MG tablet Commonly known as:  ZYRTEC   hydrOXYzine 50 MG capsule Commonly known as:  VISTARIL   traZODone 50 MG tablet Commonly known as:  DESYREL     TAKE these medications     Indication  albuterol 108 (90 Base) MCG/ACT inhaler Commonly known as:  PROVENTIL HFA;VENTOLIN HFA Inhale 2 puffs into the lungs every 6 (six) hours as needed for wheezing or shortness of breath.  Indication:  Chronic Obstructive Lung Disease, sample - pt has appointment at [facility] on 09/09/16 at 11:45AM   divalproex 500 MG DR tablet Commonly known as:  DEPAKOTE Take 1 tablet (500 mg  total) by mouth 2 (two) times daily.  Indication:  Multiple Seizure Types, samples - pt has appointment at [facility] on 09/09/16 at 11:45AM   FLUoxetine 20 MG capsule Commonly known as:  PROZAC Take 1 capsule (20 mg total) by mouth daily. Start taking on:  09/06/2016 What changed:  additional instructions  Indication:  Depression, samples - pt has appointment [facility] on Monday 09/09/16 at 11:45AM   gabapentin 300 MG capsule Commonly known as:  NEURONTIN Take 1 capsule (300 mg total) by mouth 3 (three) times daily.  Indication:  Neuropathic Pain, sample - pt has appt at [facility] on 09/09/16 at 11:45AM   nicotine 21 mg/24hr patch Commonly known as:  NICODERM CQ - dosed in mg/24 hours Place 1 patch (21 mg  total) onto the skin daily.  Indication:  Nicotine Addiction, sample pt has [facility] appt on 09/09/16 at 11:45AM   topiramate 50 MG tablet Commonly known as:  TOPAMAX Take 1 tablet (50 mg total) by mouth 2 (two) times daily.  Indication:  Migraine Headache, samples pt has [facility] appt on 09/09/16 at 11:45AM        Follow-up recommendations:  Other:  Follow up at [facility] on 09/09/16 at 11:45AM for clinical intake appointment  Pt discharged to home with outpatient resources in place for Monday 09/09/16 at [facility] for a clinical intake appointment.   Signed: Laveda Abbe, NP 09/05/2016, 11:59 AM

## 2016-09-06 ENCOUNTER — Encounter (HOSPITAL_COMMUNITY): Payer: Self-pay | Admitting: *Deleted

## 2016-09-06 ENCOUNTER — Observation Stay (HOSPITAL_COMMUNITY)
Admission: EM | Admit: 2016-09-06 | Discharge: 2016-09-07 | Disposition: A | Discharge status: Home / Self Care | Payer: Federal, State, Local not specified - Other | Source: Intra-hospital | Attending: Psychiatry | Admitting: Psychiatry

## 2016-09-06 DIAGNOSIS — F332 Major depressive disorder, recurrent severe without psychotic features: Secondary | ICD-10-CM | POA: Insufficient documentation

## 2016-09-06 DIAGNOSIS — Z590 Homelessness: Secondary | ICD-10-CM | POA: Insufficient documentation

## 2016-09-06 DIAGNOSIS — F1020 Alcohol dependence, uncomplicated: Secondary | ICD-10-CM | POA: Insufficient documentation

## 2016-09-06 DIAGNOSIS — F1994 Other psychoactive substance use, unspecified with psychoactive substance-induced mood disorder: Secondary | ICD-10-CM | POA: Diagnosis present

## 2016-09-06 DIAGNOSIS — J449 Chronic obstructive pulmonary disease, unspecified: Secondary | ICD-10-CM | POA: Insufficient documentation

## 2016-09-06 DIAGNOSIS — F1024 Alcohol dependence with alcohol-induced mood disorder: Principal | ICD-10-CM | POA: Insufficient documentation

## 2016-09-06 DIAGNOSIS — F419 Anxiety disorder, unspecified: Secondary | ICD-10-CM | POA: Insufficient documentation

## 2016-09-06 DIAGNOSIS — R45851 Suicidal ideations: Secondary | ICD-10-CM

## 2016-09-06 DIAGNOSIS — Z79899 Other long term (current) drug therapy: Secondary | ICD-10-CM | POA: Insufficient documentation

## 2016-09-06 LAB — RAPID URINE DRUG SCREEN, HOSP PERFORMED
Amphetamines: NOT DETECTED
BARBITURATES: NOT DETECTED
Benzodiazepines: POSITIVE — AB
Cocaine: NOT DETECTED
Opiates: NOT DETECTED
TETRAHYDROCANNABINOL: NOT DETECTED

## 2016-09-06 LAB — COMPREHENSIVE METABOLIC PANEL
ALBUMIN: 4.4 g/dL (ref 3.5–5.0)
ALK PHOS: 64 U/L (ref 38–126)
ALT: 57 U/L (ref 17–63)
ANION GAP: 13 (ref 5–15)
AST: 40 U/L (ref 15–41)
BUN: 16 mg/dL (ref 6–20)
CALCIUM: 9.5 mg/dL (ref 8.9–10.3)
CHLORIDE: 105 mmol/L (ref 101–111)
CO2: 19 mmol/L — AB (ref 22–32)
Creatinine, Ser: 1.03 mg/dL (ref 0.61–1.24)
GFR calc non Af Amer: >60 mL/min (ref >60)
GLUCOSE: 97 mg/dL (ref 65–99)
Potassium: 3.7 mmol/L (ref 3.5–5.1)
SODIUM: 137 mmol/L (ref 135–145)
TOTAL PROTEIN: 7.8 g/dL (ref 6.5–8.1)
Total Bilirubin: 0.7 mg/dL (ref 0.3–1.2)

## 2016-09-06 LAB — ACETAMINOPHEN LEVEL: Acetaminophen (Tylenol), Serum: <10 ug/mL — ABNORMAL LOW (ref 10–30)

## 2016-09-06 LAB — ETHANOL: ALCOHOL ETHYL (B): 65 mg/dL — AB (ref <5)

## 2016-09-06 LAB — SALICYLATE LEVEL: Salicylate Lvl: <7 mg/dL (ref 2.8–30.0)

## 2016-09-06 MED ORDER — ALBUTEROL SULFATE HFA 108 (90 BASE) MCG/ACT IN AERS: 2.0000 | INHALATION_SPRAY | Freq: Four times a day (QID) | RESPIRATORY_TRACT | Status: DC | PRN

## 2016-09-06 MED ORDER — ALUM & MAG HYDROXIDE-SIMETH 200-200-20 MG/5ML PO SUSP: 30.0000 mL | ORAL | Status: DC | PRN

## 2016-09-06 MED ORDER — TOPIRAMATE 25 MG PO TABS
50.0000 mg | ORAL_TABLET | Freq: Two times a day (BID) | ORAL | Status: DC
  Administered 2016-09-06 – 2016-09-07 (×3): 50 mg via ORAL
  Filled 2016-09-06 (×3): qty 2

## 2016-09-06 MED ORDER — TRAZODONE HCL 50 MG PO TABS: 50.0000 mg | ORAL_TABLET | Freq: Every evening | ORAL | Status: DC | PRN

## 2016-09-06 MED ORDER — DIVALPROEX SODIUM 500 MG PO DR TAB
500.0000 mg | DELAYED_RELEASE_TABLET | Freq: Two times a day (BID) | ORAL | Status: DC
  Administered 2016-09-06 – 2016-09-07 (×3): 500 mg via ORAL
  Filled 2016-09-06 (×3): qty 1

## 2016-09-06 MED ORDER — GABAPENTIN 300 MG PO CAPS
300.0000 mg | ORAL_CAPSULE | Freq: Three times a day (TID) | ORAL | Status: DC
  Administered 2016-09-06 – 2016-09-07 (×3): 300 mg via ORAL
  Filled 2016-09-06 (×3): qty 1

## 2016-09-06 MED ORDER — HYDROXYZINE HCL 25 MG PO TABS: 25.0000 mg | ORAL_TABLET | Freq: Three times a day (TID) | ORAL | Status: DC | PRN

## 2016-09-06 MED ORDER — NICOTINE 21 MG/24HR TD PT24
21.0000 mg | MEDICATED_PATCH | Freq: Every day | TRANSDERMAL | Status: DC
  Filled 2016-09-06: qty 1

## 2016-09-06 MED ORDER — MAGNESIUM HYDROXIDE 400 MG/5ML PO SUSP: 30.0000 mL | Freq: Every day | ORAL | Status: DC | PRN

## 2016-09-06 MED ORDER — FLUOXETINE HCL 20 MG PO CAPS
20.0000 mg | ORAL_CAPSULE | Freq: Every day | ORAL | Status: DC
  Administered 2016-09-06 – 2016-09-07 (×2): 20 mg via ORAL
  Filled 2016-09-06 (×2): qty 1

## 2016-09-06 NOTE — Progress Notes (Signed)
This Clinical research associatewriter spoke with pt about resources around the community. This Clinical research associatewriter provided this Pt with information on resources on alcohol usage, alcohol and substance abuse treatment facilities, affordable housing and psychiatric inpatient and outpatient assistance around the community. This Clinical research associatewriter informed this Pt to look over as well as contact the provided resources today and tomorrow. This Clinical research associatewriter informed the Pt that we will be able to assist him if he needs help with contacting any of the resources. This Pt was receptive to the information given to him by this writer began to look over the resources provided.

## 2016-09-06 NOTE — Progress Notes (Signed)
D: Patient sleeping most of the shift. Woke up, took a shower and went back to sleep. Patient denied pain, SI, AH/VH at this time. Made no complaint.  A: Staff encouraged patient to verbalize needs to staff. Offered support and encouragement. Safety maintained. Will continue to monitor patient.  R: Patient sleeping at this time.

## 2016-09-06 NOTE — ED Provider Notes (Signed)
[facility]-EMERGENCY DEPT Provider Note   CSN: [id number] Arrival date & time: 09/05/16  2326     History   Chief Complaint Chief Complaint  Patient presents with  . Suicidal    HPI Dalton Molina is a 50 y.o. male who presents with SI and self injury. PMH significant for alcoholism, depression, COPD, GERD, scoliosis. He was just discharged from [facility] 2 days ago. He is set up for an appointment at [facility] on Monday however patient feels that he cannot stay sober until then. He drank 2 40s tonight and felt guilty so he tried to cut himself with the beer can. He has been taking all his meds. Denies illicit drug use. Denies specific plan or access to weapons. He is currently homeless. Denies hx of severe withdrawal symptoms. Denies HA, chest pain, SOB, cough, abdominal pain, N/V, or any other specific complaints.  HPI  Past Medical History:  Diagnosis Date  . Alcoholism /alcohol abuse (HCC)   . COPD (chronic obstructive pulmonary disease) (HCC)   . Depression   . GERD (gastroesophageal reflux disease)   . Osteoarthritis   . Scoliosis     Patient Active Problem List   Diagnosis Date Noted  . Alcohol abuse with alcohol-induced mood disorder (HCC) 09/03/2016  . Alcohol dependence (HCC) 08/04/2013  . Anxiety state, unspecified 08/04/2013  . Suicidal thoughts 07/26/2013  . Depressive disorder 07/18/2013  . Alcohol abuse 07/18/2013  . S/P alcohol detoxification 07/18/2013    Past Surgical History:  Procedure Laterality Date  . APPENDECTOMY         Home Medications    Prior to Admission medications   Medication Sig Start Date End Date Taking? Authorizing Provider  albuterol (PROVENTIL HFA;VENTOLIN HFA) 108 (90 Base) MCG/ACT inhaler Inhale 2 puffs into the lungs every 6 (six) hours as needed for wheezing or shortness of breath. 09/05/16  Yes Laveda Abbe, NP  divalproex (DEPAKOTE) 500 MG DR tablet Take 1 tablet (500 mg total) by mouth 2 (two) times daily. 09/05/16  Yes  Laveda Abbe, NP  FLUoxetine (PROZAC) 20 MG capsule Take 1 capsule (20 mg total) by mouth daily. 09/06/16  Yes Laveda Abbe, NP  gabapentin (NEURONTIN) 300 MG capsule Take 1 capsule (300 mg total) by mouth 3 (three) times daily. 09/05/16  Yes Laveda Abbe, NP  nicotine (NICODERM CQ - DOSED IN MG/24 HOURS) 21 mg/24hr patch Place 1 patch (21 mg total) onto the skin daily. 09/05/16  Yes Laveda Abbe, NP  topiramate (TOPAMAX) 50 MG tablet Take 1 tablet (50 mg total) by mouth 2 (two) times daily. 09/05/16  Yes Laveda Abbe, NP    Family History Family History  Problem Relation Age of Onset  . Cancer Mother     bladder  . Cancer Father     mesothelioma    Social History Social History  Substance Use Topics  . Smoking status: Current Every Day Smoker    Packs/day: 1.00    Years: 15.00    Types: Cigarettes  . Smokeless tobacco: Never Used     Comment: Refused cessation materials  . Alcohol use 50.4 oz/week    84 Cans of beer per week     Comment: reports is an alcoholic     Allergies   Percocet [oxycodone-acetaminophen]   Review of Systems Review of Systems  Respiratory: Negative for shortness of breath.   Cardiovascular: Negative for chest pain.  Gastrointestinal: Negative for abdominal pain, nausea and vomiting.  Neurological: Negative for headaches.  Psychiatric/Behavioral: Positive for dysphoric mood, self-injury and suicidal ideas. Negative for agitation and hallucinations. The patient is not nervous/anxious.      Physical Exam Updated Vital Signs BP 95/66 (BP Location: Right Arm)   Pulse 99   Temp 98.3 F (36.8 C) (Oral)   Resp 16   SpO2 95%   Physical Exam  Constitutional: He is oriented to person, place, and time. He appears well-developed and well-nourished. No distress.  Pleasant, NAD  HENT:  Head: Normocephalic and atraumatic.  Eyes: Conjunctivae are normal. Pupils are equal, round, and reactive to light. Right eye  exhibits no discharge. Left eye exhibits no discharge. No scleral icterus.  Neck: Normal range of motion. Neck supple.  Cardiovascular: Normal rate and regular rhythm.  Exam reveals no gallop and no friction rub.   No murmur heard. Pulmonary/Chest: Effort normal and breath sounds normal. No respiratory distress. He has no wheezes. He has no rales. He exhibits no tenderness.  Abdominal: Soft. Bowel sounds are normal. He exhibits no distension. There is no tenderness.  Musculoskeletal: He exhibits no edema.  Neurological: He is alert and oriented to person, place, and time.  Skin: Skin is warm and dry.  Several superficial abrasions on L wrist  Psychiatric: He has a normal mood and affect.  Nursing note and vitals reviewed.    ED Treatments / Results  Labs (all labs ordered are listed, but only abnormal results are displayed) Labs Reviewed  COMPREHENSIVE METABOLIC PANEL - Abnormal; Notable for the following:       Result Value   CO2 19 (*)    All other components within normal limits  ETHANOL - Abnormal; Notable for the following:    Alcohol, Ethyl (B) 65 (*)    All other components within normal limits  ACETAMINOPHEN LEVEL - Abnormal; Notable for the following:    Acetaminophen (Tylenol), Serum <10 (*)    All other components within normal limits  CBC - Abnormal; Notable for the following:    WBC 11.7 (*)    Hemoglobin 18.8 (*)    MCHC 36.5 (*)    All other components within normal limits  RAPID URINE DRUG SCREEN, HOSP PERFORMED - Abnormal; Notable for the following:    Benzodiazepines POSITIVE (*)    All other components within normal limits  SALICYLATE LEVEL    EKG  EKG Interpretation None       Radiology No results found.  Procedures Procedures (including critical care time)  Medications Ordered in ED Medications - No data to display   Initial Impression / Assessment and Plan / ED Course  I have reviewed the triage vital signs and the nursing  notes.  Pertinent labs & imaging results that were available during my care of the patient were reviewed by me and considered in my medical decision making (see chart for details).  Clinical Course   [id number] year old male presents with SI with self-injury. Patient is afebrile, not tachycardic or tachypneic, normotensive, and not hypoxic. CBC remarkable for mild leukocytosis. CMP remarkable for CO2 of 19. ETOH level is 65. UDS positive for benzos which is not on his med list. Patient has no specific pain or physical complaints. He is medically cleared. TTS consult placed.  Spoke with Berna SpareMarcus who states that patient is eligible for observation. Dr. Lucianne MussKumar is accepting.   Final Clinical Impressions(s) / ED Diagnoses   Final diagnoses:  Suicidal ideations  Self-inflicted injury  Episode of recurrent major depressive disorder, unspecified depression episode severity (HCC)  New Prescriptions New Prescriptions   No medications on file     Bethel Born, PA-C 09/06/16 1610    Tomasita Crumble, MD 09/06/16 [phone]

## 2016-09-06 NOTE — Progress Notes (Signed)
D: Patient denies HI and A/V hallucinations; patient reports on and off thoughts of SI; patient contracts for safety  A: Monitored continuously in observation;  patient educated about medications; patient given medications per physician orders; patient encouraged to express feelings and/or concerns  R: Patient has been asleep most of the time

## 2016-09-06 NOTE — H&P (Signed)
[facility] Provider Admission PAA/H&P  Patient Identification: Dalton Molina MRN:  [id number] Date of Evaluation:  09/06/2016 Chief Complaint:  MDD Recurrent Severe alcohol use disorder severe Principal Diagnosis: Suicidal ideation Diagnosis:   Patient Active Problem List   Diagnosis Date Noted  . Substance induced mood disorder ([place]) [F19.94] 09/06/2016  . Alcohol abuse with alcohol-induced mood disorder ([place]) [F10.14] 09/03/2016  . Alcohol dependence ([place]) [F10.20] 08/04/2013  . Anxiety state, unspecified [F41.1] 08/04/2013  . Suicidal thoughts [R45.851] 07/26/2013  . Depressive disorder [F32.9] 07/18/2013  . Alcohol abuse [F10.10] 07/18/2013  . S/P alcohol detoxification [Z09] 07/18/2013   History of Present Illness: Dalton Molina is a 50 year old male who presented to [place] ED after attempting to cut his left wrist with a beer can. Patient reports that he drank 2 (40 ounce) beers and cut open one can and used it to cut his wrist. Reports that he continues to feel suicidal, but can not express a definite plan. Reports that he wants help to stop drinking and feels like he will keep drinking until his appointment with [facility] on 09/09/16. Denies homicidal ideations and audiovisual hallucinations. Denies illicit drug use.  Associated Signs/Symptoms: Depression Symptoms:  depressed mood, anhedonia, hopelessness, (Hypo) Manic Symptoms:  Impulsivity, Anxiety Symptoms:  Excessive Worry, Psychotic Symptoms:  Hallucinations: None PTSD Symptoms: Negative Total Time spent with patient: 15 minutes  Past Psychiatric History: Depression, Suicide Ideations, Alcoholism  Is the patient at risk to self? Yes.    Has the patient been a risk to self in the past 6 months? Yes.    Has the patient been a risk to self within the distant past? No.  Is the patient a risk to others? No.  Has the patient been a risk to others in the past 6 months? No.  Has the patient been a risk to  others within the distant past? No.   Prior Inpatient Therapy:  Multiple inpatient admissions Prior Outpatient Therapy:  [facility]  Alcohol Screening:   Substance Abuse History in the last 12 months:  Yes.   Consequences of Substance Abuse: Financial Previous Psychotropic Medications: Yes  Psychological Evaluations: Yes  Past Medical History:  Past Medical History:  Diagnosis Date  . Alcoholism /alcohol abuse ([place])   . COPD (chronic obstructive pulmonary disease) ([place])   . Depression   . GERD (gastroesophageal reflux disease)   . Osteoarthritis   . Scoliosis     Past Surgical History:  Procedure Laterality Date  . APPENDECTOMY     Family History:  Family History  Problem Relation Age of Onset  . Cancer Mother     bladder  . Cancer Father     mesothelioma   Family Psychiatric History: No pertinent history Tobacco Screening:   Social History:  History  Alcohol Use  . 50.4 oz/week  . 84 Cans of beer per week    Comment: reports is an alcoholic     History  Drug Use No    Comment: Denies use    Additional Social History:                           Allergies:   Allergies  Allergen Reactions  . Percocet [Oxycodone-Acetaminophen] Anaphylaxis and Rash   Lab Results:  Results for orders placed or performed during the hospital encounter of 09/05/16 (from the past 48 hour(s))  Rapid urine drug screen (hospital performed)     Status: Abnormal   Collection  Time: 09/05/16  2:27 AM  Result Value Ref Range   Opiates NONE DETECTED NONE DETECTED   Cocaine NONE DETECTED NONE DETECTED   Benzodiazepines POSITIVE (A) NONE DETECTED   Amphetamines NONE DETECTED NONE DETECTED   Tetrahydrocannabinol NONE DETECTED NONE DETECTED   Barbiturates NONE DETECTED NONE DETECTED    Comment:        DRUG SCREEN FOR MEDICAL PURPOSES ONLY.  IF CONFIRMATION IS NEEDED FOR ANY PURPOSE, NOTIFY LAB WITHIN 5 DAYS.        LOWEST DETECTABLE LIMITS FOR URINE DRUG SCREEN Drug Class        Cutoff (ng/mL) Amphetamine      1000 Barbiturate      200 Benzodiazepine   637 Tricyclics       858 Opiates          300 Cocaine          300 THC              50   Comprehensive metabolic panel     Status: Abnormal   Collection Time: 09/05/16 11:42 PM  Result Value Ref Range   Sodium 137 135 - 145 mmol/L   Potassium 3.7 3.5 - 5.1 mmol/L   Chloride 105 101 - 111 mmol/L   CO2 19 (L) 22 - 32 mmol/L   Glucose, Bld 97 65 - 99 mg/dL   BUN 16 6 - 20 mg/dL   Creatinine, Ser 1.03 0.61 - 1.24 mg/dL   Calcium 9.5 8.9 - 10.3 mg/dL   Total Protein 7.8 6.5 - 8.1 g/dL   Albumin 4.4 3.5 - 5.0 g/dL   AST 40 15 - 41 U/L   ALT 57 17 - 63 U/L   Alkaline Phosphatase 64 38 - 126 U/L   Total Bilirubin 0.7 0.3 - 1.2 mg/dL   GFR calc non Af Amer >60 >60 mL/min   GFR calc Af Amer >60 >60 mL/min    Comment: (NOTE) The eGFR has been calculated using the CKD EPI equation. This calculation has not been validated in all clinical situations. eGFR's persistently <60 mL/min signify possible Chronic Kidney Disease.    Anion gap 13 5 - 15  Ethanol     Status: Abnormal   Collection Time: 09/05/16 11:42 PM  Result Value Ref Range   Alcohol, Ethyl (B) 65 (H) <5 mg/dL    Comment:        LOWEST DETECTABLE LIMIT FOR SERUM ALCOHOL IS 5 mg/dL FOR MEDICAL PURPOSES ONLY   Salicylate level     Status: None   Collection Time: 09/05/16 11:42 PM  Result Value Ref Range   Salicylate Lvl <8.5 2.8 - 30.0 mg/dL  Acetaminophen level     Status: Abnormal   Collection Time: 09/05/16 11:42 PM  Result Value Ref Range   Acetaminophen (Tylenol), Serum <10 (L) 10 - 30 ug/mL    Comment:        THERAPEUTIC CONCENTRATIONS VARY SIGNIFICANTLY. A RANGE OF 10-30 ug/mL MAY BE AN EFFECTIVE CONCENTRATION FOR MANY PATIENTS. HOWEVER, SOME ARE BEST TREATED AT CONCENTRATIONS OUTSIDE THIS RANGE. ACETAMINOPHEN CONCENTRATIONS >150 ug/mL AT 4 HOURS AFTER INGESTION AND >50 ug/mL AT 12 HOURS AFTER INGESTION ARE OFTEN ASSOCIATED  WITH TOXIC REACTIONS.   cbc     Status: Abnormal   Collection Time: 09/05/16 11:42 PM  Result Value Ref Range   WBC 11.7 (H) 4.0 - 10.5 K/uL   RBC 5.54 4.22 - 5.81 MIL/uL   Hemoglobin 18.8 (H) 13.0 - 17.0 g/dL  HCT 51.5 39.0 - 52.0 %   MCV 93.0 78.0 - 100.0 fL   MCH 33.9 26.0 - 34.0 pg   MCHC 36.5 (H) 30.0 - 36.0 g/dL   RDW 10.3 07.8 - 56.8 %   Platelets 199 150 - 400 K/uL    Blood Alcohol level:  Lab Results  Component Value Date   ETH 65 (H) 09/05/2016   ETH <5 09/02/2016    Metabolic Disorder Labs:  No results found for: HGBA1C, MPG No results found for: PROLACTIN No results found for: CHOL, TRIG, HDL, CHOLHDL, VLDL, LDLCALC  Current Medications: Current Facility-Administered Medications  Medication Dose Route Frequency Provider Last Rate Last Dose  . albuterol (PROVENTIL HFA;VENTOLIN HFA) 108 (90 Base) MCG/ACT inhaler 2 puff  2 puff Inhalation Q6H PRN Jackelyn Poling, NP      . alum & mag hydroxide-simeth (MAALOX/MYLANTA) 200-200-20 MG/5ML suspension 30 mL  30 mL Oral Q4H PRN Jackelyn Poling, NP      . divalproex (DEPAKOTE) DR tablet 500 mg  500 mg Oral BID Jackelyn Poling, NP      . FLUoxetine (PROZAC) capsule 20 mg  20 mg Oral Daily Jackelyn Poling, NP      . gabapentin (NEURONTIN) capsule 300 mg  300 mg Oral TID Jackelyn Poling, NP      . hydrOXYzine (ATARAX/VISTARIL) tablet 25 mg  25 mg Oral TID PRN Jackelyn Poling, NP      . magnesium hydroxide (MILK OF MAGNESIA) suspension 30 mL  30 mL Oral Daily PRN Jackelyn Poling, NP      . nicotine (NICODERM CQ - dosed in mg/24 hours) patch 21 mg  21 mg Transdermal Daily Jackelyn Poling, NP      . topiramate (TOPAMAX) tablet 50 mg  50 mg Oral BID Jackelyn Poling, NP      . traZODone (DESYREL) tablet 50 mg  50 mg Oral QHS PRN Jackelyn Poling, NP       PTA Medications: Prescriptions Prior to Admission  Medication Sig Dispense Refill Last Dose  . albuterol (PROVENTIL HFA;VENTOLIN HFA) 108 (90 Base) MCG/ACT inhaler Inhale 2 puffs into the lungs  every 6 (six) hours as needed for wheezing or shortness of breath. 1 Inhaler  09/05/2016 at Unknown time  . divalproex (DEPAKOTE) 500 MG DR tablet Take 1 tablet (500 mg total) by mouth 2 (two) times daily. 28 tablet 0 09/05/2016 at Am  . FLUoxetine (PROZAC) 20 MG capsule Take 1 capsule (20 mg total) by mouth daily. 14 capsule 0 09/05/2016 at Unknown time  . gabapentin (NEURONTIN) 300 MG capsule Take 1 capsule (300 mg total) by mouth 3 (three) times daily. 42 capsule 0 09/05/2016 at Unknown time  . nicotine (NICODERM CQ - DOSED IN MG/24 HOURS) 21 mg/24hr patch Place 1 patch (21 mg total) onto the skin daily. 28 patch 0 09/05/2016 at Unknown time  . topiramate (TOPAMAX) 50 MG tablet Take 1 tablet (50 mg total) by mouth 2 (two) times daily. 28 tablet 0 09/05/2016 at AM    Musculoskeletal: Strength & Muscle Tone: within normal limits Gait & Station: normal Patient leans: N/A  Psychiatric Specialty Exam: Physical Exam  Constitutional: He is oriented to person, place, and time. He appears well-developed and well-nourished. No distress.  HENT:  Head: Normocephalic and atraumatic.  Right Ear: External ear normal.  Left Ear: External ear normal.  Eyes: Conjunctivae are normal. Right eye exhibits no discharge. Left eye exhibits no discharge. No scleral  icterus.  Neck: Normal range of motion.  Respiratory: Effort normal.  Musculoskeletal: Normal range of motion.  Neurological: He is alert and oriented to person, place, and time.  Skin: Skin is warm and dry. He is not diaphoretic.     Psychiatric: His speech is normal. His mood appears not anxious. His affect is not angry, not blunt, not labile and not inappropriate. He is withdrawn. He is not agitated, not aggressive, not hyperactive, not slowed, not actively hallucinating and not combative. Thought content is not paranoid and not delusional. Cognition and memory are normal. He expresses impulsivity. He exhibits a depressed mood. He expresses  suicidal ideation. He expresses no homicidal ideation. He expresses no suicidal plans.    Review of Systems  Psychiatric/Behavioral: Positive for depression, substance abuse and suicidal ideas. Negative for hallucinations and memory loss. The patient has insomnia. The patient is not nervous/anxious.   All other systems reviewed and are negative.   Blood pressure 124/76, pulse 92, temperature 97.7 F (36.5 C), temperature source Oral, resp. rate 18.There is no height or weight on file to calculate BMI.  General Appearance: Disheveled  Eye Contact:  Good  Speech:  Normal Rate  Volume:  Normal  Mood:  Depressed and Hopeless  Affect:  Depressed  Thought Process:  Coherent  Orientation:  Full (Time, Place, and Person)  Thought Content:  WDL  Suicidal Thoughts:  Yes.  without intent/plan  Homicidal Thoughts:  No  Memory:  Immediate;   Good Recent;   Good Remote;   Good  Judgement:  Fair  Insight:  Good  Psychomotor Activity:  Normal  Concentration:  Concentration: Good and Attention Span: Good  Recall:  Good  Fund of Knowledge:  Good  Language:  Good  Akathisia:  NA  Handed:  Right  AIMS (if indicated):     Assets:  Communication Skills Desire for Improvement Physical Health  ADL's:  Intact  Cognition:  WNL  Sleep:         Treatment Plan Summary: Daily contact with patient to assess and evaluate symptoms and progress in treatment and Medication management  Observation Level/Precautions:  Continuous Observation Laboratory:  See above labs Psychotherapy:   Medications:  Depakote 500 mg BID, fluoxetine 20 mg daily, gabapentin 300 mg TID, topiramate 50 mg BID Consultations:   Discharge Concerns:  Homeless Estimated LOS: 1 day Other:      Rozetta Nunnery, NP 10/20/20176:46 AM
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
## 2016-09-06 NOTE — ED Notes (Signed)
[facility] and [facility]

## 2016-09-06 NOTE — Progress Notes (Signed)
Admission Note:  [facility] Patient came to ED complaining of suicide attempt. Cut wrist with a can. Patient was discharged from obs. Unit 2 days ago. On admission here, he denies pain, SI, ah/vh. Patient stated 'I just want to rest. Vital sign WNL.  No contraband was found. Mild  Superficial laceration noted on L wrist. No behavioral issues noted. Fell asleep immediately. Will continue to monitor patient for safety and stability.

## 2016-09-07 DIAGNOSIS — Z8052 Family history of malignant neoplasm of bladder: Secondary | ICD-10-CM | POA: Diagnosis not present

## 2016-09-07 DIAGNOSIS — F1994 Other psychoactive substance use, unspecified with psychoactive substance-induced mood disorder: Secondary | ICD-10-CM

## 2016-09-07 DIAGNOSIS — F17210 Nicotine dependence, cigarettes, uncomplicated: Secondary | ICD-10-CM | POA: Diagnosis not present

## 2016-09-07 DIAGNOSIS — Z808 Family history of malignant neoplasm of other organs or systems: Secondary | ICD-10-CM | POA: Diagnosis not present

## 2016-09-07 MED ORDER — NICOTINE 21 MG/24HR TD PT24: 21.0000 mg | MEDICATED_PATCH | Freq: Every day | TRANSDERMAL | 0 refills | Status: DC

## 2016-09-07 MED ORDER — DIVALPROEX SODIUM 500 MG PO DR TAB: 500.0000 mg | DELAYED_RELEASE_TABLET | Freq: Two times a day (BID) | ORAL | 0 refills | Status: DC

## 2016-09-07 MED ORDER — TOPIRAMATE 50 MG PO TABS: 50.0000 mg | ORAL_TABLET | Freq: Two times a day (BID) | ORAL | 0 refills | Status: DC

## 2016-09-07 MED ORDER — FLUOXETINE HCL 20 MG PO CAPS: 20.0000 mg | ORAL_CAPSULE | Freq: Every day | ORAL | 0 refills | Status: DC

## 2016-09-07 MED ORDER — GABAPENTIN 300 MG PO CAPS: 300.0000 mg | ORAL_CAPSULE | Freq: Three times a day (TID) | ORAL | 0 refills | Status: DC

## 2016-09-07 NOTE — Discharge Summary (Signed)
[facility]ation [facility] Discharge Summary Note  Patient:  Dalton Molina is an 50 y.o., male MRN:  [id number] DOB:  04/13/1966 Patient phone:  [phone] (home)  Patient address:   [address] [place] [address],  Total Time spent with patient: 15 minutes  Date of Admission:  09/06/2016 Date of Discharge: 09/07/2016  Reason for Admission:  Alcohol abuse with alcohol induced mood disorder.  Principal Problem: Suicidal ideation Discharge Diagnoses: Patient Active Problem List   Diagnosis Date Noted  . Substance induced mood disorder (HCC) [F19.94] 09/06/2016  . Alcohol abuse with alcohol-induced mood disorder (HCC) [F10.14] 09/03/2016  . Alcohol dependence (HCC) [F10.20] 08/04/2013  . Anxiety state, unspecified [F41.1] 08/04/2013  . Suicidal ideation [R45.851] 07/26/2013  . Depressive disorder [F32.9] 07/18/2013  . Alcohol abuse [F10.10] 07/18/2013  . S/P alcohol detoxification [Z09] 07/18/2013    Past Psychiatric History: Substance abuse, Depression  Past Medical History:  Past Medical History:  Diagnosis Date  . Alcoholism /alcohol abuse (HCC)   . COPD (chronic obstructive pulmonary disease) (HCC)   . Depression   . GERD (gastroesophageal reflux disease)   . Osteoarthritis   . Scoliosis     Past Surgical History:  Procedure Laterality Date  . APPENDECTOMY     Family History:  Family History  Problem Relation Age of Onset  . Cancer Mother     bladder  . Cancer Father     mesothelioma   Family Psychiatric  History: MDD Social History:  History  Alcohol Use  . 50.4 oz/week  . 84 Cans of beer per week    Comment: reports is an alcoholic     History  Drug Use No    Comment: Denies use    Social History   Social History  . Marital status: Single    Spouse name: N/A  . Number of children: N/A  . Years of education: N/A   Social History Main Topics  . Smoking status: Current Every Day Smoker    Packs/day: 1.00    Years: 15.00    Types:  Cigarettes  . Smokeless tobacco: Never Used     Comment: Refused cessation materials  . Alcohol use 50.4 oz/week    84 Cans of beer per week     Comment: reports is an alcoholic  . Drug use: No     Comment: Denies use  . Sexual activity: Yes   Other Topics Concern  . None   Social History Narrative  . None    Hospital Course:   Dalton Molina is a 50 year old male who presented to [facility] ED after attempting to cut his left wrist with a beer can. Patient reports that he drank 2 (40 ounce) beers and cut open one can and used it to cut his wrist. Reports that he continues to feel suicidal, but can not express a definite plan. Reports that he wants help to stop drinking and feels like he will keep drinking until his appointment with [facility] on 09/09/16. Denies homicidal ideations and audiovisual hallucinations. Denies illicit drug use. Patient was recently discharged from the [facility] unit on 09/05/2016 with plan to follow up with [facility]. On readmission his alcohol level was 65.    Pt spent the night in the observation unit without incident. Pt was calm, cooperative, denied suicidal/homicidal ideations, denied auditory/visual hallucinations and did not appear to be responding to internal stimuli. An appointment was set for patient at [facility] for 09/09/2016 at 11:45AM during his previous admission to [facility]  Unit earlier in the week. Patient reported that he still had his prescriptions from his recent discharge. The patient expressed concern over his lack of housing. [facility] who assisted patient was obtaining a bus pass in order to [facility] to receive assistance. Dalton Molina was receptive the this plan and left [facility] safely in no acute distress.   Musculoskeletal: Strength & Muscle Tone: within normal limits Gait & Station: normal Patient leans: Right  Psychiatric Specialty Exam: Physical Exam  Review of Systems  Psychiatric/Behavioral: Positive for  depression and substance abuse. Negative for hallucinations, memory loss and suicidal ideas. The patient is not nervous/anxious and does not have insomnia.   All other systems reviewed and are negative.   Blood pressure (!) 109/93, pulse 94, temperature 97.7 F (36.5 C), temperature source Oral, resp. rate 18, height 5\' 9"  (1.753 m), weight 114.3 kg (252 lb).Body mass index is 37.21 kg/m.  General Appearance: Casual and Fairly Groomed  Eye Contact:  Good  Speech:  Clear and Coherent  Volume:  Decreased  Mood:  Depressed  Affect:  Appropriate and Congruent  Thought Process:  Coherent  Orientation:  Full (Time, Place, and Person)  Thought Content:  WDL and Logical  Suicidal Thoughts:  No  Homicidal Thoughts:  No  Memory:  Immediate;   Good Recent;   Good Remote;   Fair  Judgement:  Fair  Insight:  Fair  Psychomotor Activity:  Decreased  Concentration:  Concentration: Good and Attention Span: Good  Recall:  [employer]  Fund of Knowledge:  Fair  Language:  Good  Akathisia:  No  Handed:  Right  AIMS (if indicated):     Assets:  Communication Skills Desire for Improvement Resilience  ADL's:  Intact  Cognition:  WNL  Sleep:   snores, sleep apnea        Has this patient used any form of tobacco in the last 30 days? (Cigarettes, Smokeless Tobacco, Cigars, and/or Pipes) Yes  Blood Alcohol level:  Lab Results  Component Value Date   ETH 65 (H) 09/05/2016   ETH <5 09/02/2016     Discharge destination:  Home, then [facility] on 09/09/16  Is patient on multiple antipsychotic therapies at discharge:  No   Has Patient had three or more failed trials of antipsychotic monotherapy by history:  No  Recommended Plan for Multiple Antipsychotic Therapies: NA     Medication List    TAKE these medications     Indication  albuterol 108 (90 Base) MCG/ACT inhaler Commonly known as:  PROVENTIL HFA;VENTOLIN HFA Inhale 2 puffs into the lungs every 6 (six) hours as needed for wheezing or  shortness of breath.  Indication:  Chronic Obstructive Lung Disease, sample - [facility] at 11:45AM   divalproex 500 MG DR tablet Commonly known as:  DEPAKOTE Take 1 tablet (500 mg total) by mouth 2 (two) times daily.  Indication:  Multiple Seizure Types   FLUoxetine 20 MG capsule Commonly known as:  PROZAC Take 1 capsule (20 mg total) by mouth daily.  Indication:  Depression   gabapentin 300 MG capsule Commonly known as:  NEURONTIN Take 1 capsule (300 mg total) by mouth 3 (three) times daily.  Indication:  Neuropathic Pain   nicotine 21 mg/24hr patch Commonly known as:  NICODERM CQ - dosed in mg/24 hours Place 1 patch (21 mg total) onto the skin daily.  Indication:  Nicotine Addiction   topiramate 50 MG tablet Commonly  known as:  TOPAMAX Take 1 tablet (50 mg total) by mouth 2 (two) times daily.  Indication:  Migraine Headache        Follow-up recommendations:  Other:  Follow up at [facility] on 09/09/16 at 11:45AM for clinical intake appointment  Pt discharged to home with outpatient resources in place for Monday 09/09/16 at [facility] for a clinical intake appointment.   SignedFransisca Kaufmann, NP 09/07/2016, 3:38 PM Reviewed with staff and agree with plan.

## 2016-09-07 NOTE — Progress Notes (Signed)
  Patient ID: Richelle ItoGuy Staunton, male   DOB: 31-May-1966, 50 y.o.   MRN: [id number]  States he is homeless, a sex offender and doesn't receive any income. He feels hopeless and is tired of living this way. He states he is still thinking of cutting self in an effort to kill self. He denies any support system. He has humor, is verbal and states he has been homeless since 2014. He is unsure if he will continue to live in this county but verbalizes no specific plans for the future. Waiting on the NP to eval patient for disposition.

## 2016-09-07 NOTE — Progress Notes (Signed)
10/21-This writer spoke with pt about his goals after discharge. [facility] This Clinical research associatewriter has provided a bus pass for his transportation. [facility]. Carmell AustriaAisha Aurianna Earlywine 11:04am

## 2016-09-07 NOTE — Progress Notes (Signed)
Patient ID: Dalton ItoGuy Molina, male   DOB: [birth year], 50 y.o.   MRN: [id number]  Discharged per Charisse MarchLaura D. NP after she evaluated him and determined he was safe for discharge. He was given a bus ticket and [facility] and [facility]. All of his property was returned to him and he was escorted to the lobby for discharge. He was able to contract for safety, offered dinner but he declined, accepted a soda and left the building.
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
## 2016-09-12 ENCOUNTER — Emergency Department (HOSPITAL_COMMUNITY)
Admission: EM | Admit: 2016-09-12 | Discharge: 2016-09-12 | Disposition: A | Discharge status: Home / Self Care | Payer: Self-pay | Attending: Emergency Medicine | Admitting: Emergency Medicine

## 2016-09-12 ENCOUNTER — Encounter (HOSPITAL_COMMUNITY): Payer: Self-pay

## 2016-09-12 ENCOUNTER — Emergency Department (HOSPITAL_COMMUNITY): Payer: Self-pay

## 2016-09-12 DIAGNOSIS — I4891 Unspecified atrial fibrillation: Secondary | ICD-10-CM | POA: Insufficient documentation

## 2016-09-12 DIAGNOSIS — G4739 Other sleep apnea: Secondary | ICD-10-CM | POA: Insufficient documentation

## 2016-09-12 DIAGNOSIS — G4730 Sleep apnea, unspecified: Secondary | ICD-10-CM

## 2016-09-12 DIAGNOSIS — F17210 Nicotine dependence, cigarettes, uncomplicated: Secondary | ICD-10-CM | POA: Insufficient documentation

## 2016-09-12 DIAGNOSIS — J449 Chronic obstructive pulmonary disease, unspecified: Secondary | ICD-10-CM | POA: Insufficient documentation

## 2016-09-12 LAB — CBC WITH DIFFERENTIAL/PLATELET
BASOS ABS: 0 10*3/uL (ref 0.0–0.1)
Basophils Relative: 0 %
EOS ABS: 0.5 10*3/uL (ref 0.0–0.7)
Eosinophils Relative: 4 %
HCT: 48.1 % (ref 39.0–52.0)
HEMOGLOBIN: 17 g/dL (ref 13.0–17.0)
LYMPHS PCT: 41 %
Lymphs Abs: 5.3 10*3/uL — ABNORMAL HIGH (ref 0.7–4.0)
MCH: 33.5 pg (ref 26.0–34.0)
MCHC: 35.3 g/dL (ref 30.0–36.0)
MCV: 94.7 fL (ref 78.0–100.0)
Monocytes Absolute: 1.4 10*3/uL — ABNORMAL HIGH (ref 0.1–1.0)
Monocytes Relative: 11 %
NEUTROS ABS: 5.8 10*3/uL (ref 1.7–7.7)
Neutrophils Relative %: 44 %
PLATELETS: 276 10*3/uL (ref 150–400)
RBC: 5.08 MIL/uL (ref 4.22–5.81)
RDW: 13.6 % (ref 11.5–15.5)
WBC: 13 10*3/uL — ABNORMAL HIGH (ref 4.0–10.5)

## 2016-09-12 LAB — BASIC METABOLIC PANEL
ANION GAP: 7 (ref 5–15)
BUN: 7 mg/dL (ref 6–20)
CALCIUM: 8.8 mg/dL — AB (ref 8.9–10.3)
CO2: 27 mmol/L (ref 22–32)
Chloride: 106 mmol/L (ref 101–111)
Creatinine, Ser: 1.12 mg/dL (ref 0.61–1.24)
GLUCOSE: 105 mg/dL — AB (ref 65–99)
Potassium: 4.3 mmol/L (ref 3.5–5.1)
Sodium: 140 mmol/L (ref 135–145)

## 2016-09-12 LAB — TROPONIN I

## 2016-09-12 LAB — VALPROIC ACID LEVEL: VALPROIC ACID LVL: 28 ug/mL — AB (ref 50.0–100.0)

## 2016-09-12 MED ORDER — LORAZEPAM 1 MG PO TABS: 0.0000 mg | ORAL_TABLET | Freq: Two times a day (BID) | ORAL | Status: DC

## 2016-09-12 MED ORDER — LORAZEPAM 1 MG PO TABS: 1.0000 mg | ORAL_TABLET | Freq: Three times a day (TID) | ORAL | Status: DC | PRN

## 2016-09-12 MED ORDER — ALBUTEROL SULFATE (2.5 MG/3ML) 0.083% IN NEBU: 5.0000 mg | INHALATION_SOLUTION | Freq: Once | RESPIRATORY_TRACT | Status: DC

## 2016-09-12 MED ORDER — ALBUTEROL SULFATE HFA 108 (90 BASE) MCG/ACT IN AERS: 2.0000 | INHALATION_SPRAY | Freq: Four times a day (QID) | RESPIRATORY_TRACT | Status: DC | PRN

## 2016-09-12 MED ORDER — NICOTINE 21 MG/24HR TD PT24: 21.0000 mg | MEDICATED_PATCH | Freq: Every day | TRANSDERMAL | Status: DC

## 2016-09-12 MED ORDER — FLUOXETINE HCL 20 MG PO CAPS
20.0000 mg | ORAL_CAPSULE | Freq: Every day | ORAL | Status: DC
  Administered 2016-09-12: 20 mg via ORAL
  Filled 2016-09-12: qty 1

## 2016-09-12 MED ORDER — LORAZEPAM 1 MG PO TABS: 0.0000 mg | ORAL_TABLET | Freq: Four times a day (QID) | ORAL | Status: DC

## 2016-09-12 MED ORDER — THIAMINE HCL 100 MG/ML IJ SOLN: 100.0000 mg | Freq: Every day | INTRAMUSCULAR | Status: DC

## 2016-09-12 MED ORDER — TOPIRAMATE 25 MG PO TABS
50.0000 mg | ORAL_TABLET | Freq: Two times a day (BID) | ORAL | Status: DC
  Administered 2016-09-12: 50 mg via ORAL
  Filled 2016-09-12: qty 2

## 2016-09-12 MED ORDER — GABAPENTIN 300 MG PO CAPS
300.0000 mg | ORAL_CAPSULE | Freq: Three times a day (TID) | ORAL | Status: DC
  Administered 2016-09-12: 300 mg via ORAL
  Filled 2016-09-12: qty 1

## 2016-09-12 MED ORDER — DIVALPROEX SODIUM 250 MG PO DR TAB
500.0000 mg | DELAYED_RELEASE_TABLET | Freq: Two times a day (BID) | ORAL | Status: DC
  Administered 2016-09-12: 500 mg via ORAL
  Filled 2016-09-12: qty 2

## 2016-09-12 MED ORDER — VITAMIN B-1 100 MG PO TABS
100.0000 mg | ORAL_TABLET | Freq: Every day | ORAL | Status: DC
  Administered 2016-09-12: 100 mg via ORAL
  Filled 2016-09-12: qty 1

## 2016-09-12 NOTE — ED Notes (Signed)
Sitter at bedside. Pt given purple scrubs.

## 2016-09-12 NOTE — ED Triage Notes (Signed)
[facility], pt has been having memory problems and SOB. Pt is IVC

## 2016-09-12 NOTE — ED Notes (Signed)
Pt on TTS at this time.  

## 2016-09-12 NOTE — ED Provider Notes (Signed)
[facility]-EMERGENCY DEPT Provider Note   CSN: [id number] Arrival date & time: 09/12/16  7829     History   Chief Complaint Chief Complaint  Patient presents with  . Shortness of Breath    HPI Dalton Molina is a 50 y.o. male.  HPI   50 year old male with history of alcohol abuse, sleep apnea, COPD, depression sent here from [facility] due to having shortness of breath while sleeping. Staff has noticed that while patient is sleeping, he stops breathing, and then occasionally gasping for air. Throughout the day he seems to have memory problems and not well rested. He is currently at [facility] for suicidal watch as well as alcoholic detox. Patient states that he has had sleep apnea for years but does not have a sleep Machine. He was told by his roommate several times in the past that he stop breathing at night sometimes and occasionally will be gasping for air. At this time he denies any shortness of breath. He endorses occasional cough productive with white sputum but no hemoptysis. Reportedly use an albuterol inhaler on occasion. He denies any history of fever, chills, nausea vomiting diarrhea, abdominal pain, leg pain. Denies any prior history of PE or DVT, no recent surgery, prolonged bed rest, active cancer. He does not have a known cardiac history, no history of CHF, hypertension, prior stroke, vascular disease or diabetes. He is actively trying to quit drinking alcohol. No prior history of A. fib.    Past Medical History:  Diagnosis Date  . Alcoholism /alcohol abuse (HCC)   . COPD (chronic obstructive pulmonary disease) (HCC)   . Depression   . GERD (gastroesophageal reflux disease)   . Osteoarthritis   . Scoliosis     Patient Active Problem List   Diagnosis Date Noted  . Substance induced mood disorder (HCC) 09/06/2016  . Alcohol abuse with alcohol-induced mood disorder (HCC) 09/03/2016  . Alcohol dependence (HCC) 08/04/2013  . Anxiety state, unspecified  08/04/2013  . Suicidal ideation 07/26/2013  . Depressive disorder 07/18/2013  . Alcohol abuse 07/18/2013  . S/P alcohol detoxification 07/18/2013    Past Surgical History:  Procedure Laterality Date  . APPENDECTOMY         Home Medications    Prior to Admission medications   Medication Sig Start Date End Date Taking? Authorizing Provider  albuterol (PROVENTIL HFA;VENTOLIN HFA) 108 (90 Base) MCG/ACT inhaler Inhale 2 puffs into the lungs every 6 (six) hours as needed for wheezing or shortness of breath. 09/05/16  Yes Laveda Abbe, NP  divalproex (DEPAKOTE) 500 MG DR tablet Take 1 tablet (500 mg total) by mouth 2 (two) times daily. 09/07/16  Yes Thermon Leyland, NP  FLUoxetine (PROZAC) 20 MG capsule Take 1 capsule (20 mg total) by mouth daily. 09/07/16  Yes Thermon Leyland, NP  gabapentin (NEURONTIN) 300 MG capsule Take 1 capsule (300 mg total) by mouth 3 (three) times daily. 09/07/16  Yes Thermon Leyland, NP  nicotine (NICODERM CQ - DOSED IN MG/24 HOURS) 21 mg/24hr patch Place 1 patch (21 mg total) onto the skin daily. 09/07/16  Yes Thermon Leyland, NP  topiramate (TOPAMAX) 50 MG tablet Take 1 tablet (50 mg total) by mouth 2 (two) times daily. 09/07/16  Yes Thermon Leyland, NP    Family History Family History  Problem Relation Age of Onset  . Cancer Mother     bladder  . Cancer Father     mesothelioma    Social History Social History  Substance Use Topics  . Smoking status: Current Every Day Smoker    Packs/day: 1.00    Years: 15.00    Types: Cigarettes  . Smokeless tobacco: Never Used     Comment: Refused cessation materials  . Alcohol use 50.4 oz/week    84 Cans of beer per week     Comment: reports is an alcoholic     Allergies   Percocet [oxycodone-acetaminophen]   Review of Systems Review of Systems  All other systems reviewed and are negative.    Physical Exam Updated Vital Signs BP 107/79 (BP Location: Right Arm)   Pulse (!) 136   Temp 97.8 F (36.6  C) (Oral)   Resp 13   SpO2 95%   Physical Exam  Constitutional: He appears well-developed and well-nourished. No distress.  HENT:  Head: Atraumatic.  Eyes: Conjunctivae are normal.  Neck: Neck supple. No JVD present.  Cardiovascular: Normal rate, regular rhythm and intact distal pulses.   Pulmonary/Chest: Effort normal and breath sounds normal. No respiratory distress. He has no wheezes.  Abdominal: Soft. There is no tenderness.  Musculoskeletal: He exhibits no edema.  Bilateral lower extremities without palpable cords, erythema, or edema.  Neurological: He is alert.  Skin: No rash noted.  Psychiatric: He has a normal mood and affect.  Nursing note and vitals reviewed.    ED Treatments / Results  Labs (all labs ordered are listed, but only abnormal results are displayed) Labs Reviewed  BASIC METABOLIC PANEL - Abnormal; Notable for the following:       Result Value   Glucose, Bld 105 (*)    Calcium 8.8 (*)    All other components within normal limits  CBC WITH DIFFERENTIAL/PLATELET - Abnormal; Notable for the following:    WBC 13.0 (*)    Lymphs Abs 5.3 (*)    Monocytes Absolute 1.4 (*)    All other components within normal limits  VALPROIC ACID LEVEL - Abnormal; Notable for the following:    Valproic Acid Lvl 28 (*)    All other components within normal limits  TROPONIN I    EKG  EKG Interpretation None     ED ECG REPORT   Date: 09/12/2016 @ 0442  Rate: 138  Rhythm: atrial fibrillation  QRS Axis: normal  Intervals: normal  ST/T Wave abnormalities: nonspecific ST/T changes  Conduction Disutrbances:none  Narrative Interpretation: new afib  Old EKG Reviewed: changes noted  I have personally reviewed the EKG tracing and agree with the computerized printout as noted.  ED ECG REPORT   Date: 09/12/2016 @ 0522  Rate: 72  Rhythm: normal sinus rhythm  QRS Axis: normal  Intervals: normal  ST/T Wave abnormalities: nonspecific ST/T changes  Conduction  Disutrbances:none  Narrative Interpretation: afib resolved  Old EKG Reviewed: changes noted  I have personally reviewed the EKG tracing and agree with the computerized printout as noted.    Radiology Dg Chest 2 View  Result Date: 09/12/2016 CLINICAL DATA:  50 year old male with chronic shortness of breath. COPD. EXAM: CHEST  2 VIEW COMPARISON:  Chest radiograph dated 09/08/2015 FINDINGS: There is emphysematous changes of the lungs. No focal consolidation, pleural effusion, or pneumothorax. The cardiac silhouette is within normal limits. No acute osseous pathology. IMPRESSION: No active cardiopulmonary disease. Electronically Signed   By: Elgie Collard M.D.   On: 09/12/2016 05:21    Procedures Procedures (including critical care time)  Medications Ordered in ED Medications - No data to display   Initial Impression / Assessment and  Plan / ED Course  I have reviewed the triage vital signs and the nursing notes.  Pertinent labs & imaging results that were available during my care of the patient were reviewed by me and considered in my medical decision making (see chart for details).  Clinical Course   BP 103/82 (BP Location: Right Arm)   Pulse 70   Temp 97.8 F (36.6 C) (Oral)   Resp 13   SpO2 95%    Final Clinical Impressions(s) / ED Diagnoses   Final diagnoses:  New onset a-fib (HCC)  Sleep apnea, unspecified type    New Prescriptions New Prescriptions   No medications on file   7:52 AM Pt with hx of COPD and sleep apnea, [facility] when staff noticed he was gasping for air while sleeping.  This is likely manifestation of his sleep apnea.  He did have new onset afib, which has spontaneously converted.  CHADVASC score of 0.    9:00 AM I have consulted with our afib clinic and spoke with nurse practitioner Lupita Leashonna, who does not recommend anticoagulant at this time.  Recommend pt to f/u in clinic on Weds, Nov 1st at 10am for further care.    Pt resting  comfortably, stable for discharge.  CXR unremarkable, mildly elevated WBC of 13, non specific.  Care discussed with Dr. Dalene SeltzerSchlossman.    9:22 AM Staff notified that [facility], therefore his bed was not hold for him to return.  He will need placement.  Will move to Pod C for holding until placement.    4:01 PM TTS has evaluated pt, and felt that pt is at baseline and have cleared pt to be discharge.  I encourage pt to [facility] for further evaulation of his afib.        Fayrene HelperBowie Damichael Hofman, PA-C 09/12/16 [id number]    Alvira MondayErin Schlossman, MD 09/12/16 2350

## 2016-09-12 NOTE — ED Notes (Signed)
Spoke with security to wand patient

## 2016-09-12 NOTE — ED Notes (Signed)
Pt ambulated to restroom. Changed into purple scrubs. Pt tolerated well.

## 2016-09-12 NOTE — ED Notes (Signed)
Pt changed into maroon scrubs and all belongings removed from room.  Pt has a sitter at bedside.

## 2016-09-12 NOTE — Discharge Instructions (Signed)
You have new onset atrial fibrillation.  Please follow up with the Atrial Fibrillation Clinic on Weds, Nov 1st for further evaluation and management of your condition.  Follow up with your doctor to seek further care for your sleep apnea.

## 2016-09-12 NOTE — ED Notes (Signed)
Pt Given a bus pass. And clothes along with discharge instructions. Pt upset stating his "whole day has been wasted". Pt asked if he wishes to speak to MD again pt refuses.

## 2016-09-12 NOTE — ED Notes (Signed)
[facility] did not hold pt's bed.  Notified Laveda Normanran, PA to start psychiatric process for placement.
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
## 2016-09-18 ENCOUNTER — Ambulatory Visit (HOSPITAL_COMMUNITY): Payer: Self-pay | Admitting: Nurse Practitioner

## 2016-09-27 ENCOUNTER — Emergency Department (HOSPITAL_COMMUNITY)
Admission: EM | Admit: 2016-09-27 | Discharge: 2016-09-29 | Disposition: A | Discharge status: Home / Self Care | Payer: Federal, State, Local not specified - Other | Attending: Emergency Medicine | Admitting: Emergency Medicine

## 2016-09-27 ENCOUNTER — Encounter (HOSPITAL_COMMUNITY): Payer: Self-pay | Admitting: Emergency Medicine

## 2016-09-27 DIAGNOSIS — F332 Major depressive disorder, recurrent severe without psychotic features: Secondary | ICD-10-CM

## 2016-09-27 DIAGNOSIS — F17210 Nicotine dependence, cigarettes, uncomplicated: Secondary | ICD-10-CM | POA: Insufficient documentation

## 2016-09-27 DIAGNOSIS — F1020 Alcohol dependence, uncomplicated: Secondary | ICD-10-CM | POA: Diagnosis present

## 2016-09-27 DIAGNOSIS — R45851 Suicidal ideations: Secondary | ICD-10-CM

## 2016-09-27 DIAGNOSIS — F329 Major depressive disorder, single episode, unspecified: Secondary | ICD-10-CM | POA: Insufficient documentation

## 2016-09-27 DIAGNOSIS — J449 Chronic obstructive pulmonary disease, unspecified: Secondary | ICD-10-CM | POA: Insufficient documentation

## 2016-09-27 DIAGNOSIS — Z79899 Other long term (current) drug therapy: Secondary | ICD-10-CM | POA: Insufficient documentation

## 2016-09-27 DIAGNOSIS — Z7982 Long term (current) use of aspirin: Secondary | ICD-10-CM | POA: Insufficient documentation

## 2016-09-27 LAB — COMPREHENSIVE METABOLIC PANEL
ALBUMIN: 4.7 g/dL (ref 3.5–5.0)
ALK PHOS: 66 U/L (ref 38–126)
ALT: 63 U/L (ref 17–63)
AST: 49 U/L — AB (ref 15–41)
Anion gap: 9 (ref 5–15)
BILIRUBIN TOTAL: 0.8 mg/dL (ref 0.3–1.2)
BUN: 18 mg/dL (ref 6–20)
CALCIUM: 9.6 mg/dL (ref 8.9–10.3)
CO2: 22 mmol/L (ref 22–32)
CREATININE: 1.1 mg/dL (ref 0.61–1.24)
Chloride: 106 mmol/L (ref 101–111)
GFR calc Af Amer: >60 mL/min (ref >60)
GLUCOSE: 95 mg/dL (ref 65–99)
Potassium: 4 mmol/L (ref 3.5–5.1)
Sodium: 137 mmol/L (ref 135–145)
TOTAL PROTEIN: 7.4 g/dL (ref 6.5–8.1)

## 2016-09-27 LAB — RAPID URINE DRUG SCREEN, HOSP PERFORMED
Amphetamines: NOT DETECTED
BARBITURATES: NOT DETECTED
BENZODIAZEPINES: POSITIVE — AB
COCAINE: NOT DETECTED
Opiates: NOT DETECTED
Tetrahydrocannabinol: NOT DETECTED

## 2016-09-27 LAB — CBC
HEMATOCRIT: 49.4 % (ref 39.0–52.0)
Hemoglobin: 18.2 g/dL — ABNORMAL HIGH (ref 13.0–17.0)
MCH: 34.3 pg — ABNORMAL HIGH (ref 26.0–34.0)
MCHC: 36.8 g/dL — ABNORMAL HIGH (ref 30.0–36.0)
MCV: 93 fL (ref 78.0–100.0)
PLATELETS: 275 10*3/uL (ref 150–400)
RBC: 5.31 MIL/uL (ref 4.22–5.81)
RDW: 13.1 % (ref 11.5–15.5)
WBC: 9.5 10*3/uL (ref 4.0–10.5)

## 2016-09-27 LAB — ETHANOL

## 2016-09-27 LAB — VALPROIC ACID LEVEL: Valproic Acid Lvl: <10 ug/mL — ABNORMAL LOW (ref 50.0–100.0)

## 2016-09-27 LAB — SALICYLATE LEVEL: Salicylate Lvl: <7 mg/dL (ref 2.8–30.0)

## 2016-09-27 LAB — ACETAMINOPHEN LEVEL

## 2016-09-27 MED ORDER — ALUM & MAG HYDROXIDE-SIMETH 200-200-20 MG/5ML PO SUSP: 30.0000 mL | ORAL | Status: DC | PRN

## 2016-09-27 MED ORDER — LORAZEPAM 1 MG PO TABS: 1.0000 mg | ORAL_TABLET | Freq: Three times a day (TID) | ORAL | Status: DC | PRN

## 2016-09-27 MED ORDER — NICOTINE 21 MG/24HR TD PT24
21.0000 mg | MEDICATED_PATCH | Freq: Every day | TRANSDERMAL | Status: DC
  Administered 2016-09-27 – 2016-09-29 (×3): 21 mg via TRANSDERMAL
  Filled 2016-09-27 (×3): qty 1

## 2016-09-27 MED ORDER — ZOLPIDEM TARTRATE 5 MG PO TABS: 5.0000 mg | ORAL_TABLET | Freq: Every evening | ORAL | Status: DC | PRN

## 2016-09-27 MED ORDER — ONDANSETRON HCL 4 MG PO TABS: 4.0000 mg | ORAL_TABLET | Freq: Three times a day (TID) | ORAL | Status: DC | PRN

## 2016-09-27 MED ORDER — DIVALPROEX SODIUM 500 MG PO DR TAB
500.0000 mg | DELAYED_RELEASE_TABLET | Freq: Two times a day (BID) | ORAL | Status: DC
  Administered 2016-09-27 – 2016-09-29 (×4): 500 mg via ORAL
  Filled 2016-09-27 (×4): qty 1

## 2016-09-27 MED ORDER — GABAPENTIN 300 MG PO CAPS
300.0000 mg | ORAL_CAPSULE | Freq: Three times a day (TID) | ORAL | Status: DC
  Administered 2016-09-27 – 2016-09-29 (×6): 300 mg via ORAL
  Filled 2016-09-27 (×6): qty 1

## 2016-09-27 MED ORDER — IBUPROFEN 200 MG PO TABS: 600.0000 mg | ORAL_TABLET | Freq: Three times a day (TID) | ORAL | Status: DC | PRN

## 2016-09-27 MED ORDER — FLUOXETINE HCL 20 MG PO CAPS
20.0000 mg | ORAL_CAPSULE | Freq: Every day | ORAL | Status: DC
  Administered 2016-09-27 – 2016-09-29 (×3): 20 mg via ORAL
  Filled 2016-09-27 (×3): qty 1

## 2016-09-27 MED ORDER — TOPIRAMATE 25 MG PO TABS
50.0000 mg | ORAL_TABLET | Freq: Two times a day (BID) | ORAL | Status: DC
  Administered 2016-09-27 – 2016-09-29 (×4): 50 mg via ORAL
  Filled 2016-09-27 (×4): qty 2
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
## 2016-09-27 NOTE — BH Assessment (Signed)
[facility] Assessment Progress Note  Case was staffed Arville CareParks NP who recommended patient be re-evaluated in the a.m.

## 2016-09-27 NOTE — ED Notes (Signed)
Patients belongings are in the TCU equipment room

## 2016-09-27 NOTE — ED Provider Notes (Signed)
[facility]-EMERGENCY DEPT Provider Note   CSN: [id number] Arrival date & time: 09/27/16  1519     History   Chief Complaint Chief Complaint  Patient presents with  . Medical Clearance    HPI Dalton ItoGuy Molina is a 50 y.o. male.  HPI   50 year old male with significant psychiatric history including depression, alcohol abuse, currently homeless who is a Registered sex offender presenting today with having suicidal thought. Patient states that he hates the current situation of his life. He declined to go into any more detail. States they have passive suicidal thoughts with occasional thought of hanging himself. States he is not well rested, having to wake up multiple times during the night he hasn't been compliant with his medication because he does not want to take it anymore. Admits history of alcohol abuse was denies any recent alcohol use. Denies recreational drug use. No homicidal ideation auditory or visual hallucination.    Past Medical History:  Diagnosis Date  . Alcoholism /alcohol abuse (HCC)   . COPD (chronic obstructive pulmonary disease) (HCC)   . Depression   . GERD (gastroesophageal reflux disease)   . Osteoarthritis   . Scoliosis     Patient Active Problem List   Diagnosis Date Noted  . Substance induced mood disorder (HCC) 09/06/2016  . Alcohol abuse with alcohol-induced mood disorder (HCC) 09/03/2016  . Alcohol dependence (HCC) 08/04/2013  . Anxiety state, unspecified 08/04/2013  . Suicidal ideation 07/26/2013  . Depressive disorder 07/18/2013  . Alcohol abuse 07/18/2013  . S/P alcohol detoxification 07/18/2013    Past Surgical History:  Procedure Laterality Date  . APPENDECTOMY         Home Medications    Prior to Admission medications   Medication Sig Start Date End Date Taking? Authorizing Provider  albuterol (PROVENTIL HFA;VENTOLIN HFA) 108 (90 Base) MCG/ACT inhaler Inhale 2 puffs into the lungs every 6 (six) hours as needed for wheezing or  shortness of breath. 09/05/16  Yes Laveda AbbeLaurie Britton Parks, NP  aspirin EC 81 MG tablet Take 81 mg by mouth daily.   Yes Historical Provider, MD  divalproex (DEPAKOTE) 500 MG DR tablet Take 1 tablet (500 mg total) by mouth 2 (two) times daily. 09/07/16  Yes Thermon LeylandLaura A Davis, NP  FLUoxetine (PROZAC) 20 MG capsule Take 1 capsule (20 mg total) by mouth daily. 09/07/16  Yes Thermon LeylandLaura A Davis, NP  gabapentin (NEURONTIN) 300 MG capsule Take 1 capsule (300 mg total) by mouth 3 (three) times daily. 09/07/16  Yes Thermon LeylandLaura A Davis, NP  topiramate (TOPAMAX) 50 MG tablet Take 1 tablet (50 mg total) by mouth 2 (two) times daily. 09/07/16  Yes Thermon LeylandLaura A Davis, NP    Family History Family History  Problem Relation Age of Onset  . Cancer Mother     bladder  . Cancer Father     mesothelioma    Social History Social History  Substance Use Topics  . Smoking status: Current Every Day Smoker    Packs/day: 1.00    Years: 15.00    Types: Cigarettes  . Smokeless tobacco: Never Used     Comment: Refused cessation materials  . Alcohol use 50.4 oz/week    84 Cans of beer per week     Comment: reports is an alcoholic     Allergies   Percocet [oxycodone-acetaminophen]   Review of Systems Review of Systems  All other systems reviewed and are negative.    Physical Exam Updated Vital Signs BP 134/96 (BP Location: Left Wrist)  Pulse 87   Temp 97.9 F (36.6 C) (Oral)   Resp 18   SpO2 93%   Physical Exam  Constitutional: He is oriented to person, place, and time. He appears well-developed and well-nourished. No distress.  HENT:  Head: Atraumatic.  Eyes: Conjunctivae are normal.  Neck: Neck supple.  Cardiovascular: Normal rate and regular rhythm.   Pulmonary/Chest: Effort normal and breath sounds normal.  Abdominal: Soft.  Neurological: He is alert and oriented to person, place, and time.  Skin: No rash noted.  Psychiatric: He has a normal mood and affect. His speech is normal and behavior is normal.  Thought content is not paranoid. He expresses suicidal ideation. He expresses no homicidal ideation.  Nursing note and vitals reviewed.    ED Treatments / Results  Labs (all labs ordered are listed, but only abnormal results are displayed) Labs Reviewed  COMPREHENSIVE METABOLIC PANEL - Abnormal; Notable for the following:       Result Value   AST 49 (*)    All other components within normal limits  ACETAMINOPHEN LEVEL - Abnormal; Notable for the following:    Acetaminophen (Tylenol), Serum <10 (*)    All other components within normal limits  CBC - Abnormal; Notable for the following:    Hemoglobin 18.2 (*)    MCH 34.3 (*)    MCHC 36.8 (*)    All other components within normal limits  RAPID URINE DRUG SCREEN, HOSP PERFORMED - Abnormal; Notable for the following:    Benzodiazepines POSITIVE (*)    All other components within normal limits  VALPROIC ACID LEVEL - Abnormal; Notable for the following:    Valproic Acid Lvl <10 (*)    All other components within normal limits  ETHANOL  SALICYLATE LEVEL    EKG  EKG Interpretation None       Radiology No results found.  Procedures Procedures (including critical care time)  Medications Ordered in ED Medications  LORazepam (ATIVAN) tablet 1 mg (not administered)  ibuprofen (ADVIL,MOTRIN) tablet 600 mg (not administered)  zolpidem (AMBIEN) tablet 5 mg (not administered)  nicotine (NICODERM CQ - dosed in mg/24 hours) patch 21 mg (21 mg Transdermal Patch Applied 09/27/16 1829)  ondansetron (ZOFRAN) tablet 4 mg (not administered)  alum & mag hydroxide-simeth (MAALOX/MYLANTA) 200-200-20 MG/5ML suspension 30 mL (not administered)  divalproex (DEPAKOTE) DR tablet 500 mg (not administered)  FLUoxetine (PROZAC) capsule 20 mg (20 mg Oral Given 09/27/16 1828)  gabapentin (NEURONTIN) capsule 300 mg (300 mg Oral Given 09/27/16 1828)  topiramate (TOPAMAX) tablet 50 mg (not administered)     Initial Impression / Assessment and Plan /  ED Course  I have reviewed the triage vital signs and the nursing notes.  Pertinent labs & imaging results that were available during my care of the patient were reviewed by me and considered in my medical decision making (see chart for details).  Clinical Course     BP 134/96 (BP Location: Left Wrist)   Pulse 87   Temp 97.9 F (36.6 C) (Oral)   Resp 18   SpO2 93%    Final Clinical Impressions(s) / ED Diagnoses   Final diagnoses:  Suicidal ideation    New Prescriptions New Prescriptions   No medications on file   5:51 PM Patient presents with suicidal ideation requesting for help. He is homeless, has been seen in the ED multiple times for similar complaint. He is well-appearing in no acute distress. Will consult TTS for further management. Patient is medically cleared.  Fayrene HelperBowie Efton Thomley, PA-C 09/27/16 1920    Linwood DibblesJon Knapp, MD 09/30/16 [phone]

## 2016-09-28 ENCOUNTER — Encounter (HOSPITAL_COMMUNITY): Payer: Self-pay | Admitting: Registered Nurse

## 2016-09-28 DIAGNOSIS — F332 Major depressive disorder, recurrent severe without psychotic features: Secondary | ICD-10-CM

## 2016-09-28 NOTE — ED Notes (Addendum)
Nursing Progress Note: 7p-7a Pt currently presents with a appropriate and cooperative behavior. Pt reports to writer that "I need to be here. I don't know what to do." Pt reports good sleep with current medication regimen. Pt provided with medications per provider's orders. Pt's labs and vitals were monitored throughout the night. Pt supported emotionally and encouraged to express concerns and questions. Pt educated on medications. Pt's safety ensured with 15 minute and environmental checks. Pt currently denies SI/HI/Self Harm and A/V hallucinations. Pt verbally agrees to seek staff if SI/HI or A/VH occurs and to consult with staff before acting on any harmful thoughts. Will continue POC.

## 2016-09-28 NOTE — ED Notes (Signed)
Nursing Progress Note: 7p-7a Pt currently presents with a flat affect and depressed behavior. Pt states [facility]. I ain't wanting to hurt myself, but I am very anxious and depressed both I'd rate a 8 out of 10." Pt reports good sleep with current medication regimen.  Pt provided with medications per providers orders. Pt's labs and vitals were monitored throughout the night. Pt supported emotionally and encouraged to express concerns and questions. Pt educated on medications. Pt's safety ensured with 15 minute and environmental checks. Pt currently denies SI/HI/Self Harm and A/V hallucinations. Pt verbally agrees to seek staff if SI/HI or A/VH occurs and to consult with staff before acting on any harmful thoughts. Will continue POC.

## 2016-09-28 NOTE — Progress Notes (Signed)
Pt A & O X4. Denies HI, AVH and pain at this time. Endorsed passive SI, verbally contracts for safety. Stated "I hate how people are treating the homeless population right now", when assessed. Reports depression related to his medical conditions (sleep apnea, COPD), legal issue (registered sex offender) and financial constraints. Emotional support and availability provided. All medications administered as prescribed with verbal education. Pt tolerated all PO intake well. Q 15 minutes safety checks maintained without self harm gestures thus far.

## 2016-09-28 NOTE — Consult Note (Signed)
[facility] Face-to-Face Psychiatry Consult   Reason for Consult:  Suicidal Ideation Referring Physician:  EDP Patient Identification: Dalton Molina MRN:  [id number] Principal Diagnosis: MDD (major depressive disorder), recurrent severe, without psychosis (HCC) Diagnosis:   Patient Active Problem List   Diagnosis Date Noted  . Substance induced mood disorder (HCC) [F19.94] 09/06/2016    Priority: Medium  . Suicidal ideation [R45.851] 07/26/2013    Priority: Medium  . MDD (major depressive disorder), recurrent severe, without psychosis (HCC) [F33.2] 09/28/2016  . Alcohol abuse with alcohol-induced mood disorder (HCC) [F10.14] 09/03/2016  . Alcohol dependence (HCC) [F10.20] 08/04/2013  . Anxiety state, unspecified [F41.1] 08/04/2013  . Depressive disorder [F32.9] 07/18/2013  . Alcohol abuse [F10.10] 07/18/2013  . S/P alcohol detoxification [Z09] 07/18/2013    Total Time spent with patient: 45 minutes  Subjective:   Dalton Molina is a 50 y.o. male patient.  HPI:  Patient present to [facility] with complaits of suicidal ideaton.  Patient seen by Dr. Ruthe Mannan and this provider.  Chart reviewed 09/28/16.   On evaluation:  Dalton Molina reports "I don't have no where else to go.  I'm just angry right now at myself and society.  So many people go homeless and there is no help.  There is a lot of mean selfish people and it just makes me angry.  I don't like people right now.  I'm just thankful for being in here right now."  Patient has had multiple admissions with the same complaint.  Patient has a history of as a child sex offender and has had difficulty finding places he can stay and unalbe to stay in local shelters.  After patient last admission to [facility] he left to stay in [facility] at [facility]; he would not elobrate on why he was no longer staying there.  Patient states that he has had anywhere to sleep and that he was tired and cold.  When asked what his plans were patient stated "There is  no where else for me to go other than to jail.  I guess I can just bash somebody in the head that would get me arrested or I can find a fake gun and just get the police to shot me."  Patient denies auditory/visual hallucinations and paranoia.  Accusations to harm someone so that he can get arrested or have the police to kill him (suicide by cop).     Past Psychiatric History: Prior inpatient and outpatient services.  Has been out of his medications for two weeks  Risk to Self: Suicidal Ideation: Yes-Currently Present Suicidal Intent: No Is patient at risk for suicide?: No Suicidal Plan?: Yes-Currently Present Specify Current Suicidal Plan: death by police Access to Means: No Specify Access to Suicidal Means: na What has been your use of drugs/alcohol within the last 12 months?: current ETOH use How many times?: 3 Other Self Harm Risks: none Triggers for Past Attempts: Other (Comment) (homeless) Intentional Self Injurious Behavior: None Comment - Self Injurious Behavior: na Risk to Others: Homicidal Ideation: No Thoughts of Harm to Others: No Current Homicidal Intent: No Current Homicidal Plan: No Access to Homicidal Means: No Identified Victim: na History of harm to others?: No Assessment of Violence: None Noted Violent Behavior Description: na Does patient have access to weapons?: No Criminal Charges Pending?: No Does patient have a court date: No Prior Inpatient Therapy: Prior Inpatient Therapy: Yes Prior Therapy Dates: 2017 Prior Therapy Facilty/Provider(s): [facility] Reason for Treatment: S/I MH issues Prior Outpatient Therapy: Prior Outpatient  Therapy: Yes Prior Therapy Dates: 2017 Prior Therapy Facilty/Provider(s): [facility] Reason for Treatment: SA issues Does patient have an ACCT team?: No Does patient have Intensive In-House Services?  : No Does patient have [facility] services? : Yes Does patient have P4CC services?: No  Past Medical History:  Past Medical History:   Diagnosis Date  . Alcoholism /alcohol abuse ([place])   . COPD (chronic obstructive pulmonary disease) ([place])   . Depression   . GERD (gastroesophageal reflux disease)   . Osteoarthritis   . Scoliosis     Past Surgical History:  Procedure Laterality Date  . APPENDECTOMY     Family History:  Family History  Problem Relation Age of Onset  . Cancer Mother     bladder  . Cancer Father     mesothelioma   Family Psychiatric  History: Denies  Social History:  History  Alcohol Use  . 50.4 oz/week  . 84 Cans of beer per week    Comment: reports is an alcoholic     History  Drug Use No    Comment: Denies use    Social History   Social History  . Marital status: Single    Spouse name: N/A  . Number of children: N/A  . Years of education: N/A   Social History Main Topics  . Smoking status: Current Every Day Smoker    Packs/day: 1.00    Years: 15.00    Types: Cigarettes  . Smokeless tobacco: Never Used     Comment: Refused cessation materials  . Alcohol use 50.4 oz/week    84 Cans of beer per week     Comment: reports is an alcoholic  . Drug use: No     Comment: Denies use  . Sexual activity: Yes   Other Topics Concern  . None   Social History Narrative  . None   Additional Social History:    Allergies:   Allergies  Allergen Reactions  . Percocet [Oxycodone-Acetaminophen] Anaphylaxis and Rash    Labs:  Results for orders placed or performed during the hospital encounter of 09/27/16 (from the past 48 hour(s))  Rapid urine drug screen (hospital performed)     Status: Abnormal   Collection Time: 09/27/16  3:40 PM  Result Value Ref Range   Opiates NONE DETECTED NONE DETECTED   Cocaine NONE DETECTED NONE DETECTED   Benzodiazepines POSITIVE (A) NONE DETECTED   Amphetamines NONE DETECTED NONE DETECTED   Tetrahydrocannabinol NONE DETECTED NONE DETECTED   Barbiturates NONE DETECTED NONE DETECTED    Comment:        DRUG SCREEN FOR MEDICAL PURPOSES ONLY.  IF  CONFIRMATION IS NEEDED FOR ANY PURPOSE, NOTIFY LAB WITHIN 5 DAYS.        LOWEST DETECTABLE LIMITS FOR URINE DRUG SCREEN Drug Class       Cutoff (ng/mL) Amphetamine      1000 Barbiturate      200 Benzodiazepine   062 Tricyclics       694 Opiates          300 Cocaine          300 THC              50   Comprehensive metabolic panel     Status: Abnormal   Collection Time: 09/27/16  3:50 PM  Result Value Ref Range   Sodium 137 135 - 145 mmol/L   Potassium 4.0 3.5 - 5.1 mmol/L   Chloride 106 101 - 111 mmol/L  CO2 22 22 - 32 mmol/L   Glucose, Bld 95 65 - 99 mg/dL   BUN 18 6 - 20 mg/dL   Creatinine, Ser 1.10 0.61 - 1.24 mg/dL   Calcium 9.6 8.9 - 10.3 mg/dL   Total Protein 7.4 6.5 - 8.1 g/dL   Albumin 4.7 3.5 - 5.0 g/dL   AST 49 (H) 15 - 41 U/L   ALT 63 17 - 63 U/L   Alkaline Phosphatase 66 38 - 126 U/L   Total Bilirubin 0.8 0.3 - 1.2 mg/dL   GFR calc non Af Amer >60 >60 mL/min   GFR calc Af Amer >60 >60 mL/min    Comment: (NOTE) The eGFR has been calculated using the CKD EPI equation. This calculation has not been validated in all clinical situations. eGFR's persistently <60 mL/min signify possible Chronic Kidney Disease.    Anion gap 9 5 - 15  Ethanol     Status: None   Collection Time: 09/27/16  3:50 PM  Result Value Ref Range   Alcohol, Ethyl (B) <5 <5 mg/dL    Comment:        LOWEST DETECTABLE LIMIT FOR SERUM ALCOHOL IS 5 mg/dL FOR MEDICAL PURPOSES ONLY   Salicylate level     Status: None   Collection Time: 09/27/16  3:50 PM  Result Value Ref Range   Salicylate Lvl <9.1 2.8 - 30.0 mg/dL  Acetaminophen level     Status: Abnormal   Collection Time: 09/27/16  3:50 PM  Result Value Ref Range   Acetaminophen (Tylenol), Serum <10 (L) 10 - 30 ug/mL    Comment:        THERAPEUTIC CONCENTRATIONS VARY SIGNIFICANTLY. A RANGE OF 10-30 ug/mL MAY BE AN EFFECTIVE CONCENTRATION FOR MANY PATIENTS. HOWEVER, SOME ARE BEST TREATED AT CONCENTRATIONS OUTSIDE  THIS RANGE. ACETAMINOPHEN CONCENTRATIONS >150 ug/mL AT 4 HOURS AFTER INGESTION AND >50 ug/mL AT 12 HOURS AFTER INGESTION ARE OFTEN ASSOCIATED WITH TOXIC REACTIONS.   cbc     Status: Abnormal   Collection Time: 09/27/16  3:50 PM  Result Value Ref Range   WBC 9.5 4.0 - 10.5 K/uL   RBC 5.31 4.22 - 5.81 MIL/uL   Hemoglobin 18.2 (H) 13.0 - 17.0 g/dL   HCT 49.4 39.0 - 52.0 %   MCV 93.0 78.0 - 100.0 fL   MCH 34.3 (H) 26.0 - 34.0 pg   MCHC 36.8 (H) 30.0 - 36.0 g/dL   RDW 13.1 11.5 - 15.5 %   Platelets 275 150 - 400 K/uL  Valproic acid level     Status: Abnormal   Collection Time: 09/27/16  3:50 PM  Result Value Ref Range   Valproic Acid Lvl <10 (L) 50.0 - 100.0 ug/mL    Comment: RESULTS CONFIRMED BY MANUAL DILUTION    Current Facility-Administered Medications  Medication Dose Route Frequency Provider Last Rate Last Dose  . alum & mag hydroxide-simeth (MAALOX/MYLANTA) 200-200-20 MG/5ML suspension 30 mL  30 mL Oral PRN Domenic Moras, PA-C      . divalproex (DEPAKOTE) DR tablet 500 mg  500 mg Oral BID Domenic Moras, PA-C   500 mg at 09/28/16 1008  . FLUoxetine (PROZAC) capsule 20 mg  20 mg Oral Daily Domenic Moras, PA-C   20 mg at 09/28/16 1007  . gabapentin (NEURONTIN) capsule 300 mg  300 mg Oral TID Domenic Moras, PA-C   300 mg at 09/28/16 1557  . ibuprofen (ADVIL,MOTRIN) tablet 600 mg  600 mg Oral Q8H PRN Domenic Moras, PA-C      .  LORazepam (ATIVAN) tablet 1 mg  1 mg Oral Q8H PRN Domenic Moras, PA-C      . nicotine (NICODERM CQ - dosed in mg/24 hours) patch 21 mg  21 mg Transdermal Daily Domenic Moras, PA-C   21 mg at 09/28/16 1010  . ondansetron (ZOFRAN) tablet 4 mg  4 mg Oral Q8H PRN Domenic Moras, PA-C      . topiramate (TOPAMAX) tablet 50 mg  50 mg Oral BID Domenic Moras, PA-C   50 mg at 09/28/16 1008  . zolpidem (AMBIEN) tablet 5 mg  5 mg Oral QHS PRN Domenic Moras, PA-C       Current Outpatient Prescriptions  Medication Sig Dispense Refill  . albuterol (PROVENTIL HFA;VENTOLIN HFA) 108 (90 Base) MCG/ACT  inhaler Inhale 2 puffs into the lungs every 6 (six) hours as needed for wheezing or shortness of breath. 1 Inhaler   . aspirin EC 81 MG tablet Take 81 mg by mouth daily.    . divalproex (DEPAKOTE) 500 MG DR tablet Take 1 tablet (500 mg total) by mouth 2 (two) times daily. 28 tablet 0  . FLUoxetine (PROZAC) 20 MG capsule Take 1 capsule (20 mg total) by mouth daily. 14 capsule 0  . gabapentin (NEURONTIN) 300 MG capsule Take 1 capsule (300 mg total) by mouth 3 (three) times daily. 42 capsule 0  . topiramate (TOPAMAX) 50 MG tablet Take 1 tablet (50 mg total) by mouth 2 (two) times daily. 28 tablet 0    Musculoskeletal: Strength & Muscle Tone: within normal limits Gait & Station: normal Patient leans: N/A  Psychiatric Specialty Exam: Physical Exam  Nursing note and vitals reviewed. Constitutional: He is oriented to person, place, and time.  Neck: Normal range of motion.  Respiratory: Effort normal.  Musculoskeletal: Normal range of motion.  Neurological: He is alert and oriented to person, place, and time.  Skin: Skin is warm and dry.  Psychiatric: His speech is normal. Cognition and memory are normal. He expresses impulsivity. He exhibits a depressed mood. He expresses suicidal ideation. He expresses suicidal plans.    ROS  Blood pressure 120/60, pulse 68, temperature 97.7 F (36.5 C), temperature source Oral, resp. rate 16, SpO2 97 %.There is no height or weight on file to calculate BMI.  General Appearance: Casual  Eye Contact:  Good  Speech:  Clear and Coherent and Normal Rate  Volume:  Normal  Mood:  Depressed  Affect:  Depressed  Thought Process:  Linear  Orientation:  Full (Time, Place, and Person)  Thought Content:  Illogical  Suicidal Thoughts:  Yes.  with intent/plan  Homicidal Thoughts:  No  Memory:  Immediate;   Good Recent;   Good Remote;   Good  Judgement:  Poor  Insight:  Lacking and Shallow  Psychomotor Activity:  Normal  Concentration:  Concentration: Fair and  Attention Span: Fair  Recall:  Good  Fund of Knowledge:  Good  Language:  Good  Akathisia:  No  Handed:  Right  AIMS (if indicated):     Assets:  Communication Skills  ADL's:  Intact  Cognition:  WNL  Sleep:        Treatment Plan Summary: Medication management and Plan Observation and reassess tomorrow   Patient has had multiple admission with same complaints; non-compliant with medication; Homeless and malingering for shelter, bed, and food.  Will reassess tomorrow and have Social Work/TTS to see if can find shelter that patient would be able to stay in.  Home medications restarted.  Disposition: Observation  Earleen Newport, NP 09/28/2016 4:57 PM  Patient seen, chart reviewed and case discussed with staff RN and physician extender and formulated treatment plan.Reviewed the information documented and agree with the treatment plan.   Carmelita Amparo [facility] 09/28/2016 5:17 PM

## 2016-09-29 DIAGNOSIS — F17210 Nicotine dependence, cigarettes, uncomplicated: Secondary | ICD-10-CM

## 2016-09-29 DIAGNOSIS — Z79899 Other long term (current) drug therapy: Secondary | ICD-10-CM

## 2016-09-29 DIAGNOSIS — R45851 Suicidal ideations: Secondary | ICD-10-CM

## 2016-09-29 DIAGNOSIS — F332 Major depressive disorder, recurrent severe without psychotic features: Secondary | ICD-10-CM | POA: Diagnosis not present

## 2016-09-29 DIAGNOSIS — Z888 Allergy status to other drugs, medicaments and biological substances status: Secondary | ICD-10-CM

## 2016-09-29 DIAGNOSIS — F1020 Alcohol dependence, uncomplicated: Secondary | ICD-10-CM

## 2016-09-29 DIAGNOSIS — Z808 Family history of malignant neoplasm of other organs or systems: Secondary | ICD-10-CM

## 2016-09-29 MED ORDER — ALUM & MAG HYDROXIDE-SIMETH 200-200-20 MG/5ML PO SUSP: 30.0000 mL | ORAL | 0 refills | Status: DC | PRN

## 2016-09-29 NOTE — Consult Note (Signed)
[facility] Face-to-Face Psychiatry Consult   Reason for Consult:  Suicidal Ideation Referring Physician:  EDP Patient Identification: Dalton Molina MRN:  [id number] Principal Diagnosis: MDD (major depressive disorder), recurrent severe, without psychosis ([place]) Diagnosis:   Patient Active Problem List   Diagnosis Date Noted  . Substance induced mood disorder ([place]) [F19.94] 09/06/2016    Priority: Medium  . Suicidal ideation [R45.851] 07/26/2013    Priority: Medium  . MDD (major depressive disorder), recurrent severe, without psychosis ([place]) [F33.2] 09/28/2016  . Alcohol abuse with alcohol-induced mood disorder ([place]) [F10.14] 09/03/2016  . Alcohol dependence ([place]) [F10.20] 08/04/2013  . Anxiety state, unspecified [F41.1] 08/04/2013  . Depressive disorder [F32.9] 07/18/2013  . Alcohol abuse [F10.10] 07/18/2013  . S/P alcohol detoxification [Z09] 07/18/2013    Total Time spent with patient: 30 minutes  Subjective:   Dalton Molina is a 50 y.o. male patient. Patient seen by Dr. Darleene Cleaver and this provider.  Chart reviewed 09/29/16.   On evaluation:  Dalton Molina reports that he is feeling better today.  Patient states that he stopped taking his medication once he was discharged from inpatient 1-2 weeks ago and did not follow up with resources that was given.  Patient states that he is homeless and just needed a place to stay. At this time patient denies suicidal/homicidal ideation, psychosis, and paranoia.  Patient encouraged to continue taking medications and follow up with resources given      Past Psychiatric History: Prior inpatient and outpatient services.  Has been out of his medications for two weeks  Risk to Self: Suicidal Ideation: Yes-Currently Present Suicidal Intent: No Is patient at risk for suicide?: No Suicidal Plan?: Yes-Currently Present Specify Current Suicidal Plan: death by police Access to Means: No Specify Access to Suicidal Means: na What has been your use of  drugs/alcohol within the last 12 months?: current ETOH use How many times?: 3 Other Self Harm Risks: none Triggers for Past Attempts: Other (Comment) (homeless) Intentional Self Injurious Behavior: None Comment - Self Injurious Behavior: na Risk to Others: Homicidal Ideation: No Thoughts of Harm to Others: No Current Homicidal Intent: No Current Homicidal Plan: No Access to Homicidal Means: No Identified Victim: na History of harm to others?: No Assessment of Violence: None Noted Violent Behavior Description: na Does patient have access to weapons?: No Criminal Charges Pending?: No Does patient have a court date: No Prior Inpatient Therapy: Prior Inpatient Therapy: Yes Prior Therapy Dates: 2017 Prior Therapy Facilty/Provider(s): [facility] Reason for Treatment: S/I MH issues Prior Outpatient Therapy: Prior Outpatient Therapy: Yes Prior Therapy Dates: 2017 Prior Therapy Facilty/Provider(s): [facility] Reason for Treatment: SA issues Does patient have an ACCT team?: No Does patient have Intensive In-House Services?  : No Does patient have [facility] services? : Yes Does patient have [facility] services?: No  Past Medical History:  Past Medical History:  Diagnosis Date  . Alcoholism /alcohol abuse ([place])   . COPD (chronic obstructive pulmonary disease) ([place])   . Depression   . GERD (gastroesophageal reflux disease)   . Osteoarthritis   . Scoliosis     Past Surgical History:  Procedure Laterality Date  . APPENDECTOMY     Family History:  Family History  Problem Relation Age of Onset  . Cancer Mother     bladder  . Cancer Father     mesothelioma   Family Psychiatric  History: Denies  Social History:  History  Alcohol Use  . 50.4 oz/week  . 84 Cans of beer per week  Comment: reports is an alcoholic     History  Drug Use No    Comment: Denies use    Social History   Social History  . Marital status: Single    Spouse name: N/A  . Number of children: N/A  . Years of  education: N/A   Social History Main Topics  . Smoking status: Current Every Day Smoker    Packs/day: 1.00    Years: 15.00    Types: Cigarettes  . Smokeless tobacco: Never Used     Comment: Refused cessation materials  . Alcohol use 50.4 oz/week    84 Cans of beer per week     Comment: reports is an alcoholic  . Drug use: No     Comment: Denies use  . Sexual activity: Yes   Other Topics Concern  . None   Social History Narrative  . None   Additional Social History:    Allergies:   Allergies  Allergen Reactions  . Percocet [Oxycodone-Acetaminophen] Anaphylaxis and Rash    Labs:  Results for orders placed or performed during the hospital encounter of 09/27/16 (from the past 48 hour(s))  Rapid urine drug screen (hospital performed)     Status: Abnormal   Collection Time: 09/27/16  3:40 PM  Result Value Ref Range   Opiates NONE DETECTED NONE DETECTED   Cocaine NONE DETECTED NONE DETECTED   Benzodiazepines POSITIVE (A) NONE DETECTED   Amphetamines NONE DETECTED NONE DETECTED   Tetrahydrocannabinol NONE DETECTED NONE DETECTED   Barbiturates NONE DETECTED NONE DETECTED    Comment:        DRUG SCREEN FOR MEDICAL PURPOSES ONLY.  IF CONFIRMATION IS NEEDED FOR ANY PURPOSE, NOTIFY LAB WITHIN 5 DAYS.        LOWEST DETECTABLE LIMITS FOR URINE DRUG SCREEN Drug Class       Cutoff (ng/mL) Amphetamine      1000 Barbiturate      200 Benzodiazepine   144 Tricyclics       818 Opiates          300 Cocaine          300 THC              50   Comprehensive metabolic panel     Status: Abnormal   Collection Time: 09/27/16  3:50 PM  Result Value Ref Range   Sodium 137 135 - 145 mmol/L   Potassium 4.0 3.5 - 5.1 mmol/L   Chloride 106 101 - 111 mmol/L   CO2 22 22 - 32 mmol/L   Glucose, Bld 95 65 - 99 mg/dL   BUN 18 6 - 20 mg/dL   Creatinine, Ser 1.10 0.61 - 1.24 mg/dL   Calcium 9.6 8.9 - 10.3 mg/dL   Total Protein 7.4 6.5 - 8.1 g/dL   Albumin 4.7 3.5 - 5.0 g/dL   AST 49 (H)  15 - 41 U/L   ALT 63 17 - 63 U/L   Alkaline Phosphatase 66 38 - 126 U/L   Total Bilirubin 0.8 0.3 - 1.2 mg/dL   GFR calc non Af Amer >60 >60 mL/min   GFR calc Af Amer >60 >60 mL/min    Comment: (NOTE) The eGFR has been calculated using the CKD EPI equation. This calculation has not been validated in all clinical situations. eGFR's persistently <60 mL/min signify possible Chronic Kidney Disease.    Anion gap 9 5 - 15  Ethanol     Status: None   Collection Time: 09/27/16  3:50 PM  Result Value Ref Range   Alcohol, Ethyl (B) <5 <5 mg/dL    Comment:        LOWEST DETECTABLE LIMIT FOR SERUM ALCOHOL IS 5 mg/dL FOR MEDICAL PURPOSES ONLY   Salicylate level     Status: None   Collection Time: 09/27/16  3:50 PM  Result Value Ref Range   Salicylate Lvl <2.5 2.8 - 30.0 mg/dL  Acetaminophen level     Status: Abnormal   Collection Time: 09/27/16  3:50 PM  Result Value Ref Range   Acetaminophen (Tylenol), Serum <10 (L) 10 - 30 ug/mL    Comment:        THERAPEUTIC CONCENTRATIONS VARY SIGNIFICANTLY. A RANGE OF 10-30 ug/mL MAY BE AN EFFECTIVE CONCENTRATION FOR MANY PATIENTS. HOWEVER, SOME ARE BEST TREATED AT CONCENTRATIONS OUTSIDE THIS RANGE. ACETAMINOPHEN CONCENTRATIONS >150 ug/mL AT 4 HOURS AFTER INGESTION AND >50 ug/mL AT 12 HOURS AFTER INGESTION ARE OFTEN ASSOCIATED WITH TOXIC REACTIONS.   cbc     Status: Abnormal   Collection Time: 09/27/16  3:50 PM  Result Value Ref Range   WBC 9.5 4.0 - 10.5 K/uL   RBC 5.31 4.22 - 5.81 MIL/uL   Hemoglobin 18.2 (H) 13.0 - 17.0 g/dL   HCT 49.4 39.0 - 52.0 %   MCV 93.0 78.0 - 100.0 fL   MCH 34.3 (H) 26.0 - 34.0 pg   MCHC 36.8 (H) 30.0 - 36.0 g/dL   RDW 13.1 11.5 - 15.5 %   Platelets 275 150 - 400 K/uL  Valproic acid level     Status: Abnormal   Collection Time: 09/27/16  3:50 PM  Result Value Ref Range   Valproic Acid Lvl <10 (L) 50.0 - 100.0 ug/mL    Comment: RESULTS CONFIRMED BY MANUAL DILUTION    Current Facility-Administered  Medications  Medication Dose Route Frequency Provider Last Rate Last Dose  . alum & mag hydroxide-simeth (MAALOX/MYLANTA) 200-200-20 MG/5ML suspension 30 mL  30 mL Oral PRN Domenic Moras, PA-C      . divalproex (DEPAKOTE) DR tablet 500 mg  500 mg Oral BID Domenic Moras, PA-C   500 mg at 09/29/16 0930  . FLUoxetine (PROZAC) capsule 20 mg  20 mg Oral Daily Domenic Moras, PA-C   20 mg at 09/29/16 0930  . gabapentin (NEURONTIN) capsule 300 mg  300 mg Oral TID Domenic Moras, PA-C   300 mg at 09/29/16 0930  . ibuprofen (ADVIL,MOTRIN) tablet 600 mg  600 mg Oral Q8H PRN Domenic Moras, PA-C      . LORazepam (ATIVAN) tablet 1 mg  1 mg Oral Q8H PRN Domenic Moras, PA-C      . nicotine (NICODERM CQ - dosed in mg/24 hours) patch 21 mg  21 mg Transdermal Daily Domenic Moras, PA-C   21 mg at 09/29/16 0932  . ondansetron (ZOFRAN) tablet 4 mg  4 mg Oral Q8H PRN Domenic Moras, PA-C      . topiramate (TOPAMAX) tablet 50 mg  50 mg Oral BID Domenic Moras, PA-C   50 mg at 09/29/16 0930  . zolpidem (AMBIEN) tablet 5 mg  5 mg Oral QHS PRN Domenic Moras, PA-C       Current Outpatient Prescriptions  Medication Sig Dispense Refill  . albuterol (PROVENTIL HFA;VENTOLIN HFA) 108 (90 Base) MCG/ACT inhaler Inhale 2 puffs into the lungs every 6 (six) hours as needed for wheezing or shortness of breath. 1 Inhaler   . aspirin EC 81 MG tablet Take 81 mg by mouth daily.    Marland Kitchen  divalproex (DEPAKOTE) 500 MG DR tablet Take 1 tablet (500 mg total) by mouth 2 (two) times daily. 28 tablet 0  . FLUoxetine (PROZAC) 20 MG capsule Take 1 capsule (20 mg total) by mouth daily. 14 capsule 0  . gabapentin (NEURONTIN) 300 MG capsule Take 1 capsule (300 mg total) by mouth 3 (three) times daily. 42 capsule 0  . topiramate (TOPAMAX) 50 MG tablet Take 1 tablet (50 mg total) by mouth 2 (two) times daily. 28 tablet 0    Musculoskeletal: Strength & Muscle Tone: within normal limits Gait & Station: normal Patient leans: N/A  Psychiatric Specialty Exam: Physical Exam  Nursing  note and vitals reviewed. Constitutional: He is oriented to person, place, and time.  Neck: Normal range of motion.  Respiratory: Effort normal.  Musculoskeletal: Normal range of motion.  Neurological: He is alert and oriented to person, place, and time.  Skin: Skin is warm and dry.  Psychiatric: His speech is normal. Cognition and memory are normal. He expresses impulsivity. He exhibits a depressed mood. He expresses suicidal ideation. He expresses suicidal plans.    Review of Systems  Psychiatric/Behavioral: Positive for depression (Stable) and substance abuse. Negative for hallucinations, memory loss and suicidal ideas. The patient has insomnia. The patient is not nervous/anxious.   All other systems reviewed and are negative.   Blood pressure 101/72, pulse 76, temperature 98.2 F (36.8 C), temperature source Oral, resp. rate 21, SpO2 95 %.There is no height or weight on file to calculate BMI.  General Appearance: Casual  Eye Contact:  Good  Speech:  Clear and Coherent and Normal Rate  Volume:  Normal  Mood:  Depressed  Affect:  Appropriate  Thought Process:  Linear  Orientation:  Full (Time, Place, and Person)  Thought Content:  Illogical  Suicidal Thoughts:  Yes.  with intent/plan  Homicidal Thoughts:  No  Memory:  Immediate;   Good Recent;   Good Remote;   Good  Judgement:  Poor  Insight:  Lacking and Shallow  Psychomotor Activity:  Normal  Concentration:  Concentration: Fair  Recall:  Good  Fund of Knowledge:  Good  Language:  Good  Akathisia:  No  Handed:  Right  AIMS (if indicated):     Assets:  Communication Skills  ADL's:  Intact  Cognition:  WNL  Sleep:        Treatment Plan Summary: Plan Discharge with outpatient resources  Disposition: No evidence of imminent risk to self or others at present.   Patient does not meet criteria for psychiatric inpatient admission.  Follow-up Information    [facility]. Go on 09/30/2016.   Specialty:  Behavioral  Health Why:  Arrive at 8:30am  Contact information: [place] [address] [phone]        [facility]. Go on 09/30/2016.   Contact information: [address], [place] [phone]  M-F 8am - 3pm and Sat/Sun 8am - 12pm           Earleen Newport, NP 09/29/2016 1:38 PM  Patient seen face-to-face for psychiatric evaluation, chart reviewed and case discussed with the physician extender and developed treatment plan. Reviewed the information documented and agree with the treatment plan. Corena Pilgrim, MD

## 2016-09-29 NOTE — Progress Notes (Signed)
CSW spoke with patient at bedside regarding following up with outpatient treatment upon discharge. CSW informed patient that Psychiatrist reccommended that patient follow up with [facility]. Patient reported that he is familiar with [facility]. CSW provided patient with handout about [facility] and contact information. CSW encouraged patient to follow up. CSW informed patient that the psychiatrist also recommended that patient follow up with the [facility] Patient reported that he was familiar with that resource. CSW provided patient with [facility] and hours of operation. CSW inquired if patient had any questions or concerns. Patient requested a bus pass upon discharge, CSW agreed.

## 2016-09-29 NOTE — [facility] Suicide Risk Assessment (Cosign Needed)
Suicide Risk Assessment  Discharge Assessment   [facility]   Principal Problem: MDD (major depressive disorder), recurrent severe, without psychosis ([facility]) Discharge Diagnoses:  Patient Active Problem List   Diagnosis Date Noted  . Substance induced mood disorder ([facility]) [F19.94] 09/06/2016    Priority: Medium  . Suicidal ideation [R45.851] 07/26/2013    Priority: Medium  . MDD (major depressive disorder), recurrent severe, without psychosis ([facility]) [F33.2] 09/28/2016  . Alcohol abuse with alcohol-induced mood disorder ([facility]) [F10.14] 09/03/2016  . Alcohol dependence ([facility]) [F10.20] 08/04/2013  . Anxiety state, unspecified [F41.1] 08/04/2013  . Depressive disorder [F32.9] 07/18/2013  . Alcohol abuse [F10.10] 07/18/2013  . S/P alcohol detoxification [Z09] 07/18/2013    Total Time spent with patient: 30 minutes  Musculoskeletal: Strength & Muscle Tone: within normal limits Gait & Station: normal Patient leans: N/A Psychiatric Specialty Exam: Physical Exam  Nursing note and vitals reviewed. Constitutional: He is oriented to person, place, and time.  Neck: Normal range of motion.  Respiratory: Effort normal.  Musculoskeletal: Normal range of motion.  Neurological: He is alert and oriented to person, place, and time.  Skin: Skin is warm and dry.  Psychiatric: His speech is normal. Cognition and memory are normal. He expresses impulsivity. He exhibits a depressed mood. He expresses suicidal ideation. He expresses suicidal plans.    Review of Systems  Psychiatric/Behavioral: Positive for depression (Stable) and substance abuse. Negative for hallucinations, memory loss and suicidal ideas. The patient has insomnia. The patient is not nervous/anxious.   All other systems reviewed and are negative.   Blood pressure 101/72, pulse 76, temperature 98.2 F (36.8 C), temperature source Oral, resp. rate 21, SpO2 95 %.There is no height or weight on file to calculate  BMI.  General Appearance: Casual  Eye Contact:  Good  Speech:  Clear and Coherent and Normal Rate  Volume:  Normal  Mood:  Depressed  Affect:  Appropriate  Thought Process:  Linear  Orientation:  Full (Time, Place, and Person)  Thought Content:  Illogical  Suicidal Thoughts:  Yes.  with intent/plan  Homicidal Thoughts:  No  Memory:  Immediate;   Good Recent;   Good Remote;   Good  Judgement:  Poor  Insight:  Lacking and Shallow  Psychomotor Activity:  Normal  Concentration:  Concentration: Fair  Recall:  Good  Fund of Knowledge:  Good  Language:  Good  Akathisia:  No  Handed:  Right  AIMS (if indicated):     Assets:  Communication Skills  ADL's:  Intact  Cognition:  WNL  Sleep:        Mental Status Per Nursing Assessment::   On Admission:     Demographic Factors:  Male, Caucasian and Living alone  Loss Factors: NA  Historical Factors: Impulsivity  Risk Reduction Factors:   NA  Continued Clinical Symptoms:  Alcohol/Substance Abuse/Dependencies  Cognitive Features That Contribute To Risk:  None    Suicide Risk:  Minimal: No identifiable suicidal ideation.  Patients presenting with no risk factors but with morbid ruminations; may be classified as minimal risk based on the severity of the depressive symptoms  [facility] Information    [facility]   Specialty:  Behavioral Health Why:  Arrive at 8:30am  Contact information: [address] [place] [address] [phone]        [facility]. Go on 09/30/2016.   Contact information: [address] [place], [place] [phone]  M-F 8am - 3pm and Sat/Sun 8am -  12pm          Plan Of Care/Follow-up recommendations:  Activity:  As tolerated Diet:  As tolerated  Allicia Culley, NP 09/29/2016, 2:14 PM

## 2017-02-06 ENCOUNTER — Emergency Department (HOSPITAL_COMMUNITY)
Admission: EM | Admit: 2017-02-06 | Discharge: 2017-02-06 | Disposition: A | Discharge status: Home / Self Care | Payer: Self-pay | Attending: Emergency Medicine | Admitting: Emergency Medicine

## 2017-02-06 ENCOUNTER — Emergency Department (HOSPITAL_COMMUNITY): Payer: Self-pay

## 2017-02-06 ENCOUNTER — Encounter (HOSPITAL_COMMUNITY): Payer: Self-pay

## 2017-02-06 DIAGNOSIS — Z7982 Long term (current) use of aspirin: Secondary | ICD-10-CM | POA: Insufficient documentation

## 2017-02-06 DIAGNOSIS — J40 Bronchitis, not specified as acute or chronic: Secondary | ICD-10-CM | POA: Insufficient documentation

## 2017-02-06 DIAGNOSIS — F17210 Nicotine dependence, cigarettes, uncomplicated: Secondary | ICD-10-CM | POA: Insufficient documentation

## 2017-02-06 DIAGNOSIS — J449 Chronic obstructive pulmonary disease, unspecified: Secondary | ICD-10-CM | POA: Insufficient documentation

## 2017-02-06 DIAGNOSIS — Z79899 Other long term (current) drug therapy: Secondary | ICD-10-CM | POA: Insufficient documentation

## 2017-02-06 LAB — BASIC METABOLIC PANEL
Anion gap: 8 (ref 5–15)
BUN: 10 mg/dL (ref 6–20)
CALCIUM: 9.2 mg/dL (ref 8.9–10.3)
CHLORIDE: 104 mmol/L (ref 101–111)
CO2: 26 mmol/L (ref 22–32)
CREATININE: 0.98 mg/dL (ref 0.61–1.24)
GFR calc non Af Amer: >60 mL/min (ref >60)
Glucose, Bld: 82 mg/dL (ref 65–99)
Potassium: 4.2 mmol/L (ref 3.5–5.1)
SODIUM: 138 mmol/L (ref 135–145)

## 2017-02-06 LAB — CBC
HCT: 46.9 % (ref 39.0–52.0)
Hemoglobin: 16.3 g/dL (ref 13.0–17.0)
MCH: 32.6 pg (ref 26.0–34.0)
MCHC: 34.8 g/dL (ref 30.0–36.0)
MCV: 93.8 fL (ref 78.0–100.0)
PLATELETS: 278 10*3/uL (ref 150–400)
RBC: 5 MIL/uL (ref 4.22–5.81)
RDW: 13 % (ref 11.5–15.5)
WBC: 9.8 10*3/uL (ref 4.0–10.5)

## 2017-02-06 LAB — I-STAT TROPONIN, ED: Troponin i, poc: 0 ng/mL (ref 0.00–0.08)

## 2017-02-06 MED ORDER — DOXYCYCLINE HYCLATE 100 MG PO CAPS: 100.0000 mg | ORAL_CAPSULE | Freq: Two times a day (BID) | ORAL | 0 refills | Status: DC

## 2017-02-06 MED ORDER — ALBUTEROL SULFATE (2.5 MG/3ML) 0.083% IN NEBU
5.0000 mg | INHALATION_SOLUTION | Freq: Once | RESPIRATORY_TRACT | Status: AC
Start: 2017-02-06 — End: 2017-02-06
  Administered 2017-02-06: 5 mg via RESPIRATORY_TRACT
  Filled 2017-02-06: qty 6

## 2017-02-06 MED ORDER — PREDNISONE 20 MG PO TABS: ORAL_TABLET | ORAL | 0 refills | Status: DC

## 2017-02-06 MED ORDER — ALBUTEROL SULFATE HFA 108 (90 BASE) MCG/ACT IN AERS
2.0000 | INHALATION_SPRAY | RESPIRATORY_TRACT | Status: DC | PRN
  Administered 2017-02-06: 2 via RESPIRATORY_TRACT
  Filled 2017-02-06: qty 6.7

## 2017-02-06 MED ORDER — PREDNISONE 20 MG PO TABS
60.0000 mg | ORAL_TABLET | Freq: Once | ORAL | Status: AC
  Administered 2017-02-06: 60 mg via ORAL
  Filled 2017-02-06: qty 3

## 2017-02-06 MED ORDER — DOXYCYCLINE HYCLATE 100 MG PO TABS
100.0000 mg | ORAL_TABLET | Freq: Once | ORAL | Status: AC
  Administered 2017-02-06: 100 mg via ORAL
  Filled 2017-02-06: qty 1

## 2017-02-06 NOTE — ED Triage Notes (Signed)
Pt reports productive cough, congestion, runny nose and chest tightness with exertion; onset one week ago. He reports he is homeless and has been sleeping outside in the cold and thinks maybe this is contributing to his symptoms.

## 2017-02-06 NOTE — ED Provider Notes (Signed)
[facility]PT Provider Note   CSN: [id number] Arrival date & time: 02/06/17  1216   By signing my name below, I, Dalton Molina, attest that this documentation has been prepared under the direction and in the presence of Rolan Bucco, MD. Electronically Signed: Soijett Molina, ED Scribe. 02/06/17. 1:56 PM.  History   Chief Complaint Chief Complaint  Patient presents with  . Cough    HPI Dalton Molina is a 51 y.o. male with a PMHx of COPD, A-fib, who presents to the Emergency Department complaining of productive cough x thick green sputum onset 1 week ago. Pt reports associated nasal congestion, rhinorrhea, chest tightness, and leg swelling. Pt has tried albuterol inhaler with no relief of his symptoms. He reports that he is currently homeless and has been sleeping outside and he thinks that is the cause of his symptoms. Pt reports that he has not been able to follow up with a cardiologist for his new onset A-fib that was diagnosed last year. He denies fever, vomiting, diarrhea, and any other symptoms. Pt PCP is Mee Hives, NP.    The history is provided by the patient. No language interpreter was used.    Past Medical History:  Diagnosis Date  . Alcoholism /alcohol abuse (HCC)   . COPD (chronic obstructive pulmonary disease) (HCC)   . Depression   . GERD (gastroesophageal reflux disease)   . Osteoarthritis   . Scoliosis     Patient Active Problem List   Diagnosis Date Noted  . MDD (major depressive disorder), recurrent severe, without psychosis (HCC) 09/28/2016  . Substance induced mood disorder (HCC) 09/06/2016  . Alcohol abuse with alcohol-induced mood disorder (HCC) 09/03/2016  . Alcohol dependence (HCC) 08/04/2013  . Anxiety state, unspecified 08/04/2013  . Suicidal ideation 07/26/2013  . Depressive disorder 07/18/2013  . Alcohol abuse 07/18/2013  . S/P alcohol detoxification 07/18/2013    Past Surgical History:  Procedure Laterality Date  . APPENDECTOMY          Home Medications    Prior to Admission medications   Medication Sig Start Date End Date Taking? Authorizing Provider  albuterol (PROVENTIL HFA;VENTOLIN HFA) 108 (90 Base) MCG/ACT inhaler Inhale 2 puffs into the lungs every 6 (six) hours as needed for wheezing or shortness of breath. 09/05/16   Laveda Abbe, NP  alum & mag hydroxide-simeth (MAALOX/MYLANTA) 200-200-20 MG/5ML suspension Take 30 mLs by mouth as needed for indigestion or heartburn. 09/29/16   Shuvon B Rankin, NP  aspirin EC 81 MG tablet Take 81 mg by mouth daily.    Historical Provider, MD  divalproex (DEPAKOTE) 500 MG DR tablet Take 1 tablet (500 mg total) by mouth 2 (two) times daily. 09/07/16   Thermon Leyland, NP  doxycycline (VIBRAMYCIN) 100 MG capsule Take 1 capsule (100 mg total) by mouth 2 (two) times daily. One po bid x 7 days 02/06/17   Rolan Bucco, MD  FLUoxetine (PROZAC) 20 MG capsule Take 1 capsule (20 mg total) by mouth daily. 09/07/16   Thermon Leyland, NP  gabapentin (NEURONTIN) 300 MG capsule Take 1 capsule (300 mg total) by mouth 3 (three) times daily. 09/07/16   Thermon Leyland, NP  predniSONE (DELTASONE) 20 MG tablet 2 tabs po daily x 4 days 02/06/17   Rolan Bucco, MD  topiramate (TOPAMAX) 50 MG tablet Take 1 tablet (50 mg total) by mouth 2 (two) times daily. 09/07/16   Thermon Leyland, NP    Family History Family History  Problem Relation Age of  Onset  . Cancer Mother     bladder  . Cancer Father     mesothelioma    Social History Social History  Substance Use Topics  . Smoking status: Current Every Day Smoker    Packs/day: 1.00    Years: 15.00    Types: Cigarettes  . Smokeless tobacco: Never Used     Comment: Refused cessation materials  . Alcohol use 50.4 oz/week    84 Cans of beer per week     Comment: reports is an alcoholic     Allergies   Percocet [oxycodone-acetaminophen]   Review of Systems Review of Systems  Constitutional: Negative for chills, diaphoresis, fatigue  and fever.  HENT: Positive for congestion and rhinorrhea. Negative for sneezing.   Eyes: Negative.   Respiratory: Positive for cough (productive) and chest tightness. Negative for shortness of breath.   Cardiovascular: Positive for leg swelling. Negative for chest pain.  Gastrointestinal: Negative for abdominal pain, blood in stool, diarrhea, nausea and vomiting.  Genitourinary: Negative for difficulty urinating, flank pain, frequency and hematuria.  Musculoskeletal: Negative for arthralgias and back pain.  Skin: Negative for rash.  Neurological: Negative for dizziness, speech difficulty, weakness, numbness and headaches.     Physical Exam Updated Vital Signs BP 102/79 (BP Location: Right Arm)   Pulse 100   Temp 98.2 F (36.8 C) (Oral)   Resp 16   SpO2 99%   Physical Exam  Constitutional: He is oriented to person, place, and time. He appears well-developed and well-nourished.  HENT:  Head: Normocephalic and atraumatic.  Eyes: Pupils are equal, round, and reactive to light.  Neck: Normal range of motion. Neck supple.  Cardiovascular: Normal rate, regular rhythm and normal heart sounds.   Pulmonary/Chest: Effort normal. No respiratory distress. He has wheezes. He has no rales. He exhibits no tenderness.  Diffuse wheezing in all lung fields with no increased work of breathing  Abdominal: Soft. Bowel sounds are normal. There is no tenderness. There is no rebound and no guarding.  Musculoskeletal: Normal range of motion.       Right lower leg: He exhibits edema.       Left lower leg: He exhibits edema.  Trace edema bilaterally to lower extremities.   Lymphadenopathy:    He has no cervical adenopathy.  Neurological: He is alert and oriented to person, place, and time.  Skin: Skin is warm and dry. No rash noted.  Psychiatric: He has a normal mood and affect.     ED Treatments / Results  DIAGNOSTIC STUDIES: Oxygen Saturation is 99% on RA, nl by my interpretation.     COORDINATION OF CARE: 1:55 PM Discussed treatment plan with pt at bedside which includes labs, CXR, EKG, breathing treatment, steroids, consult with social worker, and pt agreed to plan.   Labs (all labs ordered are listed, but only abnormal results are displayed) Labs Reviewed  BASIC METABOLIC PANEL  CBC  I-STAT TROPOININ, ED    EKG  EKG Interpretation  Date/Time:  Thursday February 06 2017 12:25:13 EDT Ventricular Rate:  91 PR Interval:  160 QRS Duration: 64 QT Interval:  360 QTC Calculation: 442 R Axis:   64 Text Interpretation:  Normal sinus rhythm with sinus arrhythmia Septal infarct , age undetermined Abnormal ECG Confirmed by Myleah Cavendish  MD, Santhosh Gulino ([id number]) on 02/06/2017 1:54:10 PM       Radiology Dg Chest 2 View  Result Date: 02/06/2017 CLINICAL DATA:  Congestion. EXAM: CHEST  2 VIEW COMPARISON:  09/12/2016. FINDINGS: Mediastinum and  hilar structures normal. Mild left base subsegmental atelectasis. No pleural effusion or pneumothorax. Heart size normal . No acute bony abnormality . IMPRESSION: Mild left base subsegmental atelectasis. Electronically Signed   By: Maisie Fushomas  Register   On: 02/06/2017 12:59    Procedures Procedures (including critical care time)  Medications Ordered in ED Medications  albuterol (PROVENTIL HFA;VENTOLIN HFA) 108 (90 Base) MCG/ACT inhaler 2 puff (2 puffs Inhalation Given 02/06/17 1456)  predniSONE (DELTASONE) tablet 60 mg (60 mg Oral Given 02/06/17 1358)  albuterol (PROVENTIL) (2.5 MG/3ML) 0.083% nebulizer solution 5 mg (5 mg Nebulization Given 02/06/17 1358)  doxycycline (VIBRA-TABS) tablet 100 mg (100 mg Oral Given 02/06/17 1359)     Initial Impression / Assessment and Plan / ED Course  I have reviewed the triage vital signs and the nursing notes.  Pertinent labs & imaging results that were available during my care of the patient were reviewed by me and considered in my medical decision making (see chart for details).     Patient presents  with cough and URI symptoms. He had wheezing on exam. This resolved after an albuterol neb. He was also given prednisone in the ED. Given his change in sputum with a history of COPD, will start him on doxycycline as well. There is no evidence of pneumonia. He has no hypoxia. His oxygen saturation is staying in the upper 90s on room air. He has no increased work of breathing. He was discharged home in good condition. Case manager has seen the patient and has advised him that he can [facility]. [facility]. He was given prescriptions for doxycycline and a four-day course of prednisone that he can start tomorrow. He was dispensed another albuterol MDI. Return precautions were given.  Final Clinical Impressions(s) / ED Diagnoses   Final diagnoses:  Bronchitis    New Prescriptions New Prescriptions   DOXYCYCLINE (VIBRAMYCIN) 100 MG CAPSULE    Take 1 capsule (100 mg total) by mouth 2 (two) times daily. One po bid x 7 days   PREDNISONE (DELTASONE) 20 MG TABLET    2 tabs po daily x 4 days   I personally performed the services described in this documentation, which was scribed in my presence.  The recorded information has been reviewed and considered.     Rolan BuccoMelanie Kayn Haymore, MD 02/06/17 [phone]

## 2017-02-06 NOTE — ED Notes (Signed)
Case mx into see pt

## 2017-02-06 NOTE — ED Notes (Signed)
Patient currently in xray ?

## 2017-02-06 NOTE — Discharge Planning (Signed)
[facility] consulted regarding medication assistance.  [facility] met with pt at bedside to discuss options.  Pt is active at [facility] and [facility].  EDCM advised pt to visit [facility] with Rx from today.  Pt understands importance of filling Rx upon discharge.

## 2017-03-04 ENCOUNTER — Ambulatory Visit (HOSPITAL_COMMUNITY): Payer: Self-pay | Admitting: Nurse Practitioner

## 2017-03-06 ENCOUNTER — Encounter (HOSPITAL_COMMUNITY): Payer: Self-pay | Admitting: Nurse Practitioner

## 2017-03-06 ENCOUNTER — Ambulatory Visit (HOSPITAL_COMMUNITY)
Admission: RE | Admit: 2017-03-06 | Discharge: 2017-03-06 | Disposition: A | Discharge status: Home / Self Care | Payer: Self-pay | Source: Ambulatory Visit | Attending: Nurse Practitioner | Admitting: Nurse Practitioner

## 2017-03-06 VITALS — BP 110/78 | HR 85 | Ht 69.0 in | Wt 249.8 lb

## 2017-03-06 DIAGNOSIS — K219 Gastro-esophageal reflux disease without esophagitis: Secondary | ICD-10-CM | POA: Insufficient documentation

## 2017-03-06 DIAGNOSIS — I480 Paroxysmal atrial fibrillation: Secondary | ICD-10-CM

## 2017-03-06 DIAGNOSIS — F17210 Nicotine dependence, cigarettes, uncomplicated: Secondary | ICD-10-CM | POA: Insufficient documentation

## 2017-03-06 DIAGNOSIS — Z79899 Other long term (current) drug therapy: Secondary | ICD-10-CM | POA: Insufficient documentation

## 2017-03-06 DIAGNOSIS — J449 Chronic obstructive pulmonary disease, unspecified: Secondary | ICD-10-CM | POA: Insufficient documentation

## 2017-03-06 NOTE — Patient Instructions (Signed)
Follow up with CPAP titration  Continue to avoid alcohol and reduce smoking.

## 2017-03-06 NOTE — Progress Notes (Signed)
Primary Care Physician:Jacklynn BarnacleRY H, NP Referring Physician: [facility] f/u   Dalton Molina is a 51 y.o. male with a h/o alcoholism, tobacco abuse, homelessness, previous suicidal ideations that is in  the afib clinic for f/u of afib initially dx in fall of 2017. He states that he was being treated  at [facility] suicide watch and alcohol detox, where he was observed snoring with apnea spells and brought to the ER and also found to be in afib. He was asked to come here months ago but failed to come. He now has a case Production designer, theatre/television/film that is here with him today. He has dramatically reduced his alcohol intake but I am not convinced that he has completely quit drinking. At his heaviness, drinking 12 beers a day and more on the weekend. He does continue to smoke. He states that he had a sleep study that did show sleep apnea but never went back for cpap titration trial.   Today, he denies symptoms of palpitations, chest pain, shortness of breath, orthopnea, PND, lower extremity edema, dizziness, presyncope, syncope, or neurologic sequela. The patient is tolerating medications without difficulties and is otherwise without complaint today.   Past Medical History:  Diagnosis Date  . Alcoholism /alcohol abuse (HCC)   . COPD (chronic obstructive pulmonary disease) (HCC)   . Depression   . GERD (gastroesophageal reflux disease)   . Osteoarthritis   . Scoliosis    Past Surgical History:  Procedure Laterality Date  . APPENDECTOMY      Current Outpatient Prescriptions  Medication Sig Dispense Refill  . albuterol (PROVENTIL HFA;VENTOLIN HFA) 108 (90 Base) MCG/ACT inhaler Inhale 2 puffs into the lungs every 6 (six) hours as needed for wheezing or shortness of breath. 1 Inhaler   . divalproex (DEPAKOTE) 500 MG DR tablet Take 1 tablet (500 mg total) by mouth 2 (two) times daily. 28 tablet 0  . gabapentin (NEURONTIN) 300 MG capsule Take 1 capsule (300 mg total) by mouth 3 (three) times daily. 42 capsule 0   No  current facility-administered medications for this encounter.     Allergies  Allergen Reactions  . Percocet [Oxycodone-Acetaminophen] Anaphylaxis and Rash    Social History   Social History  . Marital status: Single    Spouse name: N/A  . Number of children: N/A  . Years of education: N/A   Occupational History  . Not on file.   Social History Main Topics  . Smoking status: Current Every Day Smoker    Packs/day: 1.00    Years: 15.00    Types: Cigarettes  . Smokeless tobacco: Never Used     Comment: Refused cessation materials  . Alcohol use 50.4 oz/week    84 Cans of beer per week     Comment: reports is an alcoholic  . Drug use: No     Comment: Denies use  . Sexual activity: Yes   Other Topics Concern  . Not on file   Social History Narrative  . No narrative on file    Family History  Problem Relation Age of Onset  . Cancer Mother     bladder  . Cancer Father     mesothelioma    ROS- All systems are reviewed and negative except as per the HPI above  Physical Exam: Vitals:   03/06/17 1414  BP: 110/78  Pulse: 85  Weight: 249 lb 12.8 oz (113.3 kg)  Height:  (1.753 m)   Wt Readings from Last 3 Encounters:  03/06/17 249  lb 12.8 oz (113.3 kg)  09/02/16 250 lb (113.4 kg)  09/08/15 239 lb (108.4 kg)    Labs: Lab Results  Component Value Date   NA 138 02/06/2017   K 4.2 02/06/2017   CL 104 02/06/2017   CO2 26 02/06/2017   GLUCOSE 82 02/06/2017   BUN 10 02/06/2017   CREATININE 0.98 02/06/2017   CALCIUM 9.2 02/06/2017   No results found for: INR No results found for: CHOL, HDL, LDLCALC, TRIG   GEN- The patient is well appearing, alert and oriented x 3 today.   Head- normocephalic, atraumatic Eyes-  Sclera clear, conjunctiva pink Ears- hearing intact Oropharynx- clear Neck- supple, no JVP Lymph- no cervical lymphadenopathy Lungs- Clear to ausculation bilaterally, normal work of breathing Heart- Regular rate and rhythm, no murmurs, rubs  or gallops, PMI not laterally displaced GI- soft, NT, ND, + BS Extremities- no clubbing, cyanosis, or edema MS- no significant deformity or atrophy Skin- no rash or lesion Psych- euthymic mood, full affect Neuro- strength and sensation are intact  EKG- NSR at 85 bpm, pr int 162 ms, qrs int 76 ms, qtc 440 ms Epic records reviewed    Assessment and Plan: 1.Paroxysmal atrial fibrillation Currently in SR Sounds like he has had a very low afib burden, feels flutters very rarely, lasting just a few minutes Will not add rate control since very asymptomatic  Chadsvasc score is 0, does not require anticoagulation at this time Echo Encouraged to follow up with sleep study group and schedule for cpap titration trial Alcohol abstinence, encouraged to stop smoking  f/u afib clinic in one month  Lupita Leash C. Matthew Fol[facility] [address] [place], [place] [address] [phone]

## 2017-03-13 ENCOUNTER — Emergency Department (HOSPITAL_COMMUNITY)
Admission: EM | Admit: 2017-03-13 | Discharge: 2017-03-13 | Disposition: A | Discharge status: Home / Self Care | Payer: Self-pay | Attending: Emergency Medicine | Admitting: Emergency Medicine

## 2017-03-13 ENCOUNTER — Encounter (HOSPITAL_COMMUNITY): Payer: Self-pay

## 2017-03-13 ENCOUNTER — Emergency Department (HOSPITAL_COMMUNITY): Payer: Self-pay

## 2017-03-13 DIAGNOSIS — J449 Chronic obstructive pulmonary disease, unspecified: Secondary | ICD-10-CM | POA: Insufficient documentation

## 2017-03-13 DIAGNOSIS — F17210 Nicotine dependence, cigarettes, uncomplicated: Secondary | ICD-10-CM | POA: Insufficient documentation

## 2017-03-13 DIAGNOSIS — M7711 Lateral epicondylitis, right elbow: Secondary | ICD-10-CM | POA: Insufficient documentation

## 2017-03-13 HISTORY — DX: Unspecified atrial fibrillation: I48.91

## 2017-03-13 MED ORDER — DICLOFENAC SODIUM 1 % TD GEL: 2.0000 g | Freq: Four times a day (QID) | TRANSDERMAL | 0 refills | Status: DC

## 2017-03-13 MED ORDER — PREDNISONE 10 MG PO TABS: 20.0000 mg | ORAL_TABLET | Freq: Two times a day (BID) | ORAL | 0 refills | Status: AC

## 2017-03-13 NOTE — ED Notes (Signed)
Pt is in stable condition upon d/c and ambulates from ED. 

## 2017-03-13 NOTE — ED Triage Notes (Signed)
Pt presents to the ed with complaints of pain in his right elbow and wrist that has been going on, on and off for two years, states that it flares up at least once a month.  Reports using scissors a lot yesterday and woke up with it aching really bad. Cms intact.

## 2017-03-13 NOTE — ED Notes (Signed)
Patient transported to X-ray 

## 2017-03-13 NOTE — Discharge Instructions (Signed)
You have been seen today for arm pain. There were no acute abnormalities on the x-rays, including no sign of fracture or dislocation. Pain: Take 440 mg of naproxen every 12 hours or 600 mg of ibuprofen every 6 hours for the next 3 days. Take these types of medications with food to avoid upset stomach. Choose one of these medications, but not both.  You may also try using the diclofenac gel applied to the painful area. Ice: May apply ice to the area over the next 24 hours for 15 minutes at a time to reduce swelling. Elevation: Keep the extremity elevated as often as possible to reduce pain and inflammation. Sling: Wear the sling only while upright for support and comfort. Wear this until pain resolves. Exercises: Start by performing these exercises a few times a week, increasing the frequency until you are performing them twice daily.  Follow up: Follow up with the hand specialist as soon as possible. Call the number provided to set up an appointment.

## 2017-03-13 NOTE — ED Provider Notes (Signed)
[facility] Provider Note   CSN: [id number] Arrival date & time: 03/13/17  1128   By signing my name below, I, Talbert Nan, attest that this documentation has been prepared under the direction and in the presence of Shawn Joy, PA-C. Electronically Signed: Talbert Nan, Scribe. 03/13/17. 12:59 PM.    History   Chief Complaint Chief Complaint  Patient presents with  . Arm Pain    HPI Dalton Molina is a 51 y.o. male who presents to the Emergency Department complaining of Recurrent right arm pain that recurs with activity over the last few years. Patient states that this particular time he was doing arts and crafts for an extended period of time yesterday and began to have pain from his right elbow into his forearm, wrist, and hand. He woke up this morning with associated swelling. Patient has never been evaluated for this complaint. His current pain is moderate and achy. Endorses an injury in the distant past to this extremity. He has not taken any medications for his complaint. He denies neuro deficits, acute injury, or any other complaints. Patient is requesting x-ray evaluation.  The history is provided by the patient. No language interpreter was used.    Past Medical History:  Diagnosis Date  . Alcoholism /alcohol abuse (HCC)   . Atrial fibrillation (HCC)   . COPD (chronic obstructive pulmonary disease) (HCC)   . Depression   . GERD (gastroesophageal reflux disease)   . Osteoarthritis   . Scoliosis     Patient Active Problem List   Diagnosis Date Noted  . MDD (major depressive disorder), recurrent severe, without psychosis (HCC) 09/28/2016  . Substance induced mood disorder (HCC) 09/06/2016  . Alcohol abuse with alcohol-induced mood disorder (HCC) 09/03/2016  . Alcohol dependence (HCC) 08/04/2013  . Anxiety state, unspecified 08/04/2013  . Suicidal ideation 07/26/2013  . Depressive disorder 07/18/2013  . Alcohol abuse 07/18/2013  . S/P alcohol detoxification  07/18/2013    Past Surgical History:  Procedure Laterality Date  . APPENDECTOMY         Home Medications    Prior to Admission medications   Medication Sig Start Date End Date Taking? Authorizing Provider  albuterol (PROVENTIL HFA;VENTOLIN HFA) 108 (90 Base) MCG/ACT inhaler Inhale 2 puffs into the lungs every 6 (six) hours as needed for wheezing or shortness of breath. 09/05/16   Laveda Abbe, NP  diclofenac sodium (VOLTAREN) 1 % GEL Apply 2 g topically 4 (four) times daily. 03/13/17   Shawn C Joy, PA-C  divalproex (DEPAKOTE) 500 MG DR tablet Take 1 tablet (500 mg total) by mouth 2 (two) times daily. 09/07/16   Thermon Leyland, NP  gabapentin (NEURONTIN) 300 MG capsule Take 1 capsule (300 mg total) by mouth 3 (three) times daily. 09/07/16   Thermon Leyland, NP  predniSONE (DELTASONE) 10 MG tablet Take 2 tablets (20 mg total) by mouth 2 (two) times daily with a meal. 03/13/17 03/18/17  Anselm Pancoast, PA-C    Family History Family History  Problem Relation Age of Onset  . Cancer Mother     bladder  . Cancer Father     mesothelioma    Social History Social History  Substance Use Topics  . Smoking status: Current Every Day Smoker    Packs/day: 1.00    Years: 15.00    Types: Cigarettes  . Smokeless tobacco: Never Used     Comment: Refused cessation materials  . Alcohol use 50.4 oz/week    84 Cans of beer  per week     Comment: reports is an alcoholic     Allergies   Percocet [oxycodone-acetaminophen]   Review of Systems Review of Systems  Musculoskeletal: Positive for arthralgias and myalgias.  Skin: Negative for wound.  Neurological: Negative for weakness and numbness.     Physical Exam Updated Vital Signs BP 128/86   Pulse 98   Temp 97.8 F (36.6 C) (Oral)   Resp 18   Ht  (1.753 m)   Wt 248 lb (112.5 kg)   SpO2 98%   BMI 36.62 kg/m   Physical Exam  Constitutional: He appears well-developed and well-nourished. No distress.  HENT:  Head:  Normocephalic and atraumatic.  Eyes: Conjunctivae are normal.  Neck: Neck supple.  Cardiovascular: Normal rate, regular rhythm and intact distal pulses.   Pulmonary/Chest: Effort normal.  Musculoskeletal: He exhibits edema and tenderness.  Tapping over right lateral epicondyle elicits pain extending distally into the hand. Pain with flexion and extension of the fingers. Flexion and extension of the wrist elicits pain, distally as well as proximally.  Neurological: He is alert.  No noted sensory deficits. Strength 5 out of 5 in the bilateral upper extremities.  Skin: Skin is warm and dry. Capillary refill takes less than 2 seconds. He is not diaphoretic.  Psychiatric: He has a normal mood and affect. His behavior is normal.  Nursing note and vitals reviewed.    ED Treatments / Results   DIAGNOSTIC STUDIES: Oxygen Saturation is 98% on room air, normal by my interpretation.    COORDINATION OF CARE: 12:58 PM Discussed treatment plan with pt at bedside and pt agreed to plan.    Labs (all labs ordered are listed, but only abnormal results are displayed) Labs Reviewed - No data to display  EKG  EKG Interpretation None       Radiology Dg Elbow Complete Right  Result Date: 03/13/2017 CLINICAL DATA:  Chronic right elbow pain following accident many years ago. Initial encounter. EXAM: RIGHT ELBOW - COMPLETE 3+ VIEW COMPARISON:  None. FINDINGS: No acute fracture, subluxation or dislocation identified. There is no evidence of joint effusion. Mild degenerative changes at the humeral ulnar joint noted. An olecranon spur is present. IMPRESSION: No acute abnormality. Mild degenerative changes. Electronically Signed   By: Harmon Pier M.D.   On: 03/13/2017 13:45   Dg Hand Complete Right  Result Date: 03/13/2017 CLINICAL DATA:  Chronic right middle finger pain following accident many years ago. Initial encounter. EXAM: RIGHT HAND - COMPLETE 3+ VIEW COMPARISON:  None. FINDINGS: No acute  fracture, subluxation or dislocation identified. A tiny metallic foreign body in the soft tissues of the tip of the middle finger is identified. The joint spaces are unremarkable. No erosive changes are present. A remote fifth metacarpal fracture is noted. IMPRESSION: No acute abnormality or significant joint abnormality. Tiny metallic foreign body in the soft tissues of the tip of the middle finger -likely chronic. Electronically Signed   By: Harmon Pier M.D.   On: 03/13/2017 13:44    Procedures Procedures (including critical care time)  Medications Ordered in ED Medications - No data to display   Initial Impression / Assessment and Plan / ED Course  I have reviewed the triage vital signs and the nursing notes.  Pertinent labs & imaging results that were available during my care of the patient were reviewed by me and considered in my medical decision making (see chart for details).     Patient presents with complaint of recurrent  right arm pain. Lateral epicondylitis is a possible diagnosis. Plan includes NSAIDs, steroids, home physical therapy, orthopedic follow up. Xrays without acute abnormality. The patient was given instructions for home care as well as return precautions. Patient voices understanding of these instructions, accepts the plan, and is comfortable with discharge.   Final Clinical Impressions(s) / ED Diagnoses   Final diagnoses:  Lateral epicondylitis of right elbow    New Prescriptions Discharge Medication List as of 03/13/2017  2:21 PM    START taking these medications   Details  diclofenac sodium (VOLTAREN) 1 % GEL Apply 2 g topically 4 (four) times daily., Starting Thu 03/13/2017, Print    predniSONE (DELTASONE) 10 MG tablet Take 2 tablets (20 mg total) by mouth 2 (two) times daily with a meal., Starting Thu 03/13/2017, Until Tue 03/18/2017, Print       I personally performed the services described in this documentation, which was scribed in my presence. The  recorded information has been reviewed and is accurate.    Anselm Pancoast, PA-C 03/13/17 1809    Lyndal Pulley, MD 03/13/17 Mikle Bosworth

## 2017-03-14 ENCOUNTER — Ambulatory Visit (HOSPITAL_COMMUNITY): Payer: Self-pay

## 2017-03-24 ENCOUNTER — Ambulatory Visit (HOSPITAL_COMMUNITY)
Admission: RE | Admit: 2017-03-24 | Discharge: 2017-03-24 | Disposition: A | Discharge status: Home / Self Care | Payer: Self-pay | Source: Ambulatory Visit | Attending: Nurse Practitioner | Admitting: Nurse Practitioner

## 2017-03-24 DIAGNOSIS — I480 Paroxysmal atrial fibrillation: Secondary | ICD-10-CM

## 2017-03-24 NOTE — Progress Notes (Signed)
  Echocardiogram 2D Echocardiogram has been performed.  Dalton Molina, Dalton Molina M 03/24/2017, 8:17 AM

## 2017-04-10 ENCOUNTER — Inpatient Hospital Stay (HOSPITAL_COMMUNITY): Admission: RE | Admit: 2017-04-10 | Payer: Self-pay | Source: Ambulatory Visit | Admitting: Nurse Practitioner

## 2017-04-17 ENCOUNTER — Ambulatory Visit (HOSPITAL_COMMUNITY): Payer: Self-pay | Admitting: Nurse Practitioner

## 2017-06-05 ENCOUNTER — Emergency Department (HOSPITAL_COMMUNITY)
Admission: EM | Admit: 2017-06-05 | Discharge: 2017-06-05 | Disposition: A | Discharge status: Home / Self Care | Payer: Self-pay | Attending: Emergency Medicine | Admitting: Emergency Medicine

## 2017-06-05 ENCOUNTER — Encounter (HOSPITAL_COMMUNITY): Payer: Self-pay | Admitting: Emergency Medicine

## 2017-06-05 DIAGNOSIS — J449 Chronic obstructive pulmonary disease, unspecified: Secondary | ICD-10-CM | POA: Insufficient documentation

## 2017-06-05 DIAGNOSIS — F1014 Alcohol abuse with alcohol-induced mood disorder: Secondary | ICD-10-CM | POA: Insufficient documentation

## 2017-06-05 DIAGNOSIS — Z79899 Other long term (current) drug therapy: Secondary | ICD-10-CM | POA: Insufficient documentation

## 2017-06-05 DIAGNOSIS — F17210 Nicotine dependence, cigarettes, uncomplicated: Secondary | ICD-10-CM | POA: Insufficient documentation

## 2017-06-05 DIAGNOSIS — R45851 Suicidal ideations: Secondary | ICD-10-CM | POA: Insufficient documentation

## 2017-06-05 HISTORY — DX: Bipolar disorder, unspecified: F31.9

## 2017-06-05 HISTORY — DX: Post-traumatic stress disorder, unspecified: F43.10

## 2017-06-05 LAB — RAPID URINE DRUG SCREEN, HOSP PERFORMED
Amphetamines: NOT DETECTED
Barbiturates: NOT DETECTED
Benzodiazepines: NOT DETECTED
Cocaine: NOT DETECTED
OPIATES: NOT DETECTED
Tetrahydrocannabinol: NOT DETECTED

## 2017-06-05 LAB — COMPREHENSIVE METABOLIC PANEL
ALK PHOS: 55 U/L (ref 38–126)
ALT: 54 U/L (ref 17–63)
ANION GAP: 10 (ref 5–15)
AST: 42 U/L — ABNORMAL HIGH (ref 15–41)
Albumin: 4 g/dL (ref 3.5–5.0)
BUN: 12 mg/dL (ref 6–20)
CALCIUM: 8.9 mg/dL (ref 8.9–10.3)
CO2: 23 mmol/L (ref 22–32)
CREATININE: 1.1 mg/dL (ref 0.61–1.24)
Chloride: 105 mmol/L (ref 101–111)
Glucose, Bld: 111 mg/dL — ABNORMAL HIGH (ref 65–99)
Potassium: 4.2 mmol/L (ref 3.5–5.1)
Sodium: 138 mmol/L (ref 135–145)
Total Bilirubin: 0.7 mg/dL (ref 0.3–1.2)
Total Protein: 6.9 g/dL (ref 6.5–8.1)

## 2017-06-05 LAB — ACETAMINOPHEN LEVEL

## 2017-06-05 LAB — TROPONIN I: Troponin I: <0.03 ng/mL (ref <0.03)

## 2017-06-05 LAB — CBC
HCT: 44.2 % (ref 39.0–52.0)
Hemoglobin: 16.1 g/dL (ref 13.0–17.0)
MCH: 34.1 pg — AB (ref 26.0–34.0)
MCHC: 36.4 g/dL — AB (ref 30.0–36.0)
MCV: 93.6 fL (ref 78.0–100.0)
PLATELETS: 251 10*3/uL (ref 150–400)
RBC: 4.72 MIL/uL (ref 4.22–5.81)
RDW: 13 % (ref 11.5–15.5)
WBC: 9.5 10*3/uL (ref 4.0–10.5)

## 2017-06-05 LAB — ETHANOL: ALCOHOL ETHYL (B): 47 mg/dL — AB (ref <5)

## 2017-06-05 LAB — SALICYLATE LEVEL

## 2017-06-05 MED ORDER — NICOTINE 21 MG/24HR TD PT24
21.0000 mg | MEDICATED_PATCH | Freq: Every day | TRANSDERMAL | Status: DC
  Administered 2017-06-05: 21 mg via TRANSDERMAL
  Filled 2017-06-05: qty 1

## 2017-06-05 NOTE — Discharge Instructions (Signed)
For your ongoing mental health needs, you are advised to follow up with [facility].  New and returning patients are seen at their walk-in clinic.  Walk-in hours are Monday - Friday from 8:00 am - 3:00 pm.  Walk-in patients are seen on a first come, first served basis.  Try to arrive as early as possible for he best chance of being seen the same day: ° °     [facility] °     [address] °     [place], [place] [address] °     [phone] °
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
## 2017-06-05 NOTE — BHH Suicide Risk Assessment (Signed)
Suicide Risk Assessment  Discharge Assessment   [facility]   Principal Problem: Alcohol abuse with alcohol-induced mood disorder [facility](HCC) Discharge Diagnoses:  Patient Active Problem List   Diagnosis Date Noted  . Alcohol abuse with alcohol-induced mood disorder (HCC) [F10.14] 09/03/2016    Priority: High  . MDD (major depressive disorder), recurrent severe, without psychosis (HCC) [F33.2] 09/28/2016  . Substance induced mood disorder (HCC) [F19.94] 09/06/2016  . Alcohol dependence (HCC) [F10.20] 08/04/2013  . Anxiety state, unspecified [F41.1] 08/04/2013  . Suicidal ideation [R45.851] 07/26/2013  . Depressive disorder [F32.9] 07/18/2013  . Alcohol abuse [F10.10] 07/18/2013  . S/P alcohol detoxification [Z09] 07/18/2013    Total Time spent with patient: 45 minutes  Musculoskeletal: Strength & Muscle Tone: within normal limits Gait & Station: normal Patient leans: N/A  Psychiatric Specialty Exam:   Blood pressure 119/69, pulse 87, temperature 98.3 F (36.8 C), temperature source Oral, resp. rate 16, height 5\' 9"  (1.753 m), weight 113.4 kg (250 lb), SpO2 99 %.Body mass index is 36.92 kg/m.  General Appearance: Casual  Eye Contact::  Good  Speech:  Normal Rate409  Volume:  Normal  Mood:  Euthymic  Affect:  Congruent  Thought Process:  Coherent and Descriptions of Associations: Intact  Orientation:  Full (Time, Place, and Person)  Thought Content:  WDL and Logical  Suicidal Thoughts:  No  Homicidal Thoughts:  No  Memory:  Immediate;   Good Recent;   Good Remote;   Good  Judgement:  Fair  Insight:  Fair  Psychomotor Activity:  Normal  Concentration:  Good  Recall:  Good  Fund of Knowledge:Good  Language: Good  Akathisia:  No  Handed:  Right  AIMS (if indicated):     Assets:  Leisure Time Physical Health Resilience  Sleep:     Cognition: WNL  ADL's:  Intact   Mental Status Per Nursing Assessment::   On Admission:   alcohol abuse with  suicidal ideations.  Today, "I'm feeling better and ready to get the hell out of here."  No suicidal/homicidal ideations, hallucinations, or withdrawal symptoms.  Stable for discharge.  Demographic Factors:  Male and Caucasian  Loss Factors: NA  Historical Factors: NA  Risk Reduction Factors:   Sense of responsibility to family and Positive social support  Continued Clinical Symptoms:  None  Cognitive Features That Contribute To Risk:  None    Suicide Risk:  Minimal: No identifiable suicidal ideation.  Patients presenting with no risk factors but with morbid ruminations; may be classified as minimal risk based on the severity of the depressive symptoms    Plan Of Care/Follow-up recommendations:  Activity:  as tolerated Diet:  heart healthy diet  Dalton Anagnos, NP 06/05/2017, 10:57 AM

## 2017-06-05 NOTE — ED Notes (Signed)
TTS assessment in progress via tele psych. 

## 2017-06-05 NOTE — BH Assessment (Signed)
BHH Assessment Progress Note  Per Mojeed Akintayo, MD, this pt does not require psychiatric hospitalization at this time.  Pt is to be [facility] from [facility] with recommendation to follow up with [facility].  This has been included in pt's discharge instructions.  Pt's nurse has been notified.  Dabney Schanz, MA Triage Specialist [phone]     

## 2017-06-05 NOTE — ED Triage Notes (Signed)
Pt was brought in by police voluntarily  Pt states he is feeling suicidal  Pt states he was wandering in traffic  Pt states he has hx of depression and is on medications Pt is calm and cooperative  Pt is homeless
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
## 2017-06-05 NOTE — ED Provider Notes (Signed)
[facility]-EMERGENCY DEPT Provider Note   CSN: [id number] Arrival date & time: 06/05/17  0030     History   Chief Complaint Chief Complaint  Patient presents with  . Suicidal  . Homeless    HPI Dalton ItoGuy Achord is a 51 y.o. male.  Patient presents for evaluation and treatment of suicidal ideation. He reports being depressed about his life situation. He denies self injury prior to arrival. He states he had an episode of chest discomfort earlier today. No pain now. He has a history of atrial fibrillation but no history of ischemic heart disease. No SOB, nausea, vomiting, cough, fever.    The history is provided by the patient. No language interpreter was used.    Past Medical History:  Diagnosis Date  . Alcoholism /alcohol abuse (HCC)   . Atrial fibrillation (HCC)   . Bipolar disorder (HCC)   . COPD (chronic obstructive pulmonary disease) (HCC)   . Depression   . GERD (gastroesophageal reflux disease)   . Osteoarthritis   . PTSD (post-traumatic stress disorder)   . Scoliosis     Patient Active Problem List   Diagnosis Date Noted  . MDD (major depressive disorder), recurrent severe, without psychosis (HCC) 09/28/2016  . Substance induced mood disorder (HCC) 09/06/2016  . Alcohol abuse with alcohol-induced mood disorder (HCC) 09/03/2016  . Alcohol dependence (HCC) 08/04/2013  . Anxiety state, unspecified 08/04/2013  . Suicidal ideation 07/26/2013  . Depressive disorder 07/18/2013  . Alcohol abuse 07/18/2013  . S/P alcohol detoxification 07/18/2013    Past Surgical History:  Procedure Laterality Date  . APPENDECTOMY         Home Medications    Prior to Admission medications   Medication Sig Start Date End Date Taking? Authorizing Provider  albuterol (PROVENTIL HFA;VENTOLIN HFA) 108 (90 Base) MCG/ACT inhaler Inhale 2 puffs into the lungs every 6 (six) hours as needed for wheezing or shortness of breath. 09/05/16  Yes Laveda AbbeParks, Laurie Britton, NP  divalproex (DEPAKOTE)  500 MG DR tablet Take 1 tablet (500 mg total) by mouth 2 (two) times daily. 09/07/16  Yes Thermon Leylandavis, Laura A, NP  gabapentin (NEURONTIN) 300 MG capsule Take 1 capsule (300 mg total) by mouth 3 (three) times daily. 09/07/16  Yes Thermon Leylandavis, Laura A, NP  diclofenac sodium (VOLTAREN) 1 % GEL Apply 2 g topically 4 (four) times daily. Patient not taking: Reported on 06/05/2017 03/13/17   Anselm PancoastJoy, Shawn C, PA-C    Family History Family History  Problem Relation Age of Onset  . Cancer Mother        bladder  . Cancer Father        mesothelioma    Social History Social History  Substance Use Topics  . Smoking status: Current Every Day Smoker    Packs/day: 1.00    Years: 15.00    Types: Cigarettes  . Smokeless tobacco: Never Used     Comment: Refused cessation materials  . Alcohol use 50.4 oz/week    84 Cans of beer per week     Comment: reports is an alcoholic     Allergies   Percocet [oxycodone-acetaminophen]   Review of Systems Review of Systems  Constitutional: Negative for chills, diaphoresis and fever.  Respiratory: Negative.  Negative for shortness of breath.   Cardiovascular: Positive for chest pain (See HPI.).  Gastrointestinal: Negative.  Negative for abdominal pain, nausea and vomiting.  Musculoskeletal: Negative.  Negative for myalgias.  Neurological: Negative.   Psychiatric/Behavioral: Positive for dysphoric mood and suicidal ideas. Negative for  self-injury.     Physical Exam Updated Vital Signs BP 117/75 (BP Location: Right Arm)   Pulse 88   Temp 98.3 F (36.8 C) (Oral)   Resp 16   Ht 5\' 9"  (1.753 m)   Wt 113.4 kg (250 lb)   SpO2 94%   BMI 36.92 kg/m   Physical Exam  Constitutional: He is oriented to person, place, and time. He appears well-developed and well-nourished. No distress.  HENT:  Head: Normocephalic.  Neck: Normal range of motion. Neck supple.  Cardiovascular: Normal rate and regular rhythm.   No murmur heard. Pulmonary/Chest: Effort normal and breath  sounds normal. He has no wheezes. He has no rales. He exhibits no tenderness.  Abdominal: Soft. Bowel sounds are normal. There is no tenderness. There is no rebound and no guarding.  Musculoskeletal: Normal range of motion.  Neurological: He is alert and oriented to person, place, and time.  Skin: Skin is warm and dry. No rash noted.  Psychiatric: He is not actively hallucinating. He exhibits a depressed mood. He expresses suicidal ideation. He expresses suicidal plans.     ED Treatments / Results  Labs (all labs ordered are listed, but only abnormal results are displayed) Labs Reviewed  COMPREHENSIVE METABOLIC PANEL - Abnormal; Notable for the following:       Result Value   Glucose, Bld 111 (*)    AST 42 (*)    All other components within normal limits  ETHANOL - Abnormal; Notable for the following:    Alcohol, Ethyl (B) 47 (*)    All other components within normal limits  ACETAMINOPHEN LEVEL - Abnormal; Notable for the following:    Acetaminophen (Tylenol), Serum <10 (*)    All other components within normal limits  CBC - Abnormal; Notable for the following:    MCH 34.1 (*)    MCHC 36.4 (*)    All other components within normal limits  SALICYLATE LEVEL  RAPID URINE DRUG SCREEN, HOSP PERFORMED  TROPONIN I    EKG  EKG Interpretation None       Radiology No results found.  Procedures Procedures (including critical care time)  Medications Ordered in ED Medications - No data to display   Initial Impression / Assessment and Plan / ED Course  I have reviewed the triage vital signs and the nursing notes.  Pertinent labs & imaging results that were available during my care of the patient were reviewed by me and considered in my medical decision making (see chart for details).     Patient here with SI and depression, history of suicide attempt in the past.   He complains of episode chest pain earlier. No pain now. No history of ischemic disease. EKG unremarkable.  No atrial fibrillation. He can be medically cleared.   TTS consultation requested to determine disposition.   Per TTS, patient meets inpatient criteria  Final Clinical Impressions(s) / ED Diagnoses   Final diagnoses:  None   1. SI 2. Chest pain, nonspecific  New Prescriptions New Prescriptions   No medications on file     Elpidio Anis, Cordelia Poche 06/05/17 0731    Melene Plan, DO 06/06/17 0225

## 2018-10-31 ENCOUNTER — Encounter (HOSPITAL_COMMUNITY): Payer: Self-pay | Admitting: Emergency Medicine

## 2018-10-31 ENCOUNTER — Emergency Department (HOSPITAL_COMMUNITY): Payer: Medicare Other

## 2018-10-31 ENCOUNTER — Emergency Department (HOSPITAL_COMMUNITY)
Admission: EM | Admit: 2018-10-31 | Discharge: 2018-10-31 | Disposition: A | Discharge status: Home / Self Care | Payer: Medicare Other | Attending: Emergency Medicine | Admitting: Emergency Medicine

## 2018-10-31 DIAGNOSIS — R05 Cough: Secondary | ICD-10-CM | POA: Diagnosis present

## 2018-10-31 DIAGNOSIS — J441 Chronic obstructive pulmonary disease with (acute) exacerbation: Secondary | ICD-10-CM | POA: Insufficient documentation

## 2018-10-31 DIAGNOSIS — Z79899 Other long term (current) drug therapy: Secondary | ICD-10-CM | POA: Insufficient documentation

## 2018-10-31 DIAGNOSIS — F17210 Nicotine dependence, cigarettes, uncomplicated: Secondary | ICD-10-CM | POA: Insufficient documentation

## 2018-10-31 MED ORDER — IBUPROFEN 200 MG PO TABS
600.0000 mg | ORAL_TABLET | Freq: Once | ORAL | Status: AC
  Administered 2018-10-31: 600 mg via ORAL
  Filled 2018-10-31: qty 1

## 2018-10-31 MED ORDER — ALBUTEROL SULFATE HFA 108 (90 BASE) MCG/ACT IN AERS: 2.0000 | INHALATION_SPRAY | RESPIRATORY_TRACT | 0 refills | Status: DC | PRN

## 2018-10-31 MED ORDER — PREDNISONE 20 MG PO TABS
60.0000 mg | ORAL_TABLET | Freq: Once | ORAL | Status: AC
  Administered 2018-10-31: 60 mg via ORAL
  Filled 2018-10-31: qty 3

## 2018-10-31 MED ORDER — PREDNISONE 20 MG PO TABS: 60.0000 mg | ORAL_TABLET | Freq: Every day | ORAL | 0 refills | Status: AC

## 2018-10-31 MED ORDER — IBUPROFEN 600 MG PO TABS: 600.0000 mg | ORAL_TABLET | Freq: Four times a day (QID) | ORAL | 0 refills | Status: DC | PRN

## 2018-10-31 MED ORDER — IPRATROPIUM-ALBUTEROL 0.5-2.5 (3) MG/3ML IN SOLN
3.0000 mL | Freq: Once | RESPIRATORY_TRACT | Status: AC
  Administered 2018-10-31: 3 mL via RESPIRATORY_TRACT
  Filled 2018-10-31: qty 3

## 2018-10-31 MED ORDER — IPRATROPIUM BROMIDE HFA 17 MCG/ACT IN AERS
2.0000 | INHALATION_SPRAY | RESPIRATORY_TRACT | 0 refills | Status: DC | PRN
Start: 2018-10-31 — End: 2024-04-01

## 2018-10-31 MED ORDER — AZITHROMYCIN 250 MG PO TABS: 250.0000 mg | ORAL_TABLET | Freq: Every day | ORAL | 0 refills | Status: DC

## 2018-10-31 NOTE — ED Triage Notes (Signed)
Pt arrives from home with c/o of productive cough for 1 week. Pt has COPD-still currently smoking. Pt states this am was coughing and felt like something "popped' in the left side of lower ribs.

## 2018-10-31 NOTE — ED Provider Notes (Signed)
[facility] Provider Note   CSN: [id number] Arrival date & time: 10/31/18  1032     History   Chief Complaint Chief Complaint  Patient presents with  . Cough    HPI Dalton ItoGuy Molina is a 52 y.o. male.  Dalton Molina is a 52 y.o. male with a history of COPD, atrial fibrillation, bipolar disorder, PTSD, GERD, alcohol abuse, who presents to the emergency department for evaluation of productive cough for about a week and a half.  He reports that his sputum production has increased it was initially yellow is now clearing up but he continues to cough more and more.  This morning after heavy coughing spell he felt like something popped over the left lower side of his ribs and since then he has had increasing pain there with coughing.  No overlying bruising or skin changes.  No persistent pain over the chest.  He denies feeling short of breath but has had some wheezing.  Reports he ran out of his COPD inhalers.  Patient reports he is still smoking and has not followed up with his doctor regularly for management of his COPD.  Has not seen anyone or been on any antibiotics for cough.  He denies fevers.  No associated rhinorrhea, nasal congestion or sore throat.  No abdominal pain.  He has not taken anything to treat his symptoms prior to arrival, no other aggravating or alleviating factors.     Past Medical History:  Diagnosis Date  . Alcoholism /alcohol abuse (HCC)   . Atrial fibrillation (HCC)   . Bipolar disorder (HCC)   . COPD (chronic obstructive pulmonary disease) (HCC)   . Depression   . GERD (gastroesophageal reflux disease)   . Osteoarthritis   . PTSD (post-traumatic stress disorder)   . Scoliosis     Patient Active Problem List   Diagnosis Date Noted  . MDD (major depressive disorder), recurrent severe, without psychosis (HCC) 09/28/2016  . Substance induced mood disorder (HCC) 09/06/2016  . Alcohol abuse with alcohol-induced mood disorder  (HCC) 09/03/2016  . Alcohol dependence (HCC) 08/04/2013  . Anxiety state, unspecified 08/04/2013  . Suicidal ideation 07/26/2013  . Depressive disorder 07/18/2013  . Alcohol abuse 07/18/2013  . S/P alcohol detoxification 07/18/2013    Past Surgical History:  Procedure Laterality Date  . APPENDECTOMY          Home Medications    Prior to Admission medications   Medication Sig Start Date End Date Taking? Authorizing Provider  albuterol (PROVENTIL HFA;VENTOLIN HFA) 108 (90 Base) MCG/ACT inhaler Inhale 2 puffs into the lungs every 4 (four) hours as needed for wheezing or shortness of breath. 10/31/18   Dartha LodgeFord, Leopold Smyers N, PA-C  azithromycin (ZITHROMAX) 250 MG tablet Take 1 tablet (250 mg total) by mouth daily. Take first 2 tablets together, then 1 every day until finished. 10/31/18   Dartha LodgeFord, Lizmarie Witters N, PA-C  diclofenac sodium (VOLTAREN) 1 % GEL Apply 2 g topically 4 (four) times daily. Patient not taking: Reported on 06/05/2017 03/13/17   Joy, Ines BloomerShawn C, PA-C  divalproex (DEPAKOTE) 500 MG DR tablet Take 1 tablet (500 mg total) by mouth 2 (two) times daily. 09/07/16   Thermon Leylandavis, Laura A, NP  gabapentin (NEURONTIN) 300 MG capsule Take 1 capsule (300 mg total) by mouth 3 (three) times daily. 09/07/16   Thermon Leylandavis, Laura A, NP  ibuprofen (ADVIL,MOTRIN) 600 MG tablet Take 1 tablet (600 mg total) by mouth every 6 (six) hours as needed. 10/31/18   Ala DachFord,  Arva Chafe, PA-C  ipratropium (ATROVENT HFA) 17 MCG/ACT inhaler Inhale 2 puffs into the lungs every 4 (four) hours as needed for wheezing. 10/31/18   Dartha Lodge, PA-C  predniSONE (DELTASONE) 20 MG tablet Take 3 tablets (60 mg total) by mouth daily for 5 days. 10/31/18 11/05/18  Dartha Lodge, PA-C    Family History Family History  Problem Relation Age of Onset  . Cancer Mother        bladder  . Cancer Father        mesothelioma    Social History Social History   Tobacco Use  . Smoking status: Current Every Day Smoker    Packs/day: 1.00    Years:  15.00    Pack years: 15.00    Types: Cigarettes  . Smokeless tobacco: Never Used  . Tobacco comment: Refused cessation materials  Substance Use Topics  . Alcohol use: Yes    Alcohol/week: 84.0 standard drinks    Types: 84 Cans of beer per week    Comment: reports is an alcoholic  . Drug use: No    Comment: Denies use     Allergies   Percocet [oxycodone-acetaminophen]   Review of Systems Review of Systems  Constitutional: Negative for chills and fever.  HENT: Negative.   Respiratory: Positive for cough, shortness of breath and wheezing. Negative for chest tightness.   Cardiovascular: Negative for chest pain.  Gastrointestinal: Negative for abdominal pain, nausea and vomiting.  Musculoskeletal: Negative for arthralgias and myalgias.  Skin: Negative for color change and rash.  Neurological: Negative for dizziness, syncope and light-headedness.  All other systems reviewed and are negative.    Physical Exam Updated Vital Signs BP 124/89 (BP Location: Right Arm)   Pulse 87   Temp 98.5 F (36.9 C) (Oral)   Resp 16   SpO2 97%   Physical Exam Vitals signs and nursing note reviewed.  Constitutional:      General: He is not in acute distress.    Appearance: Normal appearance. He is well-developed. He is obese. He is not ill-appearing or diaphoretic.  HENT:     Head: Normocephalic and atraumatic.     Mouth/Throat:     Mouth: Mucous membranes are moist.     Pharynx: Oropharynx is clear.  Eyes:     General:        Right eye: No discharge.        Left eye: No discharge.  Neck:     Musculoskeletal: Neck supple.     Comments: No stridor Cardiovascular:     Rate and Rhythm: Normal rate and regular rhythm.     Pulses: Normal pulses.     Heart sounds: Normal heart sounds. No murmur. No friction rub. No gallop.   Pulmonary:     Effort: Pulmonary effort is normal. No respiratory distress.     Breath sounds: Wheezing present.     Comments: Respirations equal and unlabored,  patient able to speak in full sentences, lungs with scattered end expiratory wheezes throughout but good air movement, intermittent cough throughout exam Tenderness over the left lateral lower ribs without overlying skin changes or palpable deformity. Chest:     Chest wall: Tenderness present.  Abdominal:     General: Abdomen is flat. Bowel sounds are normal. There is no distension.     Palpations: Abdomen is soft. There is no mass.     Tenderness: There is no abdominal tenderness. There is no guarding.  Musculoskeletal:  General: No deformity.  Skin:    General: Skin is warm and dry.  Neurological:     Mental Status: He is alert and oriented to person, place, and time.     Coordination: Coordination normal.  Psychiatric:        Mood and Affect: Mood normal.        Behavior: Behavior normal.      ED Treatments / Results  Labs (all labs ordered are listed, but only abnormal results are displayed) Labs Reviewed - No data to display  EKG None  Radiology Dg Chest 1 View  Result Date: 10/31/2018 CLINICAL DATA:  Productive cough. EXAM: CHEST  1 VIEW COMPARISON:  None. FINDINGS: On the single lateral view, no focal infiltrate or abnormality noted. IMPRESSION: On this single lateral view, no focal infiltrate or acute abnormality noted. Electronically Signed   By: Gerome Sam III M.D   On: 10/31/2018 11:32   Dg Ribs Unilateral W/chest Left  Result Date: 10/31/2018 CLINICAL DATA:  Productive cough with green phlegm for 1.5 weeks. Patient felt a pop in left lower rib and is now having pain. EXAM: LEFT RIBS AND CHEST - 3+ VIEW COMPARISON:  February 06, 2017 FINDINGS: Haziness near the left cardiac apex is more prominent the interval. No correlate on the lateral view of the same day. No pneumothorax. The cardiomediastinal silhouette is stable. The right lung is clear. No rib fractures are noted. IMPRESSION: 1. No rib fractures identified. 2. Haziness near the left cardiac apex is  favored represent atelectasis or overlapping soft tissues. Infiltrate considered less likely but not completely excluded. There is no correlate on the lateral view. Recommend short-term follow-up to ensure resolution. Electronically Signed   By: Gerome Sam III M.D   On: 10/31/2018 11:32    Procedures Procedures (including critical care time)  Medications Ordered in ED Medications  ipratropium-albuterol (DUONEB) 0.5-2.5 (3) MG/3ML nebulizer solution 3 mL (3 mLs Nebulization Given 10/31/18 1129)  predniSONE (DELTASONE) tablet 60 mg (60 mg Oral Given 10/31/18 1055)  ibuprofen (ADVIL,MOTRIN) tablet 600 mg (600 mg Oral Given 10/31/18 1055)  ipratropium-albuterol (DUONEB) 0.5-2.5 (3) MG/3ML nebulizer solution 3 mL (3 mLs Nebulization Given 10/31/18 1243)     Initial Impression / Assessment and Plan / ED Course  I have reviewed the triage vital signs and the nursing notes.  Pertinent labs & imaging results that were available during my care of the patient were reviewed by me and considered in my medical decision making (see chart for details).  Patient presents with a week and a half of persistent cough with increasing sputum production, when coughing this morning he felt like something over the left lower ribs popped and since then he has had pain there primarily with coughing, no chest pain elsewhere he reports increasing wheezing, does have history of COPD and has been out of and inhalers and is still smoking.  He has not had any fevers.  Presentation seems most consistent with mild COPD exacerbation.  Will check chest x-ray with dedicated rib films over the left to ensure there is no rib fracture from repetitive coughing.  Suspect muscle strain in this region.  We will also assess for any signs of pneumonia.  Patient does have some wheezing throughout but is moving good air with no hypoxia or tachypnea.  Will give breathing treatments and steroids here in the emergency department.  X-ray and  rib films show no evidence of rib fracture, there is a haziness near the left cardiac apex  that is likely atelectasis or overlapping soft tissues as there is no correlating consolidation on the lateral view to suggest pneumonia, but given patient's COPD with increased sputum production will start patient on azithromycin.  After steroids and 2 breathing treatments his breathing has significantly improved lungs with only faint expiratory wheezes.  He was ambulated in the department maintained O2 saturations greater than 96%.  Suspect mild COPD exacerbation but patient is clinically improved and stable for discharge home at this time.  Will discharge with azithromycin, steroid burst and inhalers and have patient follow-up with his primary care doctor.  He was also counseled on smoking cessation.  Final Clinical Impressions(s) / ED Diagnoses   Final diagnoses:  COPD with acute exacerbation [facility])    ED Discharge Orders         Ordered    albuterol (PROVENTIL HFA;VENTOLIN HFA) 108 (90 Base) MCG/ACT inhaler  Every 4 hours PRN     10/31/18 1254    ipratropium (ATROVENT HFA) 17 MCG/ACT inhaler  Every 4 hours PRN     10/31/18 1254    azithromycin (ZITHROMAX) 250 MG tablet  Daily     10/31/18 1254    predniSONE (DELTASONE) 20 MG tablet  Daily     10/31/18 1254    ibuprofen (ADVIL,MOTRIN) 600 MG tablet  Every 6 hours PRN     10/31/18 1254           Dartha Lodge, PA-C 11/01/18 1610    Wynetta Fines, MD 11/01/18 [phone]

## 2018-10-31 NOTE — Discharge Instructions (Addendum)
Please take antibiotics and steroids as directed you may use both inhalers every 4-6 hours as needed for shortness of breath and wheezing.  If your cough is still not improving please follow-up with your regular doctor.  Your chest x-ray did not show any pneumonia or evidence of rib fracture.  You may use ibuprofen and Tylenol as needed for pain.  Return for fevers, worsening cough, chest pain or shortness of breath.

## 2018-10-31 NOTE — ED Notes (Signed)
Pt remained 95-96% while ambulating down hall

## 2018-10-31 NOTE — ED Notes (Signed)
Pt stable, ambulatory, states understanding of discharge instructions 

## 2020-07-23 ENCOUNTER — Encounter (HOSPITAL_COMMUNITY): Payer: Self-pay | Admitting: Emergency Medicine

## 2020-07-23 ENCOUNTER — Emergency Department (HOSPITAL_COMMUNITY)
Admission: EM | Admit: 2020-07-23 | Discharge: 2020-07-24 | Disposition: A | Discharge status: Home / Self Care | Payer: Medicare PPO | Attending: Emergency Medicine | Admitting: Emergency Medicine

## 2020-07-23 ENCOUNTER — Emergency Department (HOSPITAL_COMMUNITY): Payer: Medicare PPO

## 2020-07-23 ENCOUNTER — Other Ambulatory Visit: Payer: Self-pay

## 2020-07-23 DIAGNOSIS — Z5321 Procedure and treatment not carried out due to patient leaving prior to being seen by health care provider: Secondary | ICD-10-CM | POA: Diagnosis not present

## 2020-07-23 DIAGNOSIS — R0602 Shortness of breath: Secondary | ICD-10-CM | POA: Insufficient documentation

## 2020-07-23 NOTE — ED Triage Notes (Signed)
Pt BIB [facility] from his residence, pt reports he woke up gasping for air. Hx sleep apnea and has been unable to get a cpap machine.

## 2020-08-02 DIAGNOSIS — F250 Schizoaffective disorder, bipolar type: Secondary | ICD-10-CM | POA: Diagnosis not present

## 2020-10-19 DIAGNOSIS — F250 Schizoaffective disorder, bipolar type: Secondary | ICD-10-CM | POA: Diagnosis not present

## 2023-02-22 ENCOUNTER — Encounter (HOSPITAL_BASED_OUTPATIENT_CLINIC_OR_DEPARTMENT_OTHER): Payer: Self-pay

## 2023-02-22 DIAGNOSIS — G4733 Obstructive sleep apnea (adult) (pediatric): Secondary | ICD-10-CM

## 2023-03-04 ENCOUNTER — Emergency Department (HOSPITAL_COMMUNITY)
Admission: EM | Admit: 2023-03-04 | Discharge: 2023-03-05 | Disposition: A | Discharge status: Home / Self Care | Payer: Medicare HMO | Attending: Emergency Medicine | Admitting: Emergency Medicine

## 2023-03-04 ENCOUNTER — Emergency Department (HOSPITAL_COMMUNITY): Payer: Medicare HMO

## 2023-03-04 ENCOUNTER — Other Ambulatory Visit: Payer: Self-pay

## 2023-03-04 DIAGNOSIS — J069 Acute upper respiratory infection, unspecified: Secondary | ICD-10-CM | POA: Diagnosis not present

## 2023-03-04 DIAGNOSIS — Z20822 Contact with and (suspected) exposure to covid-19: Secondary | ICD-10-CM | POA: Insufficient documentation

## 2023-03-04 DIAGNOSIS — F1020 Alcohol dependence, uncomplicated: Secondary | ICD-10-CM | POA: Diagnosis present

## 2023-03-04 DIAGNOSIS — F332 Major depressive disorder, recurrent severe without psychotic features: Secondary | ICD-10-CM | POA: Diagnosis not present

## 2023-03-04 DIAGNOSIS — F32A Depression, unspecified: Secondary | ICD-10-CM | POA: Diagnosis present

## 2023-03-04 DIAGNOSIS — R45851 Suicidal ideations: Secondary | ICD-10-CM

## 2023-03-04 DIAGNOSIS — J449 Chronic obstructive pulmonary disease, unspecified: Secondary | ICD-10-CM | POA: Insufficient documentation

## 2023-03-04 LAB — CBC WITH DIFFERENTIAL/PLATELET
Abs Immature Granulocytes: 0.03 10*3/uL (ref 0.00–0.07)
Basophils Absolute: 0 10*3/uL (ref 0.0–0.1)
Basophils Relative: 0 %
Eosinophils Absolute: 0.5 10*3/uL (ref 0.0–0.5)
Eosinophils Relative: 5 %
HCT: 52.1 % — ABNORMAL HIGH (ref 39.0–52.0)
Hemoglobin: 18.1 g/dL — ABNORMAL HIGH (ref 13.0–17.0)
Immature Granulocytes: 0 %
Lymphocytes Relative: 39 %
Lymphs Abs: 4 10*3/uL (ref 0.7–4.0)
MCH: 32.7 pg (ref 26.0–34.0)
MCHC: 34.7 g/dL (ref 30.0–36.0)
MCV: 94 fL (ref 80.0–100.0)
Monocytes Absolute: 0.8 10*3/uL (ref 0.1–1.0)
Monocytes Relative: 8 %
Neutro Abs: 4.9 10*3/uL (ref 1.7–7.7)
Neutrophils Relative %: 48 %
Platelets: 257 10*3/uL (ref 150–400)
RBC: 5.54 MIL/uL (ref 4.22–5.81)
RDW: 12.5 % (ref 11.5–15.5)
WBC: 10.2 10*3/uL (ref 4.0–10.5)
nRBC: 0 % (ref 0.0–0.2)

## 2023-03-04 LAB — SALICYLATE LEVEL: Salicylate Lvl: <7 mg/dL — ABNORMAL LOW (ref 7.0–30.0)

## 2023-03-04 LAB — COMPREHENSIVE METABOLIC PANEL
ALT: 41 U/L (ref 0–44)
AST: 42 U/L — ABNORMAL HIGH (ref 15–41)
Albumin: 4.3 g/dL (ref 3.5–5.0)
Alkaline Phosphatase: 61 U/L (ref 38–126)
Anion gap: 10 (ref 5–15)
BUN: 9 mg/dL (ref 6–20)
CO2: 23 mmol/L (ref 22–32)
Calcium: 8.9 mg/dL (ref 8.9–10.3)
Chloride: 101 mmol/L (ref 98–111)
Creatinine, Ser: 0.92 mg/dL (ref 0.61–1.24)
GFR, Estimated: >60 mL/min (ref >60)
Glucose, Bld: 95 mg/dL (ref 70–99)
Potassium: 4.3 mmol/L (ref 3.5–5.1)
Sodium: 134 mmol/L — ABNORMAL LOW (ref 135–145)
Total Bilirubin: 0.6 mg/dL (ref 0.3–1.2)
Total Protein: 7.9 g/dL (ref 6.5–8.1)

## 2023-03-04 LAB — ETHANOL: Alcohol, Ethyl (B): 117 mg/dL — ABNORMAL HIGH (ref <10)

## 2023-03-04 LAB — ACETAMINOPHEN LEVEL: Acetaminophen (Tylenol), Serum: <10 ug/mL — ABNORMAL LOW (ref 10–30)

## 2023-03-04 LAB — SARS CORONAVIRUS 2 BY RT PCR: SARS Coronavirus 2 by RT PCR: NEGATIVE

## 2023-03-04 MED ORDER — IPRATROPIUM BROMIDE HFA 17 MCG/ACT IN AERS: 2.0000 | INHALATION_SPRAY | RESPIRATORY_TRACT | Status: DC | PRN

## 2023-03-04 MED ORDER — ALBUTEROL SULFATE HFA 108 (90 BASE) MCG/ACT IN AERS: 2.0000 | INHALATION_SPRAY | RESPIRATORY_TRACT | Status: DC | PRN

## 2023-03-04 MED ORDER — FLUOXETINE HCL 10 MG PO CAPS: 10.0000 mg | ORAL_CAPSULE | Freq: Every day | ORAL | Status: DC

## 2023-03-04 MED ORDER — DEXAMETHASONE 4 MG PO TABS
10.0000 mg | ORAL_TABLET | Freq: Once | ORAL | Status: AC
  Administered 2023-03-04: 10 mg via ORAL
  Filled 2023-03-04: qty 1

## 2023-03-04 MED ORDER — LORAZEPAM 1 MG PO TABS: 1.0000 mg | ORAL_TABLET | Freq: Three times a day (TID) | ORAL | Status: DC | PRN

## 2023-03-04 MED ORDER — IPRATROPIUM-ALBUTEROL 0.5-2.5 (3) MG/3ML IN SOLN
3.0000 mL | RESPIRATORY_TRACT | Status: AC
  Administered 2023-03-04 (×3): 3 mL via RESPIRATORY_TRACT
  Filled 2023-03-04: qty 6
  Filled 2023-03-04: qty 3

## 2023-03-04 MED ORDER — DIVALPROEX SODIUM 500 MG PO DR TAB: 500.0000 mg | DELAYED_RELEASE_TABLET | Freq: Two times a day (BID) | ORAL | Status: DC

## 2023-03-04 MED ORDER — GABAPENTIN 300 MG PO CAPS
300.0000 mg | ORAL_CAPSULE | Freq: Three times a day (TID) | ORAL | Status: DC
  Administered 2023-03-04 (×2): 300 mg via ORAL
  Filled 2023-03-04 (×2): qty 1

## 2023-03-04 NOTE — ED Triage Notes (Signed)
Patient arrived from [facility] on [address] in [place]. [place] police behavioral unit brought him in because he called stating that he is depressed and feels like overdosing on pills. He also states that he has symptoms that resemble flu or pneumonia. He drinks a lot and many beer bottles were found in his room. He has COPD and heart disease.

## 2023-03-04 NOTE — Consult Note (Signed)
BH ED ASSESSMENT   Reason for Consult:  Psychiatry evaluation Referring Physician:  ER Physician Patient Identification: Dalton Molina MRN:  [id number] ED Chief Complaint: MDD (major depressive disorder), recurrent severe, without psychosis  Diagnosis:  Principal Problem:   MDD (major depressive disorder), recurrent severe, without psychosis Active Problems:   Suicidal ideation   Alcohol dependence   ED Assessment Time Calculation: Start Time: 1341 Stop Time: 1409 Total Time in Minutes (Assessment Completion): 28   Subjective:   Dalton Molina is a 57 y.o. male patient admitted with previous hx of Alcoholism, Depression and anxiety brought in by [facility] from  [facility] [place] he [facility] .  Per Triage note patient called [facility] and stated that  he was feeling depressed with plan to OD on his pills.  HPI:  Patient was seen this after for evaluation.  He admitted feeling depressed with suicide ideations with plans to OD on pills.  He also reports that his depressed mood makes him drink too much alcohol.  Patient reports drinking 15 beers three to four times a week.  Patient reports that most of his life he has struggled with Alcoholism and depression.  He currently does not have a Psychiatrist but his PCP was prescribing his medications.  He only remembers taking Hydroxyzine for anxiety.  Patient denies Alcohol withdrawal seizures and has been in [employer] and have also tried long term alcohol rehab treatment at [facility].   Patient is unemployed but receives Disability checks.  He reports no living family and no children.  Patient reports feeling hopeless and helpless.    He lives in extended stay hotel monthly.  Patient reports one time suicide attempt by hanging but fortunately the rope broke and he fell.   Patient states he uses Alcohol to escape from his depression and worries.  Alcohol level on arrival was 117.   He remains suicidal during my assessment with plan to OD on pills.   He reports poor sleep but over eating.  Caucasian male, 57 years old appears older than stated age, obese, disheveled came in for severe depression and alcoholism with plans to OD on pills.  Have not ben taking any Psychotropic medications for two plus years.  Was seeing a Therapist, sports at [facility] but stopped.  Patient meets criteria for inpatient Psychiatry hospitalization.  We will seek bed placement in a mental health unit while we place patient on Ativan as needed for withdrawal symptoms since alcohol level was 117 on arrival. For depression and suicide ideation will start low dose Prozac while waiting for bed. Past Psychiatric History: Depression, Alcoholism,  Previous inpatient Psychiatry hospitalization at [facility] and Observation unit in 2017.  Past Social hx-Sex Offender.  Risk to Self or Others: Is the patient at risk to self? Yes Has the patient been a risk to self in the past 6 months? Yes Has the patient been a risk to self within the distant past? Yes Is the patient a risk to others? No Has the patient been a risk to others in the past 6 months? No Has the patient been a risk to others within the distant past? No  [place] Scale:  Flowsheet Row ED from 03/04/2023 in [facility] Emergency Department at [facility]  C-SSRS RISK CATEGORY High Risk       AIMS:  , , ,  ,   ASAM:    Substance Abuse:     Past Medical History:  Past Medical History:  Diagnosis Date  Alcoholism /alcohol abuse (HCC)    Atrial fibrillation (HCC)    Bipolar disorder (HCC)    COPD (chronic obstructive pulmonary disease) (HCC)    Depression    GERD (gastroesophageal reflux disease)    Osteoarthritis    PTSD (post-traumatic stress disorder)    Scoliosis     Past Surgical History:  Procedure Laterality Date   APPENDECTOMY     Family History:  Family History  Problem Relation Age of Onset   Cancer Mother        bladder   Cancer Father        mesothelioma   Family Psychiatric   History: unknown, states he does not have family members anymore.  Reports father was an alcoholic and mother had unknown Mental health diagnosis. Social History:  Social History   Substance and Sexual Activity  Alcohol Use Yes   Alcohol/week: 84.0 standard drinks of alcohol   Types: 84 Cans of beer per week   Comment: reports is an alcoholic     Social History   Substance and Sexual Activity  Drug Use No   Comment: Denies use    Social History   Socioeconomic History   Marital status: Single    Spouse name: Not on file   Number of children: Not on file   Years of education: Not on file   Highest education level: Not on file  Occupational History   Not on file  Tobacco Use   Smoking status: Every Day    Packs/day: 1.00    Years: 15.00    Additional pack years: 0.00    Total pack years: 15.00    Types: Cigarettes   Smokeless tobacco: Never   Tobacco comments:    Refused cessation materials  Substance and Sexual Activity   Alcohol use: Yes    Alcohol/week: 84.0 standard drinks of alcohol    Types: 84 Cans of beer per week    Comment: reports is an alcoholic   Drug use: No    Comment: Denies use   Sexual activity: Yes  Other Topics Concern   Not on file  Social History Narrative   Not on file   Social Determinants of Health   Financial Resource Strain: Not on file  Food Insecurity: Not on file  Transportation Needs: Not on file  Physical Activity: Not on file  Stress: Not on file  Social Connections: Not on file   Additional Social History:    Allergies:   Allergies  Allergen Reactions   Percocet [Oxycodone-Acetaminophen] Anaphylaxis and Rash    Labs:  Results for orders placed or performed during the hospital encounter of 03/04/23 (from the past 48 hour(s))  SARS Coronavirus 2 by RT PCR (hospital order, performed in [facility] hospital lab) *cepheid single result test* Anterior Nasal Swab     Status: None   Collection Time: 03/04/23  9:25 AM    Specimen: Anterior Nasal Swab  Result Value Ref Range   SARS Coronavirus 2 by RT PCR NEGATIVE NEGATIVE    Comment: (NOTE) SARS-CoV-2 target nucleic acids are NOT DETECTED.  The SARS-CoV-2 RNA is generally detectable in upper and lower respiratory specimens during the acute phase of infection. The lowest concentration of SARS-CoV-2 viral copies this assay can detect is 250 copies / mL. A negative result does not preclude SARS-CoV-2 infection and should not be used as the sole basis for treatment or other patient management decisions.  A negative result may occur with improper specimen collection / handling, submission  of specimen other than nasopharyngeal swab, presence of viral mutation(s) within the areas targeted by this assay, and inadequate number of viral copies (<250 copies / mL). A negative result must be combined with clinical observations, patient history, and epidemiological information.  Fact Sheet for Patients:   RoadLapTop.co.za  Fact Sheet for Healthcare Providers: http://kim-miller.com/  This test is not yet approved or  cleared by the [place] FDA and has been authorized for detection and/or diagnosis of SARS-CoV-2 by FDA under an Emergency Use Authorization (EUA).  This EUA will remain in effect (meaning this test can be used) for the duration of the COVID-19 declaration under Section 564(b)(1) of the Act, 21 U.S.C. section 360bbb-3(b)(1), unless the authorization is terminated or revoked sooner.  Performed at [facility], [address], [place], [place] [address]   CBC with Differential     Status: Abnormal   Collection Time: 03/04/23  9:38 AM  Result Value Ref Range   WBC 10.2 4.0 - 10.5 K/uL   RBC 5.54 4.22 - 5.81 MIL/uL   Hemoglobin 18.1 (H) 13.0 - 17.0 g/dL   HCT 60.4 (H) 54.0 - 98.1 %   MCV 94.0 80.0 - 100.0 fL   MCH 32.7 26.0 - 34.0 pg   MCHC 34.7 30.0 - 36.0 g/dL   RDW 19.1 47.8  - 29.5 %   Platelets 257 150 - 400 K/uL   nRBC 0.0 0.0 - 0.2 %   Neutrophils Relative % 48 %   Neutro Abs 4.9 1.7 - 7.7 K/uL   Lymphocytes Relative 39 %   Lymphs Abs 4.0 0.7 - 4.0 K/uL   Monocytes Relative 8 %   Monocytes Absolute 0.8 0.1 - 1.0 K/uL   Eosinophils Relative 5 %   Eosinophils Absolute 0.5 0.0 - 0.5 K/uL   Basophils Relative 0 %   Basophils Absolute 0.0 0.0 - 0.1 K/uL   Immature Granulocytes 0 %   Abs Immature Granulocytes 0.03 0.00 - 0.07 K/uL    Comment: Performed at [facility], [address] [place], [place] [address]  Comprehensive metabolic panel     Status: Abnormal   Collection Time: 03/04/23  9:38 AM  Result Value Ref Range   Sodium 134 (L) 135 - 145 mmol/L   Potassium 4.3 3.5 - 5.1 mmol/L   Chloride 101 98 - 111 mmol/L   CO2 23 22 - 32 mmol/L   Glucose, Bld 95 70 - 99 mg/dL    Comment: Glucose reference range applies only to samples taken after fasting for at least 8 hours.   BUN 9 6 - 20 mg/dL   Creatinine, Ser 8.65 0.61 - 1.24 mg/dL   Calcium 8.9 8.9 - 78.4 mg/dL   Total Protein 7.9 6.5 - 8.1 g/dL   Albumin 4.3 3.5 - 5.0 g/dL   AST 42 (H) 15 - 41 U/L   ALT 41 0 - 44 U/L   Alkaline Phosphatase 61 38 - 126 U/L   Total Bilirubin 0.6 0.3 - 1.2 mg/dL   GFR, Estimated >69 >62 mL/min    Comment: (NOTE) Calculated using the CKD-EPI Creatinine Equation (2021)    Anion gap 10 5 - 15    Comment: Performed at [facility], [address] [place], [place] [address]  Ethanol     Status: Abnormal   Collection Time: 03/04/23  9:38 AM  Result Value Ref Range   Alcohol, Ethyl (B) 117 (H) <10 mg/dL    Comment: (NOTE) Lowest detectable  limit for serum alcohol is 10 mg/dL.  For medical purposes only. Performed at [facility], [address], [place], [place] [address]   Acetaminophen level     Status: Abnormal   Collection Time: 03/04/23  9:38 AM  Result Value Ref Range   Acetaminophen (Tylenol),  Serum <10 (L) 10 - 30 ug/mL    Comment: (NOTE) Therapeutic concentrations vary significantly. A range of 10-30 ug/mL  may be an effective concentration for many patients. However, some  are best treated at concentrations outside of this range. Acetaminophen concentrations >150 ug/mL at 4 hours after ingestion  and >50 ug/mL at 12 hours after ingestion are often associated with  toxic reactions.  Performed at [facility], [address], [place], [place] [address]   Salicylate level     Status: Abnormal   Collection Time: 03/04/23  9:38 AM  Result Value Ref Range   Salicylate Lvl <7.0 (L) 7.0 - 30.0 mg/dL    Comment: Performed at [facility], [address], [place], [place] [address]    Current Facility-Administered Medications  Medication Dose Route Frequency Provider Last Rate Last Admin   albuterol (VENTOLIN HFA) 108 (90 Base) MCG/ACT inhaler 2 puff  2 puff Inhalation Q4H PRN Melene Plan, DO       divalproex (DEPAKOTE) DR tablet 500 mg  500 mg Oral BID Melene Plan, DO       FLUoxetine (PROZAC) capsule 10 mg  10 mg Oral Daily Dahlia Byes C, NP       gabapentin (NEURONTIN) capsule 300 mg  300 mg Oral TID Melene Plan, DO   300 mg at 03/04/23 1136   ipratropium (ATROVENT HFA) inhaler 2 puff  2 puff Inhalation Q4H PRN Melene Plan, DO       LORazepam (ATIVAN) tablet 1 mg  1 mg Oral Q8H PRN Earney Navy, NP       Current Outpatient Medications  Medication Sig Dispense Refill   albuterol (PROVENTIL HFA;VENTOLIN HFA) 108 (90 Base) MCG/ACT inhaler Inhale 2 puffs into the lungs every 4 (four) hours as needed for wheezing or shortness of breath. 1 Inhaler 0   azithromycin (ZITHROMAX) 250 MG tablet Take 1 tablet (250 mg total) by mouth daily. Take first 2 tablets together, then 1 every day until finished. 6 tablet 0   divalproex (DEPAKOTE) 500 MG DR tablet Take 1 tablet (500 mg total) by mouth 2 (two) times daily. 28 tablet 0   gabapentin  (NEURONTIN) 300 MG capsule Take 1 capsule (300 mg total) by mouth 3 (three) times daily. 42 capsule 0   ibuprofen (ADVIL,MOTRIN) 600 MG tablet Take 1 tablet (600 mg total) by mouth every 6 (six) hours as needed. 30 tablet 0   ipratropium (ATROVENT HFA) 17 MCG/ACT inhaler Inhale 2 puffs into the lungs every 4 (four) hours as needed for wheezing. 1 Inhaler 0    Musculoskeletal: Strength & Muscle Tone: within normal limits Gait & Station: normal Patient leans: Front   Psychiatric Specialty Exam: Presentation  General Appearance:  Casual; Disheveled (obese)  Eye Contact: Good  Speech: Clear and Coherent; Normal Rate  Speech Volume: Normal  Handedness:No data recorded  Mood and Affect  Mood: Depressed; Hopeless; Worthless  Affect: Solicitor Processes: Coherent; Goal Directed; Linear  Descriptions of Associations:Intact  Orientation:Full (Time, Place and Person)  Thought Content:Logical  History of Schizophrenia/Schizoaffective disorder:No data recorded Duration of Psychotic Symptoms:No data recorded Hallucinations:Hallucinations: None  Ideas of  Reference:None  Suicidal Thoughts:Suicidal Thoughts: Yes, Active SI Active Intent and/or Plan: With Intent; With Plan; With Means to Carry Out; With Access to Means (plan to OD on pills.)  Homicidal Thoughts:Homicidal Thoughts: No   Sensorium  Memory: Immediate Good; Remote Fair  Judgment: Intact  Insight: Present   Executive Functions  Concentration: Good  Attention Span: Good  Recall: Fair  Fund of Knowledge: Good  Language: Good   Psychomotor Activity  Psychomotor Activity: Psychomotor Activity: Normal   Assets  Assets: Communication Skills; Desire for Improvement; Social Support    Sleep  Sleep: Sleep: Fair   Physical Exam: Physical Exam Vitals and nursing note reviewed.  Constitutional:      Appearance: Normal appearance.  HENT:     Head:  Normocephalic and atraumatic.     Nose: Nose normal.  Cardiovascular:     Rate and Rhythm: Normal rate and regular rhythm.  Pulmonary:     Effort: Pulmonary effort is normal.  Musculoskeletal:        General: Normal range of motion.     Cervical back: Normal range of motion.  Skin:    General: Skin is warm and dry.  Neurological:     General: No focal deficit present.     Mental Status: He is alert and oriented to person, place, and time.  Psychiatric:        Attention and Perception: Attention and perception normal.        Mood and Affect: Mood is anxious and depressed.        Speech: Speech normal.        Behavior: Behavior normal.        Thought Content: Thought content normal.        Cognition and Memory: Cognition and memory normal.        Judgment: Judgment normal.    Review of Systems  Constitutional: Negative.   HENT: Negative.    Eyes: Negative.   Respiratory: Negative.    Cardiovascular: Negative.   Gastrointestinal: Negative.   Genitourinary: Negative.   Musculoskeletal: Negative.   Skin: Negative.   Neurological: Negative.   Endo/Heme/Allergies: Negative.   Psychiatric/Behavioral:  Positive for depression and suicidal ideas. The patient is nervous/anxious.    Blood pressure 119/85, pulse 71, temperature 98.2 F (36.8 C), temperature source Oral, resp. rate 16, SpO2 100 %. There is no height or weight on file to calculate BMI.  Medical Decision Making: Patient meets criteria for inpatient psychiatry dual diagnosis treatment-Detox from alcohol and treatment for depression and Suicide ideation.  He reports previous suicide attempt by hanging.  Remains suicidal today.  We will seek bed placement while we start treating patient in the ER.  Patient is on Prozac 10 mg for depression and anxiety. Depakote DR 500 MG PO BID for mood -hx Bipolar disorder Gabapentin 300 mg po tid for mood   Problem 1: Recurrent Major Depressive disorder, severe without Psychotic  Features  Problem 2: Alcohol use disorder, severe use  Problem 3: Suicide ideation.  Disposition:  admit, seek bed placement  Earney Navy, NP-PMHNP-BC 03/04/2023 2:29 PM

## 2023-03-04 NOTE — ED Provider Notes (Signed)
[place] EMERGENCY DEPARTMENT AT [facility] Provider Note   CSN: [id number] Arrival date & time: 03/04/23  0847     History  Chief Complaint  Patient presents with   Suicidal   Depression    Dalton Molina is a 57 y.o. male.  57 yo M with a  cc of SI.  Going on for the past week.  Feels worthless.  Tired of living.  A friend of his called EMS last week and he declined transport.  Came of his own volition today.  He is also been suffering from an upper respiratory illness cough congestion going on for the past week or so.  No known sick contacts.  Some difficulty breathing at times.  Coughing up significant sputum.   Depression       Home Medications Prior to Admission medications   Medication Sig Start Date End Date Taking? Authorizing Provider  albuterol (PROVENTIL HFA;VENTOLIN HFA) 108 (90 Base) MCG/ACT inhaler Inhale 2 puffs into the lungs every 4 (four) hours as needed for wheezing or shortness of breath. 10/31/18   Dartha Lodge, PA-C  azithromycin (ZITHROMAX) 250 MG tablet Take 1 tablet (250 mg total) by mouth daily. Take first 2 tablets together, then 1 every day until finished. 10/31/18   Dartha Lodge, PA-C  divalproex (DEPAKOTE) 500 MG DR tablet Take 1 tablet (500 mg total) by mouth 2 (two) times daily. 09/07/16   Thermon Leyland, NP  gabapentin (NEURONTIN) 300 MG capsule Take 1 capsule (300 mg total) by mouth 3 (three) times daily. 09/07/16   Thermon Leyland, NP  ibuprofen (ADVIL,MOTRIN) 600 MG tablet Take 1 tablet (600 mg total) by mouth every 6 (six) hours as needed. 10/31/18   Dartha Lodge, PA-C  ipratropium (ATROVENT HFA) 17 MCG/ACT inhaler Inhale 2 puffs into the lungs every 4 (four) hours as needed for wheezing. 10/31/18   Dartha Lodge, PA-C      Allergies    Percocet [oxycodone-acetaminophen]    Review of Systems   Review of Systems  Psychiatric/Behavioral:  Positive for depression.     Physical Exam Updated Vital Signs BP 119/85 (BP  Location: Left Arm)   Pulse 71   Temp 98.2 F (36.8 C) (Oral)   Resp 16   SpO2 100%  Physical Exam Vitals and nursing note reviewed.  Constitutional:      Appearance: He is well-developed.  HENT:     Head: Normocephalic and atraumatic.  Eyes:     Pupils: Pupils are equal, round, and reactive to light.  Neck:     Vascular: No JVD.  Cardiovascular:     Rate and Rhythm: Normal rate and regular rhythm.     Heart sounds: No murmur heard.    No friction rub. No gallop.  Pulmonary:     Effort: No respiratory distress.     Breath sounds: Wheezing present.     Comments: Diffuse wheezes with prolonged expiratory effort.  Abdominal:     General: There is no distension.     Tenderness: There is no abdominal tenderness. There is no guarding or rebound.  Musculoskeletal:        General: Normal range of motion.     Cervical back: Normal range of motion and neck supple.  Skin:    Coloration: Skin is not pale.     Findings: No rash.  Neurological:     Mental Status: He is alert and oriented to person, place, and time.  Psychiatric:  Behavior: Behavior normal.     ED Results / Procedures / Treatments   Labs (all labs ordered are listed, but only abnormal results are displayed) Labs Reviewed  CBC WITH DIFFERENTIAL/PLATELET - Abnormal; Notable for the following components:      Result Value   Hemoglobin 18.1 (*)    HCT 52.1 (*)    All other components within normal limits  COMPREHENSIVE METABOLIC PANEL - Abnormal; Notable for the following components:   Sodium 134 (*)    AST 42 (*)    All other components within normal limits  ETHANOL - Abnormal; Notable for the following components:   Alcohol, Ethyl (B) 117 (*)    All other components within normal limits  ACETAMINOPHEN LEVEL - Abnormal; Notable for the following components:   Acetaminophen (Tylenol), Serum <10 (*)    All other components within normal limits  SALICYLATE LEVEL - Abnormal; Notable for the following  components:   Salicylate Lvl <7.0 (*)    All other components within normal limits  SARS CORONAVIRUS 2 BY RT PCR    EKG None  Radiology DG Chest Port 1 View  Result Date: 03/04/2023 CLINICAL DATA:  Cough EXAM: PORTABLE CHEST 1 VIEW COMPARISON:  07/23/2020 FINDINGS: The heart size and mediastinal contours are within normal limits. Chronically coarsened interstitial markings bilaterally. No focal airspace consolidation, pleural effusion, or pneumothorax. The visualized skeletal structures are unremarkable. IMPRESSION: Chronically coarsened interstitial markings bilaterally, may represent bronchitic lung changes. No focal airspace consolidation. Electronically Signed   By: Duanne Guess D.O.   On: 03/04/2023 09:43    Procedures Procedures    Medications Ordered in ED Medications  albuterol (VENTOLIN HFA) 108 (90 Base) MCG/ACT inhaler 2 puff (has no administration in time range)  divalproex (DEPAKOTE) DR tablet 500 mg (has no administration in time range)  gabapentin (NEURONTIN) capsule 300 mg (300 mg Oral Given 03/04/23 1136)  ipratropium (ATROVENT HFA) inhaler 2 puff (has no administration in time range)  ipratropium-albuterol (DUONEB) 0.5-2.5 (3) MG/3ML nebulizer solution 3 mL (3 mLs Nebulization Given 03/04/23 1010)  dexamethasone (DECADRON) tablet 10 mg (10 mg Oral Given 03/04/23 2130)    ED Course/ Medical Decision Making/ A&P                             Medical Decision Making Amount and/or Complexity of Data Reviewed Labs: ordered. Radiology: ordered.  Risk Prescription drug management.   57 yo M with a chief complaints of difficulty breathing and suicidal ideation.  Lungs most consistent with a COPD exacerbation.  Will give 3 DuoNebs back-to-back and steroids.  Chest x-ray independently interpreted by me without focal infiltrate. Covid test negative.  Patient feeling better on repeat assessment.  Will discharge home.  12:54 PM:  I have discussed the  diagnosis/risks/treatment options with the patient.  Evaluation and diagnostic testing in the emergency department does not suggest an emergent condition requiring admission or immediate intervention beyond what has been performed at this time.  They will follow up with PCP. We also discussed returning to the ED immediately if new or worsening sx occur. We discussed the sx which are most concerning (e.g., sudden worsening pain, fever, inability to tolerate by mouth) that necessitate immediate return. Medications administered to the patient during their visit and any new prescriptions provided to the patient are listed below.  Medications given during this visit Medications  albuterol (VENTOLIN HFA) 108 (90 Base) MCG/ACT inhaler 2 puff (has no administration  in time range)  divalproex (DEPAKOTE) DR tablet 500 mg (has no administration in time range)  gabapentin (NEURONTIN) capsule 300 mg (300 mg Oral Given 03/04/23 1136)  ipratropium (ATROVENT HFA) inhaler 2 puff (has no administration in time range)  ipratropium-albuterol (DUONEB) 0.5-2.5 (3) MG/3ML nebulizer solution 3 mL (3 mLs Nebulization Given 03/04/23 1010)  dexamethasone (DECADRON) tablet 10 mg (10 mg Oral Given 03/04/23 0955)     The patient appears reasonably screen and/or stabilized for discharge and I doubt any other medical condition or other [facility] requiring further screening, evaluation, or treatment in the ED at this time prior to discharge.            Final Clinical Impression(s) / ED Diagnoses Final diagnoses:  Suicidal ideation  Viral URI with cough    Rx / DC Orders ED Discharge Orders     None         Melene Plan, DO 03/04/23 1254

## 2023-03-04 NOTE — ED Notes (Signed)
Report given to [place], Charity fundraiser in [place].

## 2023-03-04 NOTE — Progress Notes (Signed)
Patient has been denied by [facility] due to no appropriate beds available. Patient meets BH inpatient criteria per Dahlia Byes, NP. Patient has been faxed out to the following facilities:   [facility]  [address] [place] [address] [phone]  [facility]  [address] [place] [address] [phone]  [facility]  [address] [place] [address] [phone]  [facility]  [address] [place] [address] [phone]  [facility]  [address] [place], [place] [address] [phone]  [facility]  [address] [place], [place] [address] [phone]  [facility]  [address] [place] [address] [phone]  [facility]  [address] [place] [address] [phone]  [facility]  [address], [place] [address] [phone]   Damita Dunnings, MSW, LCSW-A  8:44 PM 03/04/2023

## 2023-03-05 ENCOUNTER — Encounter (HOSPITAL_COMMUNITY): Payer: Self-pay | Admitting: Emergency Medicine

## 2023-03-05 DIAGNOSIS — F332 Major depressive disorder, recurrent severe without psychotic features: Secondary | ICD-10-CM

## 2023-03-05 NOTE — ED Notes (Signed)
Assumed pt care. Pt is A&Ox4, ambulatory independently. NAD noted. Pt very polite with staff, no aggressive behavior. Pt currently denies SI/HI, A/V Hallucinations. Pt currently denies any needs at this time. Will continue to monitor.

## 2023-03-05 NOTE — Discharge Summary (Signed)
[facility] Psych ED Discharge  03/05/2023 10:30 AM Dalton Molina  MRN:  [id number]  Principal Problem: MDD (major depressive disorder), recurrent severe, without psychosis Discharge Diagnoses: Principal Problem:   MDD (major depressive disorder), recurrent severe, without psychosis Active Problems:   Suicidal ideation   Alcohol dependence  Clinical Impression:  Final diagnoses:  Suicidal ideation  Viral URI with cough    ED Assessment Time Calculation: Start Time: 0945 Stop Time: 1000 Total Time in Minutes (Assessment Completion): 15    Subjective: On evaluation today, the patient is laying in his bed in no acute distress. He is calm and cooperative during this assessment. His appearance is appropriate for environment. His eye contact is good.  Speech is clear and coherent, normal pace and normal volume. He reports his mood is "good". Affect is congruent with mood. Thought process is coherent. Thought content is within normal limits. He denies auditory and visual hallucinations.  No indication that he is responding to internal stimuli during this assessment.  No delusions elicited during this assessment. He denies suicidal ideations. He denies homicidal ideations.  Patient states that instead of getting upset with the hotel management that he is living in, says that he would just go and speak with them about changing rooms to where there is no leakage.  Patient denies wanting any treatment for detox, states the only reason he has been drinking lately is due to being upset with the hotel property about not changing rooms.  Patient states his appetite and sleep are good, states he is ready to go home he feels better. Support, encouragement and reassurance provided about ongoing stressors and patient provided with opportunity for questions.     Past Psychiatric History:  Depression, Alcoholism,  Previous inpatient Psychiatry hospitalization at [facility] and Observation unit in 2017.  Past Social hx-Sex  Offender.   Past Medical History:  Past Medical History:  Diagnosis Date   Alcoholism /alcohol abuse    Atrial fibrillation    Bipolar disorder    COPD (chronic obstructive pulmonary disease)    Depression    GERD (gastroesophageal reflux disease)    Osteoarthritis    PTSD (post-traumatic stress disorder)    Scoliosis     Past Surgical History:  Procedure Laterality Date   APPENDECTOMY     Family History:  Family History  Problem Relation Age of Onset   Cancer Mother        bladder   Cancer Father        mesothelioma    Social History:  Social History   Substance and Sexual Activity  Alcohol Use Yes   Alcohol/week: 84.0 standard drinks of alcohol   Types: 84 Cans of beer per week   Comment: reports is an alcoholic     Social History   Substance and Sexual Activity  Drug Use No   Comment: Denies use    Social History   Socioeconomic History   Marital status: Single    Spouse name: Not on file   Number of children: Not on file   Years of education: Not on file   Highest education level: Not on file  Occupational History   Not on file  Tobacco Use   Smoking status: Every Day    Packs/day: 1.00    Years: 15.00    Additional pack years: 0.00    Total pack years: 15.00    Types: Cigarettes   Smokeless tobacco: Never   Tobacco comments:    Refused cessation materials  Substance  and Sexual Activity   Alcohol use: Yes    Alcohol/week: 84.0 standard drinks of alcohol    Types: 84 Cans of beer per week    Comment: reports is an alcoholic   Drug use: No    Comment: Denies use   Sexual activity: Yes  Other Topics Concern   Not on file  Social History Narrative   Not on file   Social Determinants of Health   Financial Resource Strain: Not on file  Food Insecurity: Not on file  Transportation Needs: Not on file  Physical Activity: Not on file  Stress: Not on file  Social Connections: Not on file    Tobacco Cessation:  A prescription for an  FDA-approved tobacco cessation medication was offered at discharge and the patient refused  Current Medications: Current Facility-Administered Medications  Medication Dose Route Frequency Provider Last Rate Last Admin   albuterol (VENTOLIN HFA) 108 (90 Base) MCG/ACT inhaler 2 puff  2 puff Inhalation Q4H PRN Melene Plan, DO       ipratropium (ATROVENT HFA) inhaler 2 puff  2 puff Inhalation Q4H PRN Melene Plan, DO       LORazepam (ATIVAN) tablet 1 mg  1 mg Oral Q8H PRN Dahlia Byes C, NP       Current Outpatient Medications  Medication Sig Dispense Refill   ADVIL 200 MG CAPS Take 200 mg by mouth every 6 (six) hours as needed (for pain or headaches).     albuterol (PROVENTIL HFA;VENTOLIN HFA) 108 (90 Base) MCG/ACT inhaler Inhale 2 puffs into the lungs every 4 (four) hours as needed for wheezing or shortness of breath. 1 Inhaler 0   GINKGO BILOBA PO Take 1 capsule by mouth 3 (three) times a week.     hydrOXYzine (ATARAX) 25 MG tablet Take 25 mg by mouth in the morning and at bedtime.     SYMBICORT 160-4.5 MCG/ACT inhaler Inhale 2 puffs into the lungs daily.     azithromycin (ZITHROMAX) 250 MG tablet Take 1 tablet (250 mg total) by mouth daily. Take first 2 tablets together, then 1 every day until finished. (Patient not taking: Reported on 03/04/2023) 6 tablet 0   divalproex (DEPAKOTE) 500 MG DR tablet Take 1 tablet (500 mg total) by mouth 2 (two) times daily. (Patient not taking: Reported on 03/04/2023) 28 tablet 0   gabapentin (NEURONTIN) 300 MG capsule Take 1 capsule (300 mg total) by mouth 3 (three) times daily. (Patient not taking: Reported on 03/04/2023) 42 capsule 0   ibuprofen (ADVIL,MOTRIN) 600 MG tablet Take 1 tablet (600 mg total) by mouth every 6 (six) hours as needed. (Patient not taking: Reported on 03/04/2023) 30 tablet 0   ipratropium (ATROVENT HFA) 17 MCG/ACT inhaler Inhale 2 puffs into the lungs every 4 (four) hours as needed for wheezing. (Patient not taking: Reported on 03/04/2023) 1  Inhaler 0   PTA Medications: (Not in a hospital admission)   [place] Scale:  Flowsheet Row ED from 03/04/2023 in [facility] Emergency Department at [facility]  C-SSRS RISK CATEGORY No Risk       Musculoskeletal: Strength & Muscle Tone: within normal limits Gait & Station: normal Patient leans: N/A  Psychiatric Specialty Exam: Presentation  General Appearance:  Appropriate for Environment  Eye Contact: Good  Speech: Clear and Coherent  Speech Volume: Normal  Handedness:No data recorded  Mood and Affect  Mood: Euthymic  Affect: Appropriate   Thought Process  Thought Processes: Coherent  Descriptions of Associations:Intact  Orientation:Full (Time, Place  and Person)  Thought Content:Logical  History of Schizophrenia/Schizoaffective disorder:No data recorded Duration of Psychotic Symptoms:No data recorded Hallucinations:Hallucinations: None  Ideas of Reference:None  Suicidal Thoughts:Suicidal Thoughts: No SI Active Intent and/or Plan: With Intent; With Plan; With Means to Carry Out; With Access to Means (plan to OD on pills.)  Homicidal Thoughts:Homicidal Thoughts: No   Sensorium  Memory: Immediate Good; Recent Good; Recent Poor  Judgment: Fair  Insight: Good   Executive Functions  Concentration: Good  Attention Span: Good  Recall: Good  Fund of Knowledge: Good  Language: Good   Psychomotor Activity  Psychomotor Activity: Psychomotor Activity: Normal   Assets  Assets: Communication Skills; Financial Resources/Insurance; Social Support   Sleep  Sleep: Sleep: Good    Physical Exam: Physical Exam HENT:     Nose: Nose normal.  Musculoskeletal:        General: Normal range of motion.     Cervical back: Normal range of motion.  Neurological:     Mental Status: He is alert.  Psychiatric:        Attention and Perception: Attention normal.        Mood and Affect: Mood normal.        Speech: Speech  normal.        Behavior: Behavior is cooperative.        Thought Content: Thought content normal.        Cognition and Memory: Cognition normal.        Judgment: Judgment normal.    Review of Systems  Constitutional: Negative.   HENT: Negative.    Musculoskeletal: Negative.   Psychiatric/Behavioral:  Positive for substance abuse.    Blood pressure 132/89, pulse 71, temperature 98 F (36.7 C), temperature source Oral, resp. rate 16, SpO2 100 %. There is no height or weight on file to calculate BMI.   Demographic Factors:  Male, Caucasian, and Living alone  Loss Factors: Financial problems/change in socioeconomic status  Historical Factors: Impulsivity and NA  Risk Reduction Factors:   Positive social support  Continued Clinical Symptoms:  Depression:   Anhedonia Alcohol/Substance Abuse/Dependencies  Cognitive Features That Contribute To Risk:  None    Suicide Risk:  Minimal: No identifiable suicidal ideation.  Patients presenting with no risk factors but with morbid ruminations; may be classified as minimal risk based on the severity of the depressive symptoms   Medical Decision Making: Patient is psychiatrically cleared. Patient case review and discussed with Dr. Lucianne Muss, and patient does not meet inpatient criteria for inpatient psychiatric treatment. At time of discharge, patient denies SI, HI, AVH and can contract for safety. He demonstrated no overt evidence of psychosis or mania. Prior to discharge, he verbalized that they understood warning signs, triggers, and symptoms of worsening mental health and how to access emergency mental health care if they felt it was needed. Patient was instructed to call 911 or return to the emergency room if they experienced any concerning symptoms after discharge. Patient voiced understanding and agreed to the above.  Patient given resources to follow up with behavioral health urgent care for therapy and medication management. Patient  denies access to weapons. Safety planning completed.     Disposition: Data Unavailable  Keshanna Riso MOTLEY-MANGRUM, PMHNP 03/05/2023, 10:30 AM

## 2023-03-05 NOTE — ED Notes (Signed)
Psych at bedside at this time.

## 2023-03-05 NOTE — Discharge Instructions (Addendum)
Follow-up with the recommendations provided by the behavioral health team   Discharge recommendations:  Patient is to take medications as prescribed. Please see information for follow-up appointment with psychiatry and therapy. Please follow up with your primary care provider for all medical related needs.   Therapy: We recommend that patient participate in individual therapy to address mental health concerns.  Medications: The patient or guardian is to contact a medical professional and/or outpatient provider to address any new side effects that develop. The patient or guardian should update outpatient providers of any new medications and/or medication changes.   Atypical antipsychotics: If you are prescribed an atypical antipsychotic, it is recommended that your height, weight, BMI, blood pressure, fasting lipid panel, and fasting blood sugar be monitored by your outpatient providers.  Safety:  The patient should abstain from use of illicit substances/drugs and abuse of any medications. If symptoms worsen or do not continue to improve or if the patient becomes actively suicidal or homicidal then it is recommended that the patient return to the closest hospital emergency department, the [facility], or call 911 for further evaluation and treatment. National Suicide Prevention Lifeline 1-800-SUICIDE or (639) 372-0737.  About 988 988 offers 24/7 access to trained crisis counselors who can help people experiencing mental health-related distress. People can call or text 988 or chat 988lifeline.org for themselves or if they are worried about a loved one who may need crisis support.  Crisis Mobile: [facility]:                     [phone] (for crisis response 24 hours a day) [facility] [employer] [facility] Hotline:                                            854-214-7086

## 2023-03-05 NOTE — ED Provider Notes (Addendum)
Patient was seen by the psychiatry team this morning.  Notified by NP Motley Mangrum.  Pt is stable for discharge and outpatient management       Linwood Dibbles, MD 03/05/23 1031

## 2023-04-23 LAB — COLOGUARD

## 2024-04-01 ENCOUNTER — Encounter: Payer: Self-pay | Admitting: Orthopedic Surgery

## 2024-04-01 ENCOUNTER — Ambulatory Visit: Payer: Self-pay | Admitting: Orthopedic Surgery

## 2024-04-01 VITALS — BP 128/84 | HR 95 | Temp 97.1°F | Resp 17 | Ht 69.0 in | Wt 292.4 lb

## 2024-04-01 DIAGNOSIS — R29818 Other symptoms and signs involving the nervous system: Secondary | ICD-10-CM

## 2024-04-01 DIAGNOSIS — F259 Schizoaffective disorder, unspecified: Secondary | ICD-10-CM

## 2024-04-01 DIAGNOSIS — M10021 Idiopathic gout, right elbow: Secondary | ICD-10-CM

## 2024-04-01 DIAGNOSIS — Z114 Encounter for screening for human immunodeficiency virus [HIV]: Secondary | ICD-10-CM

## 2024-04-01 DIAGNOSIS — J418 Mixed simple and mucopurulent chronic bronchitis: Secondary | ICD-10-CM

## 2024-04-01 DIAGNOSIS — F1010 Alcohol abuse, uncomplicated: Secondary | ICD-10-CM

## 2024-04-01 DIAGNOSIS — R002 Palpitations: Secondary | ICD-10-CM

## 2024-04-01 DIAGNOSIS — F17210 Nicotine dependence, cigarettes, uncomplicated: Secondary | ICD-10-CM

## 2024-04-01 DIAGNOSIS — Z1159 Encounter for screening for other viral diseases: Secondary | ICD-10-CM

## 2024-04-01 NOTE — Progress Notes (Unsigned)
 Careteam: Patient Care Team: System, Provider Not In as PCP - General  Seen by: Ulyses Gandy, AGNP-C  PLACE OF SERVICE:  [facility]  Advanced Directive information Does Patient Have a Medical Advance Directive?: No, Would patient like information on creating a medical advance directive?: No - Patient declined  Allergies  Allergen Reactions   Percocet [Oxycodone-Acetaminophen ] Anaphylaxis and Rash   Latex Other (See Comments)    "Hands sweat profusely" (gloves)    Chief Complaint  Patient presents with   Establish Care    New patient.      HPI: Patient is a 58 y.o. male seen today to establish with [facility].   Previous provider was [place] at [facility]. He was recently seen by Dr. Valaria Garland as new patient encounter. Originally from [place]. Single. No children. Disability since 09/2017 due to COPD, heart disease, PTSD. No support systems.   No MI, T2DM, cancers.   Suicidal ideation 03/04/2023> AA sponsor called AA out of concern.   Schizoaffective disorder> followed by Libr[facility] Reef in past> not on medication at this time> moved to [facility] and mood has improved  Not part of AA group. Last alcoholic beverage yesterday> averaging 3-4 beers daily.   COPD- diagnosed about 10 years ago, not followed by pulmonary, remains on albuterol  or Symbicort, bad cough in AM, admits to smoking about 1.5 PPD> now reduced to 1 PPD, unsuccessful trial of nicotine  patches/lozenges,   Surgeries: Appendectomy at age 56-14  Colonoscopy never done> cologuard done about earlier this year  Mother passed at 45> T2DM, bladder cancer, heavy smoker Father passed at age 65> mesothelioma 1/2 siblings Maternal grandfather> cirrhosis, heavy drinker Maternal uncle> liver transplant, heavy drinker  Diet: depends on food stamps, microwave meals, tries to follow low sodium Exercise: does not follow any regimen   Review of Systems:  ROS***  Past Medical History:  Diagnosis Date    Alcoholism /alcohol  abuse    Atrial fibrillation (HCC)    Bipolar disorder (HCC)    COPD (chronic obstructive pulmonary disease) (HCC)    Depression    GERD (gastroesophageal reflux disease)    Osteoarthritis    PTSD (post-traumatic stress disorder)    Scoliosis    Past Surgical History:  Procedure Laterality Date   APPENDECTOMY     Social History:   reports that he has been smoking cigarettes. He has a 15 pack-year smoking history. He has never used smokeless tobacco. He reports current alcohol  use of about 14.0 standard drinks of alcohol  per week. He reports that he does not use drugs.  Family History  Problem Relation Age of Onset   Cancer Mother        bladder   Cancer Father        mesothelioma    Medications: Patient's Medications  New Prescriptions   No medications on file  Previous Medications   ADVIL  200 MG CAPS    Take 200 mg by mouth every 6 (six) hours as needed (for pain or headaches).   ALBUTEROL  (PROVENTIL  HFA;VENTOLIN  HFA) 108 (90 BASE) MCG/ACT INHALER    Inhale 2 puffs into the lungs every 4 (four) hours as needed for wheezing or shortness of breath.   AZITHROMYCIN  (ZITHROMAX ) 250 MG TABLET    Take 1 tablet (250 mg total) by mouth daily. Take first 2 tablets together, then 1 every day until finished.   DIVALPROEX  (DEPAKOTE ) 500 MG DR TABLET    Take 1 tablet (500 mg total) by mouth 2 (two) times  daily.   GABAPENTIN  (NEURONTIN ) 300 MG CAPSULE    Take 1 capsule (300 mg total) by mouth 3 (three) times daily.   GARLIC PO    Take 1 capsule by mouth daily.   GINKGO BILOBA PO    Take 1 capsule by mouth daily.   HYDROXYZINE  (ATARAX ) 25 MG TABLET    Take 25 mg by mouth in the morning and at bedtime.   IBUPROFEN  (ADVIL ,MOTRIN ) 600 MG TABLET    Take 1 tablet (600 mg total) by mouth every 6 (six) hours as needed.   IPRATROPIUM (ATROVENT  HFA) 17 MCG/ACT INHALER    Inhale 2 puffs into the lungs every 4 (four) hours as needed for wheezing.   MULTIPLE VITAMIN (MULTIVITAMIN PO)     Take 1 capsule by mouth daily.   SYMBICORT 160-4.5 MCG/ACT INHALER    Inhale 2 puffs into the lungs daily.   THIAMINE  (VITAMIN B-1) 100 MG TABLET    Take 100 mg by mouth daily.  Modified Medications   No medications on file  Discontinued Medications   No medications on file    Physical Exam:  Vitals:   04/01/24 1429  BP: (!) 150/110  Pulse: 95  Resp: 17  Temp: (!) 97.1 F (36.2 C)  SpO2: 95%  Weight: 292 lb 6.4 oz (132.6 kg)  Height: 5\' 9"  (1.753 m)   Body mass index is 43.18 kg/m. Wt Readings from Last 3 Encounters:  04/01/24 292 lb 6.4 oz (132.6 kg)  06/05/17 250 lb (113.4 kg)  03/13/17 248 lb (112.5 kg)    Physical Exam***  Labs reviewed: Basic Metabolic Panel: No results for input(s): "NA", "K", "CL", "CO2", "GLUCOSE", "BUN", "CREATININE", "CALCIUM", "MG", "PHOS", "TSH" in the last 8760 hours. Liver Function Tests: No results for input(s): "AST", "ALT", "ALKPHOS", "BILITOT", "PROT", "ALBUMIN" in the last 8760 hours. No results for input(s): "LIPASE", "AMYLASE" in the last 8760 hours. No results for input(s): "AMMONIA" in the last 8760 hours. CBC: No results for input(s): "WBC", "NEUTROABS", "HGB", "HCT", "MCV", "PLT" in the last 8760 hours. Lipid Panel: No results for input(s): "CHOL", "HDL", "LDLCALC", "TRIG", "CHOLHDL", "LDLDIRECT" in the last 8760 hours. TSH: No results for input(s): "TSH" in the last 8760 hours. A1C: No results found for: "HGBA1C"   Assessment/Plan There are no diagnoses linked to this encounter.  Next appt: *** Ingram Onnen Darral Ellis  [facility] [phone]

## 2024-04-01 NOTE — Patient Instructions (Addendum)
 [facility] for alcoholic abuse > [phone]  [facility]> [phone]  Referral made to [facility]   Please take blood pressure twice daily and bring recordings with you next visit > every morning and evening> do not take when you are stressed or in pain   Goal blood pressure< 140/90

## 2024-04-02 ENCOUNTER — Ambulatory Visit: Payer: Self-pay | Admitting: Orthopedic Surgery

## 2024-04-02 DIAGNOSIS — E118 Type 2 diabetes mellitus with unspecified complications: Secondary | ICD-10-CM

## 2024-04-02 NOTE — Telephone Encounter (Signed)
 Copied from CRM [id number]. Topic: Clinical - Lab/Test Results >> Apr 02, 2024 11:27 AM Tisa Forester wrote: Reason for CRM: returning call received voice mail about his lab results ,went over lab results with patient per Arnetha Bhat notes , patient would like to go over his labs with someone  Patient would like a callback at [phone]

## 2024-04-05 ENCOUNTER — Telehealth: Payer: Self-pay | Admitting: *Deleted

## 2024-04-05 NOTE — Telephone Encounter (Signed)
 Copied from CRM [id number]. Topic: Clinical - Lab/Test Results >> Apr 02, 2024 11:27 AM Tisa Forester wrote: Reason for CRM: returning call received voice mail about his lab results ,went over lab results with patient per Arnetha Bhat notes , patient would like to go over his labs with someone  Patient would like a callback at [phone]

## 2024-04-05 NOTE — Telephone Encounter (Signed)
 Patient has been notified.   Arnetha Bhat, NP 04/02/2024  2:33 PM EDT Back to Top    Lab work discussed with patient via telephone. A1c pending. No further action at this time.

## 2024-04-06 ENCOUNTER — Other Ambulatory Visit: Payer: Self-pay

## 2024-04-06 DIAGNOSIS — E118 Type 2 diabetes mellitus with unspecified complications: Secondary | ICD-10-CM

## 2024-04-06 LAB — CBC WITH DIFFERENTIAL/PLATELET
Absolute Lymphocytes: 3009 {cells}/uL (ref 850–3900)
Absolute Monocytes: 607 {cells}/uL (ref 200–950)
Basophils Absolute: 33 {cells}/uL (ref 0–200)
Basophils Relative: 0.4 %
Eosinophils Absolute: 410 {cells}/uL (ref 15–500)
Eosinophils Relative: 5 %
HCT: 49.7 % (ref 38.5–50.0)
Hemoglobin: 16.9 g/dL (ref 13.2–17.1)
MCH: 34 pg — ABNORMAL HIGH (ref 27.0–33.0)
MCHC: 34 g/dL (ref 32.0–36.0)
MCV: 100 fL (ref 80.0–100.0)
MPV: 11.2 fL (ref 7.5–12.5)
Monocytes Relative: 7.4 %
Neutro Abs: 4141 {cells}/uL (ref 1500–7800)
Neutrophils Relative %: 50.5 %
Platelets: 225 10*3/uL (ref 140–400)
RBC: 4.97 10*6/uL (ref 4.20–5.80)
RDW: 12.7 % (ref 11.0–15.0)
Total Lymphocyte: 36.7 %
WBC: 8.2 10*3/uL (ref 3.8–10.8)

## 2024-04-06 LAB — HEMOGLOBIN A1C
Hgb A1c MFr Bld: 7.3 % — ABNORMAL HIGH (ref <5.7)
Mean Plasma Glucose: 163 mg/dL
eAG (mmol/L): 9 mmol/L

## 2024-04-06 LAB — COMPLETE METABOLIC PANEL WITHOUT GFR
AG Ratio: 1.6 (calc) (ref 1.0–2.5)
ALT: 37 U/L (ref 9–46)
AST: 33 U/L (ref 10–35)
Albumin: 4.2 g/dL (ref 3.6–5.1)
Alkaline phosphatase (APISO): 58 U/L (ref 35–144)
BUN: 13 mg/dL (ref 7–25)
CO2: 29 mmol/L (ref 20–32)
Calcium: 10 mg/dL (ref 8.6–10.3)
Chloride: 99 mmol/L (ref 98–110)
Creat: 1.14 mg/dL (ref 0.70–1.30)
Globulin: 2.6 g/dL (ref 1.9–3.7)
Glucose, Bld: 149 mg/dL — ABNORMAL HIGH (ref 65–139)
Potassium: 4.6 mmol/L (ref 3.5–5.3)
Sodium: 140 mmol/L (ref 135–146)
Total Bilirubin: 0.4 mg/dL (ref 0.2–1.2)
Total Protein: 6.8 g/dL (ref 6.1–8.1)

## 2024-04-06 LAB — TSH: TSH: 1.99 m[IU]/L (ref 0.40–4.50)

## 2024-04-06 LAB — TEST AUTHORIZATION 2

## 2024-04-06 LAB — URIC ACID: Uric Acid, Serum: 7.8 mg/dL (ref 4.0–8.0)

## 2024-04-06 LAB — HEPATITIS C ANTIBODY: Hepatitis C Ab: NONREACTIVE

## 2024-04-06 LAB — HIV ANTIBODY (ROUTINE TESTING W REFLEX): HIV 1&2 Ab, 4th Generation: NONREACTIVE

## 2024-04-06 MED ORDER — METFORMIN HCL ER 500 MG PO TB24: 500.0000 mg | ORAL_TABLET | Freq: Every day | ORAL | 1 refills | Status: DC

## 2024-04-06 MED ORDER — METFORMIN HCL ER 500 MG PO TB24
500.0000 mg | ORAL_TABLET | Freq: Every day | ORAL | 1 refills | Status: DC
Start: 2024-04-06 — End: 2024-04-06

## 2024-04-29 ENCOUNTER — Encounter: Payer: Self-pay | Admitting: Orthopedic Surgery

## 2024-04-29 ENCOUNTER — Ambulatory Visit (INDEPENDENT_AMBULATORY_CARE_PROVIDER_SITE_OTHER): Admitting: Orthopedic Surgery

## 2024-04-29 VITALS — BP 138/88 | HR 93 | Temp 97.4°F | Resp 17 | Ht 69.0 in | Wt 286.6 lb

## 2024-04-29 DIAGNOSIS — I10 Essential (primary) hypertension: Secondary | ICD-10-CM

## 2024-04-29 DIAGNOSIS — J418 Mixed simple and mucopurulent chronic bronchitis: Secondary | ICD-10-CM | POA: Diagnosis not present

## 2024-04-29 DIAGNOSIS — F17210 Nicotine dependence, cigarettes, uncomplicated: Secondary | ICD-10-CM

## 2024-04-29 DIAGNOSIS — E1165 Type 2 diabetes mellitus with hyperglycemia: Secondary | ICD-10-CM

## 2024-04-29 DIAGNOSIS — Z23 Encounter for immunization: Secondary | ICD-10-CM

## 2024-04-29 MED ORDER — SYMBICORT 160-4.5 MCG/ACT IN AERO
2.0000 | INHALATION_SPRAY | Freq: Every day | RESPIRATORY_TRACT | 2 refills | Status: DC
Start: 2024-04-29 — End: 2024-08-05

## 2024-04-29 MED ORDER — ALBUTEROL SULFATE HFA 108 (90 BASE) MCG/ACT IN AERS
2.0000 | INHALATION_SPRAY | RESPIRATORY_TRACT | 2 refills | Status: DC | PRN
Start: 2024-04-29 — End: 2024-08-05

## 2024-04-29 MED ORDER — LISINOPRIL 2.5 MG PO TABS
2.5000 mg | ORAL_TABLET | Freq: Every day | ORAL | 1 refills | Status: DC
Start: 2024-04-29 — End: 2024-07-16

## 2024-04-29 NOTE — Patient Instructions (Addendum)
 Diet low in carbs and sugars  Continue weight loss efforts> you are off to a great start!!!  Schedule CT chest for lung cancer prevention with [facility]> they will call you to schedule  Try to cut down on smoking   Cinsider Zyrtec  for congestion/wheezing

## 2024-04-29 NOTE — Progress Notes (Signed)
 Careteam: Patient Care Team: Arnetha Bhat, NP as PCP - General (Adult Health Nurse Practitioner)  Seen by: Ulyses Gandy, AGNP-C  PLACE OF SERVICE:  [facility]  Advanced Directive information    Allergies  Allergen Reactions   Percocet [Oxycodone-Acetaminophen ] Anaphylaxis and Rash   Latex Other (See Comments)    Hands sweat profusely (gloves)    Chief Complaint  Patient presents with   Follow-up    4 week new patient follow up. Discuss the need for Pne vaccine, Covid Booster, DTAP vaccine, Shingrix vaccine, AWV, Diabetic kidney evaluation, and Colonoscopy.      HPI: Patient is a 58 y.o. male seen today for medical management of chronic conditions.   Recent A1c was 7.3. He was started on metformin  500 mg daily. He is taking medication as prescribed. Denies SE.   He has lost 6 lbs since T2DM diagnosis. He admits to eating more protein and walking more.   Prevnar 20 given today. He continues to smoke. No plans to quit at this time. He has been a heavy smoker > 20 years. He is agreeable for CT chest r/o lung cancer. Intermittent wheezing and cough ongoing. Remains on albuterol  prn and Symbicort daily.   Taking home blood pressures with wrist cuff. He brought some readings today. SBP varies. In office BP increased, rechecked borderline. Agreeable to start low dose lisinopril for kidney protection/ increased blood pressure.   Concerned about under performing last sexual encounter. He was drinking when episode occurred. H/o chronic alcohol  abuse. He is trying to reduce intake due to recent T2DM diagnosis. Admits to drinking Natural Light beer.   He did have some depression after recent diabetes diagnosis. Reports improved mood today.   Review of Systems:  Review of Systems  Constitutional: Negative.   HENT: Negative.    Respiratory:  Positive for cough, shortness of breath and wheezing.   Cardiovascular: Negative.   Gastrointestinal: Negative.   Genitourinary: Negative.    Musculoskeletal: Negative.   Neurological: Negative.   Psychiatric/Behavioral:  Positive for depression.     Past Medical History:  Diagnosis Date   Alcoholism /alcohol  abuse    Atrial fibrillation (HCC)    Bipolar disorder (HCC)    COPD (chronic obstructive pulmonary disease) (HCC)    Depression    GERD (gastroesophageal reflux disease)    Osteoarthritis    PTSD (post-traumatic stress disorder)    Scoliosis    Past Surgical History:  Procedure Laterality Date   APPENDECTOMY     Social History:   reports that he has been smoking cigarettes. He has a 15 pack-year smoking history. He has never used smokeless tobacco. He reports current alcohol  use of about 7.0 standard drinks of alcohol  per week. He reports that he does not use drugs.  Family History  Problem Relation Age of Onset   Cancer Mother        bladder   Cancer Father        mesothelioma    Medications: Patient's Medications  New Prescriptions   LISINOPRIL (ZESTRIL) 2.5 MG TABLET    Take 1 tablet (2.5 mg total) by mouth daily.  Previous Medications   GARLIC PO    Take 1 capsule by mouth daily.   GINKGO BILOBA PO    Take 1 capsule by mouth daily.   METFORMIN  (GLUCOPHAGE -XR) 500 MG 24 HR TABLET    Take 1 tablet (500 mg total) by mouth daily with breakfast.   MULTIPLE VITAMIN (MULTIVITAMIN PO)    Take  1 capsule by mouth daily.   THIAMINE  (VITAMIN B-1) 100 MG TABLET    Take 100 mg by mouth daily.  Modified Medications   Modified Medication Previous Medication   ALBUTEROL  (VENTOLIN  HFA) 108 (90 BASE) MCG/ACT INHALER albuterol  (PROVENTIL  HFA;VENTOLIN  HFA) 108 (90 Base) MCG/ACT inhaler      Inhale 2 puffs into the lungs every 4 (four) hours as needed for wheezing or shortness of breath.    Inhale 2 puffs into the lungs every 4 (four) hours as needed for wheezing or shortness of breath.   SYMBICORT 160-4.5 MCG/ACT INHALER SYMBICORT 160-4.5 MCG/ACT inhaler      Inhale 2 puffs into the lungs daily.    Inhale 2 puffs into  the lungs daily.  Discontinued Medications   No medications on file    Physical Exam:  Vitals:   04/29/24 1327 04/29/24 1428  BP: (!) 144/102 138/88  Pulse: 93   Resp: 17   Temp: (!) 97.4 F (36.3 C)   SpO2: 95%   Weight: 286 lb 9.6 oz (130 kg)   Height: 5' 9 (1.753 m)    Body mass index is 42.32 kg/m. Wt Readings from Last 3 Encounters:  04/29/24 286 lb 9.6 oz (130 kg)  04/01/24 292 lb 6.4 oz (132.6 kg)  06/05/17 250 lb (113.4 kg)    Physical Exam Vitals reviewed.  Constitutional:      General: He is not in acute distress.    Appearance: He is obese.  HENT:     Head: Normocephalic.   Eyes:     General:        Right eye: No discharge.        Left eye: No discharge.    Cardiovascular:     Rate and Rhythm: Normal rate and regular rhythm.     Pulses: Normal pulses.     Heart sounds: Normal heart sounds.  Pulmonary:     Effort: Pulmonary effort is normal. No respiratory distress.     Breath sounds: Wheezing present. No rhonchi or rales.  Abdominal:     General: There is no distension.     Palpations: Abdomen is soft. There is no mass.     Tenderness: There is no abdominal tenderness. There is no guarding or rebound.     Hernia: No hernia is present.   Musculoskeletal:     Cervical back: Neck supple.     Right lower leg: No edema.     Left lower leg: No edema.   Skin:    General: Skin is warm.     Capillary Refill: Capillary refill takes less than 2 seconds.   Neurological:     General: No focal deficit present.     Mental Status: He is alert and oriented to person, place, and time.   Psychiatric:        Mood and Affect: Mood normal.     Labs reviewed: Basic Metabolic Panel: Recent Labs    04/01/24 1552  NA 140  K 4.6  CL 99  CO2 29  GLUCOSE 149*  BUN 13  CREATININE 1.14  CALCIUM 10.0  TSH 1.99   Liver Function Tests: Recent Labs    04/01/24 1552  AST 33  ALT 37  BILITOT 0.4  PROT 6.8   No results for input(s): LIPASE,  AMYLASE in the last 8760 hours. No results for input(s): AMMONIA in the last 8760 hours. CBC: Recent Labs    04/01/24 1552  WBC 8.2  NEUTROABS 4,141  HGB  16.9  HCT 49.7  MCV 100.0  PLT 225   Lipid Panel: No results for input(s): CHOL, HDL, LDLCALC, TRIG, CHOLHDL, LDLDIRECT in the last 8760 hours. TSH: Recent Labs    04/01/24 1552  TSH 1.99   A1C: Lab Results  Component Value Date   HGBA1C 7.3 (H) 04/01/2024     Assessment/Plan 1. Need for pneumococcal 20-valent conjugate vaccination (Primary) - Pneumococcal conjugate vaccine 20-valent (Prevnar 20)  2. Immunization due - Pneumococcal conjugate vaccine 20-valent (Prevnar 20)  3. Type 2 diabetes mellitus with hyperglycemia, without long-term current use of insulin (HCC) - recent A1c 7.3 - tolerating metformin  well - weight down 6 lbs - repeat A1c in 6 months - will start low dose ACE for kidney protection - Microalbumin/Creatinine Ratio, Urine  4. Primary hypertension - BUN/creat 13/1.14 04/01/2024 - uncontrolled pressures at times - asymptomatic - start low dose lisinopril for mild BP elevation and T2DM - lisinopril (ZESTRIL) 2.5 MG tablet; Take 1 tablet (2.5 mg total) by mouth daily.  Dispense: 90 tablet; Refill: 1  5. Mixed simple and mucopurulent chronic bronchitis (HCC) - ongoing - continues to smoke cigarettes - mild wheezing on exam - albuterol  (VENTOLIN  HFA) 108 (90 Base) MCG/ACT inhaler; Inhale 2 puffs into the lungs every 4 (four) hours as needed for wheezing or shortness of breath.  Dispense: 1 each; Refill: 2 - SYMBICORT 160-4.5 MCG/ACT inhaler; Inhale 2 puffs into the lungs daily.  Dispense: 1 each; Refill: 2  6. Tobacco dependence due to cigarettes - heavy smoking history > 20 years  - CT CHEST LUNG CA SCREEN LOW DOSE W/O CM; Future  Total time: 36 minutes. Greater than 50% of total time spent doing patient education regarding health maintenance, T2DM, HTN, weight loss, diabetic  diet, risk of smoking including symptom/medication management.    Next appt: 08/05/2024  Ulyses Gandy, Joyice Nodal  [facility] [phone]

## 2024-04-30 ENCOUNTER — Ambulatory Visit: Payer: Self-pay | Admitting: Orthopedic Surgery

## 2024-04-30 LAB — MICROALBUMIN / CREATININE URINE RATIO
Creatinine, Urine: 165 mg/dL (ref 20–320)
Microalb Creat Ratio: 3 mg/g{creat} (ref <30)
Microalb, Ur: 0.5 mg/dL

## 2024-05-19 ENCOUNTER — Telehealth (HOSPITAL_COMMUNITY): Payer: Self-pay | Admitting: Licensed Clinical Social Worker

## 2024-05-19 NOTE — Telephone Encounter (Signed)
 The therapist calls Dalton Molina concerning a referral received from his NP, Ms. Amy Fargo.  He says that he is not feeling well today as he has been out in the heat adding that he has COPD and heart failure. [name] takes down this therapist's direct contact number which is [phone] indicating that he will call back when feeling better.  Zell Maier, MA, LCSW, [facility], LCAS 05/19/2024

## 2024-06-10 ENCOUNTER — Encounter: Admitting: Orthopedic Surgery

## 2024-06-11 NOTE — Progress Notes (Signed)
 This encounter was created in error - please disregard.

## 2024-07-01 ENCOUNTER — Ambulatory Visit (INDEPENDENT_AMBULATORY_CARE_PROVIDER_SITE_OTHER): Admitting: Orthopedic Surgery

## 2024-07-01 ENCOUNTER — Encounter: Payer: Self-pay | Admitting: Orthopedic Surgery

## 2024-07-01 VITALS — BP 134/94 | HR 88 | Temp 96.9°F | Resp 17 | Ht 69.0 in | Wt 278.8 lb

## 2024-07-01 DIAGNOSIS — Z0000 Encounter for general adult medical examination without abnormal findings: Secondary | ICD-10-CM

## 2024-07-01 DIAGNOSIS — Z1211 Encounter for screening for malignant neoplasm of colon: Secondary | ICD-10-CM | POA: Diagnosis not present

## 2024-07-01 NOTE — Progress Notes (Signed)
 Subjective:   Dalton Molina is a 58 y.o. male who presents for Medicare Annual/Subsequent preventive examination.  Visit Complete: In person  Patient Medicare AWV questionnaire was completed by the patient on 07/01/2024; I have confirmed that all information answered by patient is correct and no changes since this date.  Cardiac Risk Factors include: advanced age (>33men, >6 women);hypertension;male gender;sedentary lifestyle;obesity (BMI >30kg/m2);diabetes mellitus;dyslipidemia     Objective:    Today's Vitals   07/01/24 1106  BP: (!) 134/94  Pulse: 88  Resp: 17  Temp: (!) 96.9 F (36.1 C)  SpO2: 95%  Weight: 278 lb 12.8 oz (126.5 kg)  Height: 5' 9 (1.753 m)   Body mass index is 41.17 kg/m.     04/01/2024    2:38 PM 03/04/2023    9:15 AM 06/05/2017    5:23 AM 06/05/2017   12:47 AM 03/13/2017   11:35 AM 02/06/2017   12:23 PM 09/27/2016    6:40 PM  Advanced Directives  Does Patient Have a Medical Advance Directive? No No No  No  No  No  No   Would patient like information on creating a medical advance directive? No - Patient declined No - Patient declined No - Patient declined  No - Patient declined   No - Patient declined  No - patient declined information      Data saved with a previous flowsheet row definition    Current Medications (verified) Outpatient Encounter Medications as of 07/01/2024  Medication Sig   GINKGO BILOBA PO Take 1 capsule by mouth daily.   lisinopril  (ZESTRIL ) 2.5 MG tablet Take 1 tablet (2.5 mg total) by mouth daily.   metFORMIN  (GLUCOPHAGE -XR) 500 MG 24 hr tablet Take 1 tablet (500 mg total) by mouth daily with breakfast.   Multiple Vitamin (MULTIVITAMIN PO) Take 1 capsule by mouth daily.   SYMBICORT  160-4.5 MCG/ACT inhaler Inhale 2 puffs into the lungs daily.   thiamine  (VITAMIN B-1) 100 MG tablet Take 100 mg by mouth daily.   albuterol  (VENTOLIN  HFA) 108 (90 Base) MCG/ACT inhaler Inhale 2 puffs into the lungs every 4 (four) hours as needed  for wheezing or shortness of breath.   No facility-administered encounter medications on file as of 07/01/2024.    Allergies (verified) Percocet [oxycodone-acetaminophen ] and Latex   History: Past Medical History:  Diagnosis Date   Alcoholism /alcohol  abuse    Atrial fibrillation (HCC)    Bipolar disorder (HCC)    COPD (chronic obstructive pulmonary disease) (HCC)    Depression    GERD (gastroesophageal reflux disease)    Osteoarthritis    PTSD (post-traumatic stress disorder)    Scoliosis    Past Surgical History:  Procedure Laterality Date   APPENDECTOMY     Family History  Problem Relation Age of Onset   Cancer Mother        bladder   Cancer Father        mesothelioma   Social History   Socioeconomic History   Marital status: Single    Spouse name: Not on file   Number of children: Not on file   Years of education: Not on file   Highest education level: Not on file  Occupational History   Not on file  Tobacco Use   Smoking status: Every Day    Current packs/day: 1.00    Average packs/day: 1 pack/day for 15.0 years (15.0 ttl pk-yrs)    Types: Cigarettes   Smokeless tobacco: Never   Tobacco comments:  Refused cessation materials  Substance and Sexual Activity   Alcohol  use: Yes    Alcohol /week: 7.0 standard drinks of alcohol     Types: 7 Cans of beer per week    Comment: reports is an alcoholic... 40oz beer every other day   Drug use: No    Comment: Denies use   Sexual activity: Yes  Other Topics Concern   Not on file  Social History Narrative   Not on file   Social Drivers of Health   Financial Resource Strain: Medium Risk (07/01/2024)   Overall Financial Resource Strain (CARDIA)    Difficulty of Paying Living Expenses: Somewhat hard  Food Insecurity: No Food Insecurity (07/01/2024)   Hunger Vital Sign    Worried About Running Out of Food in the Last Year: Never true    Ran Out of Food in the Last Year: Never true  Transportation Needs: Unmet  Transportation Needs (07/01/2024)   PRAPARE - Transportation    Lack of Transportation (Medical): Yes    Lack of Transportation (Non-Medical): Yes  Physical Activity: Sufficiently Active (07/01/2024)   Exercise Vital Sign    Days of Exercise per Week: 7 days    Minutes of Exercise per Session: 30 min  Stress: No Stress Concern Present (07/01/2024)   [employer] of Occupational Health - Occupational Stress Questionnaire    Feeling of Stress: Only a little  Social Connections: Unknown (07/01/2024)   Social Connection and Isolation Panel    Frequency of Communication with Friends and Family: Three times a week    Frequency of Social Gatherings with Friends and Family: Three times a week    Attends Religious Services: Not on file    Active Member of Clubs or Organizations: No    Attends Banker Meetings: Never    Marital Status: Never married    Tobacco Counseling Ready to quit: Not Answered Counseling given: Not Answered Tobacco comments: Refused cessation materials   Clinical Intake:  Pre-visit preparation completed: No  Pain : No/denies pain     BMI - recorded: 41.17 Nutritional Status: BMI > 30  Obese Nutritional Risks: None Diabetes: Yes CBG done?: No Did pt. bring in CBG monitor from home?: No  How often do you need to have someone help you when you read instructions, pamphlets, or other written materials from your doctor or pharmacy?: 1 - Never What is the last grade level you completed in school?: some community college  Interpreter Needed?: No      Activities of Daily Living    07/01/2024   11:16 AM  In your present state of health, do you have any difficulty performing the following activities:  Hearing? 0  Vision? 0  Difficulty concentrating or making decisions? 0  Walking or climbing stairs? 1  Dressing or bathing? 0  Doing errands, shopping? 0  Preparing Food and eating ? N  Using the Toilet? N  In the past six months, have you  accidently leaked urine? N  Do you have problems with loss of bowel control? N  Managing your Medications? N  Managing your Finances? N  Housekeeping or managing your Housekeeping? N    Patient Care Team: Gil Greig BRAVO, NP as PCP - General (Adult Health Nurse Practitioner)  Indicate any recent Medical Services you may have received from other than [facility] providers in the past year (date may be approximate).     Assessment:   This is a routine wellness examination for Dalton Molina.  Hearing/Vision screen Hearing Screening -  Comments:: No hearing concerns. Vision Screening - Comments:: No vision concerns. Patient wears prescription glasses. Patient last eye exam was between February/March   Goals Addressed             This Visit's Progress    Weight (lb) < 200 lb (90.7 kg)   278 lb 12.8 oz (126.5 kg)      Depression Screen    07/01/2024   11:01 AM 04/02/2024    3:11 PM  PHQ 2/9 Scores  PHQ - 2 Score 0 1    Fall Risk    07/01/2024   11:01 AM 04/02/2024    3:11 PM  Fall Risk   Falls in the past year? 0 0  Number falls in past yr: 0 0  Injury with Fall? 0 0  Risk for fall due to : No Fall Risks History of fall(s)  Follow up Falls evaluation completed Falls evaluation completed    MEDICARE RISK AT HOME: Medicare Risk at Home Any stairs in or around the home?: No If so, are there any without handrails?: No Home free of loose throw rugs in walkways, pet beds, electrical cords, etc?: Yes Adequate lighting in your home to reduce risk of falls?: Yes Life alert?: No Use of a cane, walker or w/c?: Yes Grab bars in the bathroom?: Yes Shower chair or bench in shower?: No Elevated toilet seat or a handicapped toilet?: No  TIMED UP AND GO:  Was the test performed?  Yes  Length of time to ambulate 10 feet: <5 sec Gait slow and steady with assistive device    Cognitive Function:        07/01/2024   11:03 AM  6CIT Screen  What Year? 0 points  What month? 0 points  What time?  0 points  Count back from 20 0 points  Months in reverse 0 points  Repeat phrase 2 points  Total Score 2 points    Immunizations Immunization History  Administered Date(s) Administered   Influenza,inj,Quad PF,6+ Mos 09/02/2016   Influenza-Unspecified 09/23/2022   Moderna Sars-Covid-2 Vaccination 02/11/2020, 03/10/2020   PNEUMOCOCCAL CONJUGATE-20 04/29/2024   Pneumococcal Polysaccharide-23 09/02/2016    TDAP status: Due, Education has been provided regarding the importance of this vaccine. Advised may receive this vaccine at local pharmacy or Health Dept. Aware to provide a copy of the vaccination record if obtained from local pharmacy or Health Dept. Verbalized acceptance and understanding.  Flu Vaccine status: Up to date  Pneumococcal vaccine status: Up to date  Covid-19 vaccine status: Declined, Education has been provided regarding the importance of this vaccine but patient still declined. Advised may receive this vaccine at local pharmacy or Health Dept.or vaccine clinic. Aware to provide a copy of the vaccination record if obtained from local pharmacy or Health Dept. Verbalized acceptance and understanding.  Qualifies for Shingles Vaccine? Yes   Zostavax completed Yes   Shingrix Completed?: Yes  Screening Tests Health Maintenance  Topic Date Due   DTaP/Tdap/Td (1 - Tdap) Never done   Hepatitis B Vaccines 19-59 Average Risk (1 of 3 - 19+ 3-dose series) Never done   Colonoscopy  Never done   Zoster Vaccines- Shingrix (1 of 2) Never done   COVID-19 Vaccine (3 - 2024-25 season) 07/20/2023   INFLUENZA VACCINE  06/18/2024   Diabetic kidney evaluation - eGFR measurement  04/01/2025   Diabetic kidney evaluation - Urine ACR  04/29/2025   Medicare Annual Wellness (AWV)  07/01/2025   Pneumococcal Vaccine: 50+ Years  Completed  Hepatitis C Screening  Completed   HIV Screening  Completed   HPV VACCINES  Aged Out   Meningococcal B Vaccine  Aged Out   Pneumococcal Vaccine   Discontinued    Health Maintenance  Health Maintenance Due  Topic Date Due   DTaP/Tdap/Td (1 - Tdap) Never done   Hepatitis B Vaccines 19-59 Average Risk (1 of 3 - 19+ 3-dose series) Never done   Colonoscopy  Never done   Zoster Vaccines- Shingrix (1 of 2) Never done   COVID-19 Vaccine (3 - 2024-25 season) 07/20/2023   INFLUENZA VACCINE  06/18/2024    Colorectal cancer screening: Referral to GI placed cologuard ordered> refused colonscopy. Pt aware the office will call re: appt.  Lung Cancer Screening: (Low Dose CT Chest recommended if Age 46-80 years, 20 pack-year currently smoking OR have quit w/in 15years.) does qualify.   Lung Cancer Screening Referral: orders placed with [facility]  Additional Screening:  Hepatitis C Screening: does not qualify; Completed   Vision Screening: Recommended annual ophthalmology exams for early detection of glaucoma and other disorders of the eye. Is the patient up to date with their annual eye exam?  Yes  Who is the provider or what is the name of the office in which the patient attends annual eye exams? No recent provider If pt is not established with a provider, would they like to be referred to a provider to establish care? No .   Dental Screening: Recommended annual dental exams for proper oral hygiene  Diabetic Foot Exam: Diabetic Foot Exam: Completed 07/01/2024  Community Resource Referral / Chronic Care Management: CRR required this visit?  No   CCM required this visit?  No     Plan:     I have personally reviewed and noted the following in the patient's chart:   Medical and social history Use of alcohol , tobacco or illicit drugs  Current medications and supplements including opioid prescriptions. Patient is not currently taking opioid prescriptions. Functional ability and status Nutritional status Physical activity Advanced directives List of other physicians Hospitalizations, surgeries, and ER visits in previous  12 months Vitals Screenings to include cognitive, depression, and falls Referrals and appointments  In addition, I have reviewed and discussed with patient certain preventive protocols, quality metrics, and best practice recommendations. A written personalized care plan for preventive services as well as general preventive health recommendations were provided to patient.     Greig FORBES Cluster, NP   07/01/2024   After Visit Summary: (MyChart) Due to this being a telephonic visit, the after visit summary with patients personalized plan was offered to patient via MyChart   Nurse Notes: 6CIT score 2. Needs to schedule CT chest due to long smoking history. Cologuard ordered. Does not want covid vaccines. Recommend flu vaccine before November 1st. Recommend Tdap and shingles at local pharmacy.

## 2024-07-01 NOTE — Patient Instructions (Addendum)
  Dalton Molina , Thank you for taking time to come for your Medicare Wellness Visit. I appreciate your ongoing commitment to your health goals. Please review the following plan we discussed and let me know if I can assist you in the future.   These are the goals we discussed:  Goals      Weight (lb) < 200 lb (90.7 kg)        This is a list of the screening recommended for you and due dates:  Health Maintenance  Topic Date Due   DTaP/Tdap/Td vaccine (1 - Tdap) Never done   Hepatitis B Vaccine (1 of 3 - 19+ 3-dose series) Never done   Colon Cancer Screening  Never done   Zoster (Shingles) Vaccine (1 of 2) Never done   Flu Shot  06/18/2024   Yearly kidney function blood test for diabetes  04/01/2025   Yearly kidney health urinalysis for diabetes  04/29/2025   Medicare Annual Wellness Visit  07/01/2025   Pneumococcal Vaccine for age over 39  Completed   Hepatitis C Screening  Completed   HIV Screening  Completed   HPV Vaccine  Aged Out   Meningitis B Vaccine  Aged Out   Pneumococcal Vaccine  Discontinued   COVID-19 Vaccine  Discontinued   Needs to schedule CT chest due to long smoking history. Cologuard ordered. Does not want covid vaccines. Recommend flu vaccine before November 1st. Recommend Tdap (tetanus) and shingles at local pharmacy.

## 2024-07-08 ENCOUNTER — Other Ambulatory Visit: Payer: Self-pay | Admitting: Orthopedic Surgery

## 2024-07-08 DIAGNOSIS — E118 Type 2 diabetes mellitus with unspecified complications: Secondary | ICD-10-CM

## 2024-07-09 ENCOUNTER — Telehealth: Payer: Self-pay

## 2024-07-09 NOTE — Telephone Encounter (Signed)
 Copied from CRM #[id number]. Topic: General - Other >> Jul 09, 2024 12:30 PM Miquel SAILOR wrote: Reason for CRM: Cologuard (Order [id number]) Patient calling on options for taking or picking up test due to having issues with mailing service to for pick. Confirmed with office only other option would be you bring to location. He stated he can not do so then hung up. [phone]

## 2024-07-15 LAB — COLOGUARD: COLOGUARD: NEGATIVE

## 2024-07-16 ENCOUNTER — Telehealth: Payer: Self-pay

## 2024-07-16 ENCOUNTER — Other Ambulatory Visit: Payer: Self-pay | Admitting: Orthopedic Surgery

## 2024-07-16 ENCOUNTER — Ambulatory Visit: Payer: Self-pay | Admitting: Orthopedic Surgery

## 2024-07-16 DIAGNOSIS — E118 Type 2 diabetes mellitus with unspecified complications: Secondary | ICD-10-CM

## 2024-07-16 DIAGNOSIS — I10 Essential (primary) hypertension: Secondary | ICD-10-CM

## 2024-07-16 MED ORDER — LOSARTAN POTASSIUM 25 MG PO TABS: 25.0000 mg | ORAL_TABLET | Freq: Every day | ORAL | 4 refills | Status: DC

## 2024-07-16 NOTE — Telephone Encounter (Signed)
 Smoking cessation can cause increased cough. Dry cough can also be side effect of lisinopril . Stop taking lisinopril . I will send another medication called losartan  in replacement. Happy Labor Day, Dalton Molina.

## 2024-07-16 NOTE — Telephone Encounter (Signed)
 Patient with dry cough (worse) since starting lisinopril . Patient states he meant to mention before now, however he wanted to see if that was the culprit. Patient states he would like an alternative at this time.  FYI: Patient wanted to be sure Amy knew that he has cut back on smoking   Patient would like a call once new rx sent to [facility]

## 2024-07-20 NOTE — Telephone Encounter (Signed)
 Patient had no question or concerns at this time and will pick up medication from the pharmacy.  Message sent to Gil Greig BRAVO, NP

## 2024-07-20 NOTE — Telephone Encounter (Signed)
 This was sent specifically to me (versus the [facility] clinical pool) and I left at 2 pm on Friday, will forward to to clinical intake assistant today

## 2024-07-28 ENCOUNTER — Ambulatory Visit: Payer: Self-pay

## 2024-07-28 NOTE — Telephone Encounter (Signed)
 FYI Only or Action Required?: Action required by provider: medication request.  Patient was last seen in primary care on 07/01/2024 by Gil Greig BRAVO, NP.  Called Nurse Triage reporting Dental Pain.  Symptoms began today.  Symptoms are: unchanged.  Triage Disposition: See Dentist Within 24 Hours  Patient/caregiver understands and will follow disposition?: No, wishes to speak with PCP         Copied from CRM #[id number]. Topic: Clinical - Red Word Triage >> Jul 28, 2024  2:25 PM Mercer PEDLAR wrote: Red Word that prompted transfer to Nurse Triage: Had to get emergency dental work done today 07/28/24 and is in pain, requesting pain medication.          Reason for Disposition  [1] Face swelling AND [2] no fever  Answer Assessment - Initial Assessment Questions Patient requesting a prescription for the dental pain he is experiencing after having dental work done today. Patient states he is unable to come in for an appointment. Patient advised to contact his dentist to see if they could prescribe something but he requested to see if his PCP could assist. Please advise.     1. LOCATION: Which tooth is hurting?  (e.g., right-side/left-side, upper/lower, front/back)     Bottom right sided 2. ONSET: When did the toothache start?  (e.g., hours, days)      Today after having dental work done  3. SEVERITY: How bad is the toothache?  (Scale 1-10; mild, moderate or severe)     7/10 4. SWELLING: Is there any visible swelling of your face?     Mild  5. OTHER SYMPTOMS: Do you have any other symptoms? (e.g., fever)     No  Protocols used: Toothache-A-AH

## 2024-08-05 ENCOUNTER — Encounter: Payer: Self-pay | Admitting: Orthopedic Surgery

## 2024-08-05 ENCOUNTER — Telehealth: Payer: Self-pay

## 2024-08-05 ENCOUNTER — Ambulatory Visit (INDEPENDENT_AMBULATORY_CARE_PROVIDER_SITE_OTHER): Admitting: Orthopedic Surgery

## 2024-08-05 VITALS — BP 110/82 | HR 94 | Temp 97.2°F | Resp 17 | Ht 69.0 in | Wt 275.6 lb

## 2024-08-05 DIAGNOSIS — Z23 Encounter for immunization: Secondary | ICD-10-CM

## 2024-08-05 DIAGNOSIS — J418 Mixed simple and mucopurulent chronic bronchitis: Secondary | ICD-10-CM

## 2024-08-05 DIAGNOSIS — I10 Essential (primary) hypertension: Secondary | ICD-10-CM | POA: Diagnosis not present

## 2024-08-05 DIAGNOSIS — F1010 Alcohol abuse, uncomplicated: Secondary | ICD-10-CM

## 2024-08-05 DIAGNOSIS — E118 Type 2 diabetes mellitus with unspecified complications: Secondary | ICD-10-CM | POA: Diagnosis not present

## 2024-08-05 DIAGNOSIS — F17210 Nicotine dependence, cigarettes, uncomplicated: Secondary | ICD-10-CM

## 2024-08-05 MED ORDER — SYMBICORT 160-4.5 MCG/ACT IN AERO: 2.0000 | INHALATION_SPRAY | Freq: Every day | RESPIRATORY_TRACT | 2 refills | Status: AC

## 2024-08-05 MED ORDER — ALBUTEROL SULFATE HFA 108 (90 BASE) MCG/ACT IN AERS: 2.0000 | INHALATION_SPRAY | RESPIRATORY_TRACT | 2 refills | Status: AC | PRN

## 2024-08-05 NOTE — Addendum Note (Signed)
 Addended by: Nayeli Calvert E on: 08/05/2024 01:44 PM   Modules accepted: Orders

## 2024-08-05 NOTE — Patient Instructions (Addendum)
 You may swish with salt water  Continue weight loss efforts   Limit carbs in diet to one serving daily

## 2024-08-05 NOTE — Telephone Encounter (Signed)
 Patients pharmacy tech called just to clarify patients medication Symbicort  , the pharmacist wanted to make sure the directions regarding his medication were correct. Confirmed with patients pcp [place], Amy E, NP and she stated that it is correct and patient informed that he takes the medication daily.

## 2024-08-05 NOTE — Progress Notes (Signed)
 Careteam: Patient Care Team: Gil Greig BRAVO, NP as PCP - General (Adult Health Nurse Practitioner)  Seen by: Greig Gil, AGNP-C  PLACE OF SERVICE:  [facility]  Advanced Directive information    Allergies  Allergen Reactions   Percocet [Oxycodone-Acetaminophen ] Anaphylaxis and Rash   Latex Other (See Comments)    Hands sweat profusely (gloves)    Chief Complaint  Patient presents with   Medical Management of Chronic Issues    3 month follow up. Discuss the need for Colonoscopy, Hepatitis B Screening, Shingrix vaccine, DTAP vaccine, and Influenza vaccine.     HPI: Patient is a 58 y.o. male seen today for medical management of chronic condition.   Discussed the use of AI scribe software for clinical note transcription with the patient, who gave verbal consent to proceed.  History of Present Illness   Dalton Molina is a 58 year old male with diabetes who presents for a follow-up visit.  He has lost weight from 298 pounds to 275 pounds since May, attributing this to reducing alcohol  intake from daily to once a week and attempting to improve his diet by limiting carbohydrates and sugars, though he finds it challenging to reduce bread consumption. He is making healthier choices, such as eating more watermelon and cantaloupe.He is currently taking metformin  daily.   He recently visited the dentist after a long absence and had a temporary cap placed, with plans for a permanent cap soon. He experienced dental pain but managed it with sleep and pain subsided next day. His dental care was impacted by being homeless for over four years.  He has a history of smoking and is trying to cut down on costs by using a cigarette machine. He experienced a recent episode of losing his voice due to coughing, which improved with a new medication. He remains on albuterol  and symbicort .   He relies on public transportation and has resumed using the bus despite previous fears. He enjoys walking around  places like [employer] for exercise and is considering more physical activity, though he is cautious about safety in his area.  He discussed his family background, including his stepmother's influence on his lifestyle changes. He has a supportive relationship with his cab driver, who encourages him regarding his health results.     Flu vaccine given today.   Cologuard negative 07/09/2024.    Review of Systems:  Review of Systems  Constitutional: Negative.   HENT: Negative.    Eyes: Negative.   Respiratory:  Positive for cough and sputum production. Negative for shortness of breath and wheezing.   Cardiovascular: Negative.   Gastrointestinal: Negative.   Genitourinary: Negative.   Musculoskeletal: Negative.   Skin: Negative.   Neurological: Negative.   Psychiatric/Behavioral:  Positive for substance abuse. Negative for depression and memory loss. The patient is not nervous/anxious and does not have insomnia.     Past Medical History:  Diagnosis Date   Alcoholism /alcohol  abuse    Atrial fibrillation (HCC)    Bipolar disorder (HCC)    COPD (chronic obstructive pulmonary disease) (HCC)    Depression    GERD (gastroesophageal reflux disease)    Osteoarthritis    PTSD (post-traumatic stress disorder)    Scoliosis    Past Surgical History:  Procedure Laterality Date   APPENDECTOMY     Social History:   reports that he has been smoking cigarettes. He has a 15 pack-year smoking history. He has never used smokeless tobacco. He reports current alcohol  use of  about 1.0 standard drink of alcohol  per week. He reports that he does not use drugs.  Family History  Problem Relation Age of Onset   Cancer Mother        bladder   Cancer Father        mesothelioma    Medications: Patient's Medications  New Prescriptions   No medications on file  Previous Medications   ALBUTEROL  (VENTOLIN  HFA) 108 (90 BASE) MCG/ACT INHALER    Inhale 2 puffs into the lungs every 4 (four) hours as needed  for wheezing or shortness of breath.   GINKGO BILOBA PO    Take 1 capsule by mouth daily.   LOSARTAN  (COZAAR ) 25 MG TABLET    Take 1 tablet (25 mg total) by mouth daily.   METFORMIN  (GLUCOPHAGE -XR) 500 MG 24 HR TABLET    Take 1 tablet (500 mg total) by mouth daily with breakfast.   MULTIPLE VITAMIN (MULTIVITAMIN PO)    Take 1 capsule by mouth daily.   SYMBICORT  160-4.5 MCG/ACT INHALER    Inhale 2 puffs into the lungs daily.   THIAMINE  (VITAMIN B-1) 100 MG TABLET    Take 100 mg by mouth daily.  Modified Medications   No medications on file  Discontinued Medications   No medications on file    Physical Exam:  Vitals:   08/05/24 1311  BP: (!) 136/90  Pulse: 94  Resp: 17  Temp: (!) 97.2 F (36.2 C)  SpO2: 97%  Weight: 275 lb 9.6 oz (125 kg)  Height: 5' 9 (1.753 m)   Body mass index is 40.7 kg/m. Wt Readings from Last 3 Encounters:  08/05/24 275 lb 9.6 oz (125 kg)  07/01/24 278 lb 12.8 oz (126.5 kg)  04/29/24 286 lb 9.6 oz (130 kg)    Physical Exam Vitals reviewed.  Constitutional:      Appearance: He is obese.  HENT:     Head: Normocephalic.  Eyes:     General:        Right eye: No discharge.        Left eye: No discharge.  Cardiovascular:     Rate and Rhythm: Normal rate and regular rhythm.     Pulses: Normal pulses.     Heart sounds: Normal heart sounds.  Pulmonary:     Effort: Pulmonary effort is normal.     Breath sounds: Normal breath sounds.  Abdominal:     General: Bowel sounds are normal.     Palpations: Abdomen is soft.  Musculoskeletal:     Cervical back: Neck supple.     Right lower leg: No edema.     Left lower leg: No edema.  Skin:    General: Skin is warm.     Capillary Refill: Capillary refill takes less than 2 seconds.  Neurological:     General: No focal deficit present.     Mental Status: He is alert and oriented to person, place, and time.  Psychiatric:        Mood and Affect: Mood normal.     Labs reviewed: Basic Metabolic  Panel: Recent Labs    04/01/24 1552  NA 140  K 4.6  CL 99  CO2 29  GLUCOSE 149*  BUN 13  CREATININE 1.14  CALCIUM 10.0  TSH 1.99   Liver Function Tests: Recent Labs    04/01/24 1552  AST 33  ALT 37  BILITOT 0.4  PROT 6.8   No results for input(s): LIPASE, AMYLASE in the last 8760 hours.  No results for input(s): AMMONIA in the last 8760 hours. CBC: Recent Labs    04/01/24 1552  WBC 8.2  NEUTROABS 4,141  HGB 16.9  HCT 49.7  MCV 100.0  PLT 225   Lipid Panel: No results for input(s): CHOL, HDL, LDLCALC, TRIG, CHOLHDL, LDLDIRECT in the last 8760 hours. TSH: Recent Labs    04/01/24 1552  TSH 1.99   A1C: Lab Results  Component Value Date   HGBA1C 7.3 (H) 04/01/2024     Assessment/Plan 1. Type II diabetes mellitus with complication (HCC) (Primary) - A1c 7.3 03/2024 - no hypoglycemia - cont metformin  and ARB - continue weight loss efforts - cont low carb diet - Flu vaccine HIGH DOSE PF(Fluzone Trivalent) - Basic Metabolic Panel - Hemoglobin A1c  2. Mixed simple and mucopurulent chronic bronchitis (HCC) - ongoing - continues to smoke cigarettes - albuterol  (VENTOLIN  HFA) 108 (90 Base) MCG/ACT inhaler; Inhale 2 puffs into the lungs every 4 (four) hours as needed for wheezing or shortness of breath.  Dispense: 1 each; Refill: 2 - SYMBICORT  160-4.5 MCG/ACT inhaler; Inhale 2 puffs into the lungs daily.  Dispense: 1 each; Refill: 2  3. Immunization due - Flu vaccine HIGH DOSE PF(Fluzone Trivalent)  4. Primary hypertension - controlled, goal < 140/80 - cont losartan   5. Morbid obesity (HCC) - BMI 40.70 - weight down almost 20 lbs since 03/2024 - recommend exercise 150 min/week - recommend < 1700 calories daily   6. Tobacco dependence due to cigarettes - no plans to quit at this time  7. Chronic alcohol  abuse - admits to drinking beer- does not disclose how many - reports drinking less to promote weight loss   Total time: 35  minutes. Greater than 50% of total time spent doing patient education regarding health maintenance, T2DM, obesity, smoking cessation, alcohol  use and elevated blood pressure including symptom/medication management.    Next appt: 12/02/2024  Greig Cluster, ELNITA  [facility] [phone]

## 2024-08-06 ENCOUNTER — Ambulatory Visit: Payer: Self-pay | Admitting: Orthopedic Surgery

## 2024-08-06 LAB — BASIC METABOLIC PANEL WITH GFR
BUN: 15 mg/dL (ref 7–25)
CO2: 30 mmol/L (ref 20–32)
Calcium: 9.7 mg/dL (ref 8.6–10.3)
Chloride: 101 mmol/L (ref 98–110)
Creat: 1.02 mg/dL (ref 0.70–1.30)
Glucose, Bld: 115 mg/dL — ABNORMAL HIGH (ref 65–99)
Potassium: 4.7 mmol/L (ref 3.5–5.3)
Sodium: 140 mmol/L (ref 135–146)
eGFR: 86 mL/min/1.73m2 (ref >=60)

## 2024-08-06 LAB — HEMOGLOBIN A1C
Hgb A1c MFr Bld: 6.3 % — ABNORMAL HIGH (ref <5.7)
Mean Plasma Glucose: 134 mg/dL
eAG (mmol/L): 7.4 mmol/L

## 2024-10-21 ENCOUNTER — Other Ambulatory Visit: Payer: Self-pay | Admitting: Orthopedic Surgery

## 2024-10-21 DIAGNOSIS — E118 Type 2 diabetes mellitus with unspecified complications: Secondary | ICD-10-CM

## 2024-12-02 ENCOUNTER — Ambulatory Visit: Payer: Self-pay | Admitting: Orthopedic Surgery

## 2024-12-09 ENCOUNTER — Ambulatory Visit: Admitting: Orthopedic Surgery

## 2024-12-09 ENCOUNTER — Telehealth: Payer: Self-pay

## 2024-12-09 NOTE — Telephone Encounter (Signed)
 He can try tylenol  1000 mg 1-3 times daily as needed for pain. Recommend seeking dentist evaluation if pain is severe.

## 2024-12-09 NOTE — Telephone Encounter (Signed)
 Copied from CRM #[id number]. Topic: Clinical - Medical Advice >> Dec 09, 2024  9:30 AM Miquel SAILOR wrote: Reason for CRM: PT is requesting for pain reliever due to a tooth or gum ache. Needs call back [phone]  [facility] - [place], [place] - [address] [place] [address] Phone: [phone] Fax: [phone] Hours: Not open 24 hours

## 2024-12-09 NOTE — Telephone Encounter (Signed)
 Spoke with patient does understand and that he does have an appointment with the dentist next Thursday

## 2024-12-22 ENCOUNTER — Other Ambulatory Visit: Payer: Self-pay | Admitting: Orthopedic Surgery

## 2024-12-22 DIAGNOSIS — I10 Essential (primary) hypertension: Secondary | ICD-10-CM

## 2024-12-22 DIAGNOSIS — E118 Type 2 diabetes mellitus with unspecified complications: Secondary | ICD-10-CM

## 2024-12-23 ENCOUNTER — Ambulatory Visit: Admitting: Orthopedic Surgery

## 2024-12-30 ENCOUNTER — Ambulatory Visit: Admitting: Orthopedic Surgery

## 2025-07-07 ENCOUNTER — Encounter: Payer: Self-pay | Admitting: Orthopedic Surgery
